# Patient Record
Sex: Female | Born: 1935 | Race: Asian | Hispanic: No | Smoking: Never smoker
Health system: Southern US, Community
[De-identification: ages and names within clinical notes are randomized; demographics above are authoritative.]

## PROBLEM LIST (undated history)

## (undated) DIAGNOSIS — E785 Hyperlipidemia, unspecified: Secondary | ICD-10-CM

## (undated) DIAGNOSIS — Z8542 Personal history of malignant neoplasm of other parts of uterus: Secondary | ICD-10-CM

## (undated) DIAGNOSIS — R159 Full incontinence of feces: Secondary | ICD-10-CM

## (undated) DIAGNOSIS — M8580 Other specified disorders of bone density and structure, unspecified site: Secondary | ICD-10-CM

## (undated) DIAGNOSIS — N3941 Urge incontinence: Secondary | ICD-10-CM

## (undated) DIAGNOSIS — N3281 Overactive bladder: Secondary | ICD-10-CM

## (undated) DIAGNOSIS — N3020 Other chronic cystitis without hematuria: Secondary | ICD-10-CM

## (undated) DIAGNOSIS — Z973 Presence of spectacles and contact lenses: Secondary | ICD-10-CM

## (undated) DIAGNOSIS — H409 Unspecified glaucoma: Secondary | ICD-10-CM

## (undated) DIAGNOSIS — M1990 Unspecified osteoarthritis, unspecified site: Secondary | ICD-10-CM

## (undated) DIAGNOSIS — Z923 Personal history of irradiation: Secondary | ICD-10-CM

## (undated) DIAGNOSIS — I10 Essential (primary) hypertension: Secondary | ICD-10-CM

## (undated) HISTORY — DX: Essential (primary) hypertension: I10

## (undated) HISTORY — PX: OTHER SURGICAL HISTORY: SHX169

## (undated) HISTORY — DX: Other specified disorders of bone density and structure, unspecified site: M85.80

---

## 1988-06-04 HISTORY — PX: TOTAL ABDOMINAL HYSTERECTOMY W/ BILATERAL SALPINGOOPHORECTOMY: SHX83

## 1989-02-02 HISTORY — PX: CATARACT EXTRACTION W/ INTRAOCULAR LENS  IMPLANT, BILATERAL: SHX1307

## 1993-06-04 HISTORY — PX: PATELLA FRACTURE SURGERY: SHX735

## 1997-12-21 ENCOUNTER — Ambulatory Visit (HOSPITAL_COMMUNITY): Admission: RE | Admit: 1997-12-21 | Discharge: 1997-12-21 | Payer: Self-pay | Admitting: Gastroenterology

## 1999-05-23 ENCOUNTER — Other Ambulatory Visit: Admission: RE | Admit: 1999-05-23 | Discharge: 1999-05-23 | Payer: Self-pay | Admitting: Gynecology

## 1999-05-30 ENCOUNTER — Encounter: Payer: Self-pay | Admitting: Gynecology

## 1999-05-30 ENCOUNTER — Encounter: Admission: RE | Admit: 1999-05-30 | Discharge: 1999-05-30 | Payer: Self-pay | Admitting: Gynecology

## 2000-05-30 ENCOUNTER — Encounter: Admission: RE | Admit: 2000-05-30 | Discharge: 2000-05-30 | Payer: Self-pay | Admitting: Gynecology

## 2000-05-30 ENCOUNTER — Encounter: Payer: Self-pay | Admitting: Gynecology

## 2000-06-20 ENCOUNTER — Other Ambulatory Visit: Admission: RE | Admit: 2000-06-20 | Discharge: 2000-06-20 | Payer: Self-pay | Admitting: Gynecology

## 2000-11-25 ENCOUNTER — Ambulatory Visit (HOSPITAL_COMMUNITY): Admission: RE | Admit: 2000-11-25 | Discharge: 2000-11-25 | Payer: Self-pay | Admitting: Gastroenterology

## 2001-06-05 ENCOUNTER — Encounter: Payer: Self-pay | Admitting: Gynecology

## 2001-06-05 ENCOUNTER — Encounter: Admission: RE | Admit: 2001-06-05 | Discharge: 2001-06-05 | Payer: Self-pay | Admitting: Gynecology

## 2001-06-11 ENCOUNTER — Encounter: Admission: RE | Admit: 2001-06-11 | Discharge: 2001-06-11 | Payer: Self-pay | Admitting: Gynecology

## 2001-06-11 ENCOUNTER — Encounter: Payer: Self-pay | Admitting: Gynecology

## 2001-06-17 ENCOUNTER — Encounter: Payer: Self-pay | Admitting: Family Medicine

## 2001-06-17 ENCOUNTER — Encounter: Admission: RE | Admit: 2001-06-17 | Discharge: 2001-06-17 | Payer: Self-pay | Admitting: Family Medicine

## 2001-07-25 ENCOUNTER — Other Ambulatory Visit: Admission: RE | Admit: 2001-07-25 | Discharge: 2001-07-25 | Payer: Self-pay | Admitting: Gynecology

## 2002-06-16 ENCOUNTER — Encounter: Payer: Self-pay | Admitting: Gynecology

## 2002-06-16 ENCOUNTER — Encounter: Admission: RE | Admit: 2002-06-16 | Discharge: 2002-06-16 | Payer: Self-pay | Admitting: Gynecology

## 2002-08-03 ENCOUNTER — Other Ambulatory Visit: Admission: RE | Admit: 2002-08-03 | Discharge: 2002-08-03 | Payer: Self-pay | Admitting: Gynecology

## 2003-07-06 ENCOUNTER — Encounter: Admission: RE | Admit: 2003-07-06 | Discharge: 2003-07-06 | Payer: Self-pay | Admitting: Gynecology

## 2003-09-08 ENCOUNTER — Other Ambulatory Visit: Admission: RE | Admit: 2003-09-08 | Discharge: 2003-09-08 | Payer: Self-pay | Admitting: Gynecology

## 2004-07-10 ENCOUNTER — Encounter: Admission: RE | Admit: 2004-07-10 | Discharge: 2004-07-10 | Payer: Self-pay | Admitting: Gynecology

## 2004-09-11 ENCOUNTER — Other Ambulatory Visit: Admission: RE | Admit: 2004-09-11 | Discharge: 2004-09-11 | Payer: Self-pay | Admitting: Gynecology

## 2005-08-14 ENCOUNTER — Encounter: Admission: RE | Admit: 2005-08-14 | Discharge: 2005-08-14 | Payer: Self-pay | Admitting: Gynecology

## 2005-11-28 ENCOUNTER — Other Ambulatory Visit: Admission: RE | Admit: 2005-11-28 | Discharge: 2005-11-28 | Payer: Self-pay | Admitting: Gynecology

## 2006-08-21 ENCOUNTER — Encounter: Admission: RE | Admit: 2006-08-21 | Discharge: 2006-08-21 | Payer: Self-pay | Admitting: Gynecology

## 2007-01-02 ENCOUNTER — Other Ambulatory Visit: Admission: RE | Admit: 2007-01-02 | Discharge: 2007-01-02 | Payer: Self-pay | Admitting: Gynecology

## 2007-11-14 ENCOUNTER — Encounter: Admission: RE | Admit: 2007-11-14 | Discharge: 2007-11-14 | Payer: Self-pay | Admitting: Neurosurgery

## 2008-01-08 ENCOUNTER — Encounter: Admission: RE | Admit: 2008-01-08 | Discharge: 2008-01-08 | Payer: Self-pay | Admitting: Gynecology

## 2008-06-04 HISTORY — PX: GLAUCOMA SURGERY: SHX656

## 2009-02-03 ENCOUNTER — Encounter: Admission: RE | Admit: 2009-02-03 | Discharge: 2009-02-03 | Payer: Self-pay | Admitting: Gynecology

## 2009-11-01 ENCOUNTER — Encounter: Admission: RE | Admit: 2009-11-01 | Discharge: 2009-11-01 | Payer: Self-pay | Admitting: Family Medicine

## 2010-02-14 ENCOUNTER — Encounter: Admission: RE | Admit: 2010-02-14 | Discharge: 2010-02-14 | Payer: Self-pay | Admitting: Gynecology

## 2010-03-18 ENCOUNTER — Encounter: Admission: RE | Admit: 2010-03-18 | Discharge: 2010-03-18 | Payer: Self-pay | Admitting: Neurosurgery

## 2010-06-25 ENCOUNTER — Encounter: Payer: Self-pay | Admitting: Gynecology

## 2011-01-15 ENCOUNTER — Other Ambulatory Visit: Payer: Self-pay | Admitting: Gynecology

## 2011-01-15 DIAGNOSIS — Z1231 Encounter for screening mammogram for malignant neoplasm of breast: Secondary | ICD-10-CM

## 2011-02-19 ENCOUNTER — Ambulatory Visit
Admission: RE | Admit: 2011-02-19 | Discharge: 2011-02-19 | Disposition: A | Discharge status: Home / Self Care | Payer: Medicare Other | Source: Ambulatory Visit | Attending: Gynecology | Admitting: Gynecology

## 2011-02-19 DIAGNOSIS — Z1231 Encounter for screening mammogram for malignant neoplasm of breast: Secondary | ICD-10-CM

## 2011-12-14 DIAGNOSIS — R152 Fecal urgency: Secondary | ICD-10-CM | POA: Insufficient documentation

## 2011-12-14 DIAGNOSIS — N3946 Mixed incontinence: Secondary | ICD-10-CM | POA: Insufficient documentation

## 2011-12-14 DIAGNOSIS — R159 Full incontinence of feces: Secondary | ICD-10-CM | POA: Insufficient documentation

## 2012-01-29 ENCOUNTER — Other Ambulatory Visit: Payer: Self-pay | Admitting: Gynecology

## 2012-01-29 DIAGNOSIS — Z1231 Encounter for screening mammogram for malignant neoplasm of breast: Secondary | ICD-10-CM

## 2012-02-20 ENCOUNTER — Ambulatory Visit: Payer: Medicare Other

## 2012-03-05 ENCOUNTER — Ambulatory Visit
Admission: RE | Admit: 2012-03-05 | Discharge: 2012-03-05 | Disposition: A | Discharge status: Home / Self Care | Payer: Medicare Other | Source: Ambulatory Visit | Attending: Gynecology | Admitting: Gynecology

## 2012-03-05 DIAGNOSIS — Z1231 Encounter for screening mammogram for malignant neoplasm of breast: Secondary | ICD-10-CM

## 2013-02-13 DIAGNOSIS — M542 Cervicalgia: Secondary | ICD-10-CM | POA: Insufficient documentation

## 2013-02-20 ENCOUNTER — Other Ambulatory Visit: Payer: Self-pay

## 2013-02-20 DIAGNOSIS — Z1231 Encounter for screening mammogram for malignant neoplasm of breast: Secondary | ICD-10-CM

## 2013-03-11 ENCOUNTER — Ambulatory Visit
Admission: RE | Admit: 2013-03-11 | Discharge: 2013-03-11 | Disposition: A | Discharge status: Home / Self Care | Payer: Medicare PPO | Source: Ambulatory Visit

## 2013-03-11 DIAGNOSIS — Z1231 Encounter for screening mammogram for malignant neoplasm of breast: Secondary | ICD-10-CM

## 2013-03-26 ENCOUNTER — Ambulatory Visit (INDEPENDENT_AMBULATORY_CARE_PROVIDER_SITE_OTHER): Payer: Medicare PPO | Admitting: Gynecology

## 2013-03-26 ENCOUNTER — Encounter: Payer: Self-pay | Admitting: Gynecology

## 2013-03-26 ENCOUNTER — Other Ambulatory Visit (HOSPITAL_COMMUNITY)
Admission: RE | Admit: 2013-03-26 | Discharge: 2013-03-26 | Disposition: A | Discharge status: Home / Self Care | Payer: Medicare PPO | Source: Ambulatory Visit | Attending: Gynecology | Admitting: Gynecology

## 2013-03-26 VITALS — BP 124/76 | Ht 61.0 in | Wt 126.0 lb

## 2013-03-26 DIAGNOSIS — K6289 Other specified diseases of anus and rectum: Secondary | ICD-10-CM

## 2013-03-26 DIAGNOSIS — M899 Disorder of bone, unspecified: Secondary | ICD-10-CM

## 2013-03-26 DIAGNOSIS — M8580 Other specified disorders of bone density and structure, unspecified site: Secondary | ICD-10-CM

## 2013-03-26 DIAGNOSIS — Z124 Encounter for screening for malignant neoplasm of cervix: Secondary | ICD-10-CM | POA: Insufficient documentation

## 2013-03-26 DIAGNOSIS — Z1272 Encounter for screening for malignant neoplasm of vagina: Secondary | ICD-10-CM

## 2013-03-26 DIAGNOSIS — N816 Rectocele: Secondary | ICD-10-CM

## 2013-03-26 DIAGNOSIS — N952 Postmenopausal atrophic vaginitis: Secondary | ICD-10-CM

## 2013-03-26 DIAGNOSIS — K627 Radiation proctitis: Secondary | ICD-10-CM

## 2013-03-26 NOTE — Progress Notes (Signed)
Janet Cooper Sep 09, 1929 [id number]        79 y.o.  G1P1 new patient for followup exam.  Several issues noted below. Former patient Dr. Nicholas Lose  Past medical history,surgical history, medications, allergies, family history and social history were all reviewed and documented in the EPIC chart.  ROS:  Performed and pertinent positives and negatives are included in the history, assessment and plan .  Exam: Kim assistant Filed Vitals:   03/26/13 1124  BP: 124/76  Height: 5\' 1"  (1.549 m)  Weight: 126 lb (57.153 kg)   General appearance  Normal Skin grossly normal Head/Neck normal with no cervical or supraclavicular adenopathy thyroid normal Lungs  clear Cardiac RR, without RMG Abdominal  soft, nontender, without masses, organomegaly or hernia Breasts  examined lying and sitting without masses, retractions, discharge or axillary adenopathy. Pelvic  Ext/BUS/vagina  atrophic changes with foreshortened vagina. Mild rectocele noted  Adnexa  Without masses or tenderness    Anus and perineum  normal   Rectovaginal  normal sphincter tone without palpated masses or tenderness.    Assessment/Plan:  79 y.o. G1P1 new patient   1. Patient with vague history of cancer treatment initially with surgery 1989 and subsequently 5 years later resulting in radiation treatment. York Spaniel it was a cancer outside her ovary. Did write down a name endometrial cancer. It is unclear otherwise about the diagnosis. She's also unclear whether she still has her ovaries or not. She is status post hysterectomy in the past. Question whether she had initial endometrial carcinoma treated surgically and then had a vaginal cuff recurrence leading to radiation possibly 5 years later. Will obtain records from Dr. Nicholas Lose to see if we can clarify this history. Pap smear of her cuff was done today. Exam shows a shortened vagina with atrophic changes but NED. 2. Rectocele. Patient has a mild rectocele on exam. Asymptomatic to the  patient. 3. Radiation proctitis. It sounds historically like she's having radiation bowel changes were she's having frequent liquid to small stooling difficulty maintaining continence. She did undergo sphincter testing at [facility] which overall was which showed some dyskinetic changes but overall normal. She does have adequate sphincter tone on exam today. She reports being told by her gastroenterologist that nothing more could be done for her. I discussed with her that I would recommend either seeing a different gastroenterologist or possibly a radiation oncologist to see what can be done possibly to help treat her. I will plan to help arrange an appointment with the radiation oncologist to see what they can suggest. 4. Osteopenia/porosis. Patient relates having a bone density at Dr. Johnn Hai office 5 years ago where she was told she had weak bones and is on no medication. Will obtain a copy of this I recommended baseline DEXA now and she will schedule this. 5. Mammography 03/2013. Continue with annual mammography. 6. Pap smear done today as noted above. 7. Colonoscopy 8 years ago with recommended repeat at 10 year interval. 8. Health maintenance. The blood work done as this is done through her primary physician's office. I will try to obtain records from Dr. Johnn Hai office otherwise patient will followup in one year, sooner as needed.    Note: This document was prepared with digital dictation and possible smart phrase technology. Any transcriptional errors that result from this process are unintentional.   Dara Lords MD, 12:28 PM 03/26/2013

## 2013-03-26 NOTE — Patient Instructions (Signed)
Followup for bone density as scheduled. Office will call you to help arrange a follow up appointment with the other doctor in reference to your bowels

## 2013-03-26 NOTE — Addendum Note (Signed)
Addended by: Dayna Barker on: 03/26/2013 12:49 PM   Modules accepted: Orders

## 2013-03-27 LAB — URINALYSIS W MICROSCOPIC + REFLEX CULTURE
Bilirubin Urine: NEGATIVE
Casts: NONE SEEN
Crystals: NONE SEEN
Glucose, UA: NEGATIVE mg/dL
Ketones, ur: NEGATIVE mg/dL
Nitrite: NEGATIVE
Protein, ur: NEGATIVE mg/dL
Specific Gravity, Urine: 1.01 (ref 1.005–1.030)
Urobilinogen, UA: 0.2 mg/dL (ref 0.0–1.0)
pH: 5.5 (ref 5.0–8.0)

## 2013-03-29 LAB — URINE CULTURE: Colony Count: >=100000

## 2013-03-30 ENCOUNTER — Other Ambulatory Visit: Payer: Self-pay | Admitting: *Deleted

## 2013-03-30 MED ORDER — CIPROFLOXACIN HCL 250 MG PO TABS: 250.0000 mg | ORAL_TABLET | Freq: Two times a day (BID) | ORAL | Status: DC

## 2013-04-02 ENCOUNTER — Telehealth: Payer: Self-pay | Admitting: *Deleted

## 2013-04-02 DIAGNOSIS — K627 Radiation proctitis: Secondary | ICD-10-CM

## 2013-04-02 NOTE — Telephone Encounter (Signed)
Pt informed with the below note. 

## 2013-04-02 NOTE — Telephone Encounter (Signed)
Left message for pt to call. Appointment on Nov. 6 1:30 pm with Dr.Kinard at [facility].

## 2013-04-02 NOTE — Telephone Encounter (Signed)
Message copied by Aura Camps on Thu Apr 02, 2013  8:58 AM ------      Message from: Keenan Bachelor      Created: Tue Mar 31, 2013  9:18 AM       This is referral. Thanks!      ----- Message -----         From: Dara Lords, MD         Sent: 03/26/2013  12:34 PM           To: Christie Nottingham, see if you can call radiation oncology Department and asked them about this patient. She has a history of radiation treatment to the pelvis a number of years ago done elsewhere I believe and I think she now is having issues with radiation proctitis. She saw a gastroenterologist who really was no help to her and I was thinking that one of the radiation oncologists may have better treatment options to help her. If they have someone who would be willing to see her then help the patient make an appointment. If they feel they cannot help her then I suggest an appointment with Dr. Charlotte Sanes at [facility] gastroenterology       ------

## 2013-04-06 ENCOUNTER — Encounter: Payer: Self-pay | Admitting: Gynecology

## 2013-04-08 ENCOUNTER — Telehealth: Payer: Self-pay | Admitting: *Deleted

## 2013-04-08 NOTE — Telephone Encounter (Signed)
Clydie Braun from Dr.Kinard office called stating that Dr.kinard reviewed pt chart and said pt would better seeing a GI doctor, I will make referral for Dr. Charlotte Sanes at [facility] gastroenterology as directed by TF.

## 2013-04-09 ENCOUNTER — Ambulatory Visit: Payer: Medicare PPO

## 2013-04-09 ENCOUNTER — Ambulatory Visit: Payer: Medicare PPO | Admitting: Radiation Oncology

## 2013-04-09 NOTE — Telephone Encounter (Signed)
Spoke with Toni Amend at [facility] and Dr.Buccnini is no longer accepting new patients, TF said okay with any GI at [facility] except Dr.Ganman. Courtney asked if we had copy of recent colonoscopy, I told her we do not have this information. I gave her pt daughter # to call to see if records could be sent. Toni Amend is going to call me back once she speaks with pt daughter who has DPR access.

## 2013-04-10 NOTE — Telephone Encounter (Signed)
I was informed by patient that she saw Dr.medoff, eagle will need records from [facility] office before making appointment with new GI Md. Pt will have records sent to [facility] one of there physician will review records to determine if they can help pt with problem before making appointment. Pt is aware with all the above.

## 2013-05-04 DIAGNOSIS — M8580 Other specified disorders of bone density and structure, unspecified site: Secondary | ICD-10-CM

## 2013-05-04 HISTORY — DX: Other specified disorders of bone density and structure, unspecified site: M85.80

## 2013-05-12 ENCOUNTER — Encounter: Payer: Self-pay | Admitting: Gynecology

## 2013-05-12 ENCOUNTER — Ambulatory Visit (INDEPENDENT_AMBULATORY_CARE_PROVIDER_SITE_OTHER): Payer: Medicare PPO

## 2013-05-12 DIAGNOSIS — M899 Disorder of bone, unspecified: Secondary | ICD-10-CM

## 2013-05-12 DIAGNOSIS — M8580 Other specified disorders of bone density and structure, unspecified site: Secondary | ICD-10-CM

## 2013-05-15 NOTE — Telephone Encounter (Signed)
shawna called from [facility] physician regarding the below and said that 2 physician have declined to see patient. Stating that they would not be able to help pt. She said that Dr. Laural Benes states pt should be seen by one of the [facility] physician. I was unable to let pt know about this information because pt does not have a voicemail to leave message.

## 2013-05-18 NOTE — Telephone Encounter (Signed)
(  Late entry 05/15/13)  Pt was informed with the below note, pt decided she would just rather continue care with [facility] office being that he is her originally GI MD. I called Dr.Medoff office to inform office of this information and was told that pt has been discharged from practice. I explained to front desk that this was a second opinion only, I was informed that Dr.Meoff will be informed with this only being second opinion and to see if pt continue seeing MD. I will wait for return call from office.

## 2013-05-19 NOTE — Telephone Encounter (Signed)
Westly Pam from [facility] called back after speaking with him personally regarding the below note. He said that once a pt has been discharged from practice there is no return. Westly Pam explained to MD it was more so a second opinion, MD still declined. Pt was informed with this a well, she is going to consult with her PCP MD to help with GI issues if possible. Pt informed if any problem. She once again declined to see physician at [facility] stating the drive is too long and she doesn't want to do the testing [facility] asked.

## 2013-06-03 ENCOUNTER — Emergency Department (HOSPITAL_COMMUNITY): Payer: Medicare PPO

## 2013-06-03 ENCOUNTER — Emergency Department (HOSPITAL_COMMUNITY)
Admission: EM | Admit: 2013-06-03 | Discharge: 2013-06-03 | Disposition: A | Discharge status: Home / Self Care | Payer: Medicare PPO | Attending: Emergency Medicine | Admitting: Emergency Medicine

## 2013-06-03 ENCOUNTER — Encounter (HOSPITAL_COMMUNITY): Payer: Self-pay | Admitting: Emergency Medicine

## 2013-06-03 DIAGNOSIS — Z8542 Personal history of malignant neoplasm of other parts of uterus: Secondary | ICD-10-CM | POA: Insufficient documentation

## 2013-06-03 DIAGNOSIS — Z8544 Personal history of malignant neoplasm of other female genital organs: Secondary | ICD-10-CM | POA: Insufficient documentation

## 2013-06-03 DIAGNOSIS — M899 Disorder of bone, unspecified: Secondary | ICD-10-CM | POA: Insufficient documentation

## 2013-06-03 DIAGNOSIS — S4980XA Other specified injuries of shoulder and upper arm, unspecified arm, initial encounter: Secondary | ICD-10-CM | POA: Insufficient documentation

## 2013-06-03 DIAGNOSIS — Y9389 Activity, other specified: Secondary | ICD-10-CM | POA: Insufficient documentation

## 2013-06-03 DIAGNOSIS — E7800 Pure hypercholesterolemia, unspecified: Secondary | ICD-10-CM | POA: Insufficient documentation

## 2013-06-03 DIAGNOSIS — Y92009 Unspecified place in unspecified non-institutional (private) residence as the place of occurrence of the external cause: Secondary | ICD-10-CM | POA: Insufficient documentation

## 2013-06-03 DIAGNOSIS — Z79899 Other long term (current) drug therapy: Secondary | ICD-10-CM | POA: Insufficient documentation

## 2013-06-03 DIAGNOSIS — S46909A Unspecified injury of unspecified muscle, fascia and tendon at shoulder and upper arm level, unspecified arm, initial encounter: Secondary | ICD-10-CM | POA: Insufficient documentation

## 2013-06-03 DIAGNOSIS — Z7982 Long term (current) use of aspirin: Secondary | ICD-10-CM | POA: Insufficient documentation

## 2013-06-03 DIAGNOSIS — M25511 Pain in right shoulder: Secondary | ICD-10-CM

## 2013-06-03 DIAGNOSIS — W010XXA Fall on same level from slipping, tripping and stumbling without subsequent striking against object, initial encounter: Secondary | ICD-10-CM | POA: Insufficient documentation

## 2013-06-03 DIAGNOSIS — W19XXXA Unspecified fall, initial encounter: Secondary | ICD-10-CM

## 2013-06-03 DIAGNOSIS — I10 Essential (primary) hypertension: Secondary | ICD-10-CM | POA: Insufficient documentation

## 2013-06-03 MED ORDER — IBUPROFEN 600 MG PO TABS: 600.0000 mg | ORAL_TABLET | Freq: Three times a day (TID) | ORAL | Status: DC | PRN

## 2013-06-03 MED ORDER — IBUPROFEN 200 MG PO TABS
600.0000 mg | ORAL_TABLET | Freq: Once | ORAL | Status: AC
  Administered 2013-06-03: 600 mg via ORAL
  Filled 2013-06-03: qty 3

## 2013-06-03 NOTE — ED Provider Notes (Signed)
CSN: [id number]     Arrival date & time 06/03/13  1343 History  This chart was scribed for non-physician practitioner, Trixie Dredge, PA-C working with Rolan Bucco, MD by Greggory Stallion, ED scribe. This patient was seen in room WTR5/WTR5 and the patient's care was started at 4:22 PM.   Chief Complaint  Patient presents with  . Shoulder Pain   The history is provided by the patient. No language interpreter was used.   HPI Comments: Janet Cooper is a 79 y.o. female who presents to the Emergency Department complaining of a fall that occurred earlier today around 10 AM. States she tripped over a cord, fell and landed on her right shoulder. Denies hitting her head or LOC. She has sudden onset right shoulder pain. Pt rates the pain 7/10. Certain movements worsen the pain. Denies chest pain, abdominal pain, hip pain, elbow pain, neck pain, back pain, weakness or numbness in hand.   Past Medical History  Diagnosis Date  . Hypertension   . Elevated cholesterol   . Osteopenia 05/2013    T score -2.1 FRAX 8.9%/2.5%  . Endometrial cancer 1990    Stage IB grade 1 TAH BSO  . Vaginal cancer 1994    Vaginal cuff recurrence of endometrial cancer status post radiation treatment   Past Surgical History  Procedure Laterality Date  . Patella fracture surgery    . Cataract extraction    . Abdominal hysterectomy     Family History  Problem Relation Age of Onset  . Colon cancer Mother   . Cancer Father     Liver cancer  . Heart disease Maternal Grandmother   . Heart disease Maternal Grandfather    History  Substance Use Topics  . Smoking status: Never Smoker   . Smokeless tobacco: Not on file  . Alcohol Use: 0.5 oz/week    1 drink(s) per week     Comment: Occas   OB History   Grav Para Term Preterm Abortions TAB SAB Ect Mult Living   1 1        1      Review of Systems  Cardiovascular: Negative for chest pain.  Gastrointestinal: Negative for abdominal pain.  Musculoskeletal: Positive  for arthralgias. Negative for back pain and neck pain.  Neurological: Negative for weakness and numbness.  All other systems reviewed and are negative.    Allergies  Hydrocodone and Sulfa antibiotics  Home Medications   Current Outpatient Rx  Name  Route  Sig  Dispense  Refill  . amLODipine-olmesartan (AZOR) 5-40 MG per tablet   Oral   Take 1 tablet by mouth daily.         Marland Kitchen aspirin 81 MG tablet   Oral   Take 81 mg by mouth daily.         . calcium-vitamin D (OSCAL WITH D) 500-200 MG-UNIT per tablet   Oral   Take 1 tablet by mouth 2 (two) times daily.         . cholecalciferol (VITAMIN D) 1000 UNITS tablet   Oral   Take 2,000 Units by mouth daily.         Marland Kitchen glucosamine-chondroitin 500-400 MG tablet   Oral   Take 1 tablet by mouth 2 (two) times daily.         Marland Kitchen latanoprost (XALATAN) 0.005 % ophthalmic solution      1 drop at bedtime.         . meloxicam (MOBIC) 7.5 MG tablet   Oral  Take 7.5 mg by mouth as needed for pain.          . Omega-3 Fatty Acids (FISH OIL PO)   Oral   Take 1 g by mouth 2 (two) times daily.          . simvastatin (ZOCOR) 20 MG tablet   Oral   Take 20 mg by mouth every evening.         . timolol (BETIMOL) 0.5 % ophthalmic solution      1 drop 2 (two) times daily.          BP 134/84  Pulse 67  Temp(Src) 98.6 F (37 C) (Oral)  Resp 20  SpO2 95%  Physical Exam  Nursing note and vitals reviewed. Constitutional: She appears well-developed and well-nourished. No distress.  HENT:  Head: Normocephalic and atraumatic.  Neck: Neck supple.  Cardiovascular: Intact distal pulses.   Capillary refill less than 2 seconds.   Pulmonary/Chest: Effort normal.  Musculoskeletal:  Full active ROM of right shoulder and elbow. Spine nontender, no crepitus, or stepoffs. Pain with internal rotation of right shoulder. Pain with adduction.   Neurological: She is alert.  Sensation intact.  Skin: She is not diaphoretic.    ED  Course  Procedures (including critical care time)  DIAGNOSTIC STUDIES: Oxygen Saturation is 95% on RA, adequate by my interpretation.    COORDINATION OF CARE: 4:28 PM-Discussed treatment plan which includes an anti-inflammatory and ice packs with pt at bedside and pt agreed to plan.   Labs Review Labs Reviewed - No data to display Imaging Review Dg Shoulder Right  06/03/2013   CLINICAL DATA:  Fall with right shoulder injury.  EXAM: RIGHT SHOULDER - 2+ VIEW  COMPARISON:  None.  FINDINGS: There is no evidence of fracture or dislocation. There is no evidence of arthropathy or other focal bone abnormality. Soft tissues are unremarkable. Subacromial morphology is type 2 (curved).  IMPRESSION: 1. No acute bony findings. If pain persists despite conservative therapy, MRI may be warranted for further characterization.   Electronically Signed   By: Herbie Baltimore M.D.   On: 06/03/2013 15:27    EKG Interpretation   None      Dr Fredderick Phenix made aware of the patient  MDM   1. Fall at home, initial encounter   2. Right shoulder pain    Patient with mechanical fall this morning landing on her right shoulder. Patient denies any other injury. Neurovascularly intact. X-ray shows no fracture. Patient has full active range of motion and has pain only with certain movements. Discharged home with ibuprofen and PCP followup  Discussed all results with patient.  Pt given return precautions.  Pt verbalizes understanding and agrees with plan.     I personally performed the services described in this documentation, which was scribed in my presence. The recorded information has been reviewed and is accurate.   Trixie Dredge, PA-C 06/03/13 1756

## 2013-06-03 NOTE — Progress Notes (Signed)
   CARE MANAGEMENT ED NOTE 06/03/2013  Patient:  Janet Cooper, Janet Cooper   Account Number:  [id number]  Date Initiated:  06/03/2013  Documentation initiated by:  Edd Arbour  Subjective/Objective Assessment:   79 yr old female [employer] choice ppo pt states pcp is Dr Warrick Parisian a [facility] in [place]     Subjective/Objective Assessment Detail:     Action/Plan:   Action/Plan Detail:   Anticipated DC Date:  06/03/2013     Status Recommendation to Physician:   Result of Recommendation:    Other ED Services  Consult Working Plan    DC Planning Services  Other  PCP issues  Outpatient Services - Pt will follow up    Choice offered to / List presented to:            Status of service:  Completed, signed off  ED Comments:   ED Comments Detail:

## 2013-06-03 NOTE — ED Provider Notes (Signed)
Medical screening examination/treatment/procedure(s) were conducted as a shared visit with non-physician practitioner(s) and myself.  I personally evaluated the patient during the encounter.  EKG Interpretation   None       Pt with right shoulder pain s/p mechanical fall.  No evident head injury.  No neck pain other then baseline.   Rolan Bucco, MD 06/03/13 [phone]

## 2013-06-03 NOTE — ED Notes (Addendum)
Pt c/o R shoulder pain after tripping, fall and landing on shoulder.  Pain score 6/10, with activity.  No swelling or deformity noted.  Denies numbness and tingling.  Pt has full ROM, with slow movements.

## 2013-07-14 ENCOUNTER — Ambulatory Visit (INDEPENDENT_AMBULATORY_CARE_PROVIDER_SITE_OTHER): Payer: Medicare PPO | Admitting: General Surgery

## 2013-07-16 ENCOUNTER — Ambulatory Visit (INDEPENDENT_AMBULATORY_CARE_PROVIDER_SITE_OTHER): Payer: Medicare PPO | Admitting: General Surgery

## 2013-07-16 ENCOUNTER — Encounter (INDEPENDENT_AMBULATORY_CARE_PROVIDER_SITE_OTHER): Payer: Self-pay

## 2013-07-16 ENCOUNTER — Encounter (INDEPENDENT_AMBULATORY_CARE_PROVIDER_SITE_OTHER): Payer: Self-pay | Admitting: General Surgery

## 2013-07-16 DIAGNOSIS — R152 Fecal urgency: Secondary | ICD-10-CM

## 2013-07-16 NOTE — Progress Notes (Signed)
Chief Complaint  Patient presents with  . Other    Eval bowel motilty    HISTORY: Janet Cooper is a 80 y.o. female who presents to the office with fecal urgency.  This had been occurring for 6-7 years.  She explain this as a urgency to have bowel movements that is almost constant. She does have a bowel movement she only has a small amount of stool. She also reports a sensation of incomplete evacuation. If she is unable to get to the bathroom in time she does have some fecal leakage.  This affects her lifestyle, as she is unable to leave the house very often.   Nothing makes the symptoms worse.  In the past she has tried fiber therapy and anti-diarrheal medications.  Neither of these have helped.  It is continuous in nature.  Her bowel habits are irregular and her bowel movements are soft.  Her fiber intake is dietary.  Her last colonoscopy was about 8 yrs ago.  She has had 1 vaginal deliveries, which was complicated by a laceration.  She denies any symptoms of external rectal prolapse.  Past Medical History  Diagnosis Date  . Hypertension   . Elevated cholesterol   . Osteopenia 05/2013    T score -2.1 FRAX 8.9%/2.5%  . Endometrial cancer 1990    Stage IB grade 1 TAH BSO  . Vaginal cancer 1994    Vaginal cuff recurrence of endometrial cancer status post radiation treatment      Past Surgical History  Procedure Laterality Date  . Patella fracture surgery    . Cataract extraction    . Abdominal hysterectomy          Current Outpatient Prescriptions  Medication Sig Dispense Refill  . amLODipine-olmesartan (AZOR) 5-40 MG per tablet Take 1 tablet by mouth daily.      Marland Kitchen aspirin 81 MG tablet Take 81 mg by mouth daily.      . calcium-vitamin D (OSCAL WITH D) 500-200 MG-UNIT per tablet Take 1 tablet by mouth 2 (two) times daily.      . cholecalciferol (VITAMIN D) 1000 UNITS tablet Take 2,000 Units by mouth daily.      Marland Kitchen glucosamine-chondroitin 500-400 MG tablet Take 1 tablet by mouth 2  (two) times daily.      Marland Kitchen ibuprofen (ADVIL,MOTRIN) 600 MG tablet Take 1 tablet (600 mg total) by mouth every 8 (eight) hours as needed for mild pain or moderate pain.  15 tablet  0  . latanoprost (XALATAN) 0.005 % ophthalmic solution 1 drop at bedtime.      . Omega-3 Fatty Acids (FISH OIL PO) Take 1 g by mouth 2 (two) times daily.       . simvastatin (ZOCOR) 20 MG tablet Take 20 mg by mouth every evening.      . timolol (BETIMOL) 0.5 % ophthalmic solution 1 drop 2 (two) times daily.      . meloxicam (MOBIC) 7.5 MG tablet Take 7.5 mg by mouth as needed for pain.        No current facility-administered medications for this visit.      Allergies  Allergen Reactions  . Hydrocodone Itching  . Sulfa Antibiotics Itching      Family History  Problem Relation Age of Onset  . Colon cancer Mother   . Cancer Father     Liver cancer  . Heart disease Maternal Grandmother   . Heart disease Maternal Grandfather     History   Social History  .  Marital Status: Widowed    Spouse Name: N/A    Number of Children: N/A  . Years of Education: N/A   Social History Main Topics  . Smoking status: Never Smoker   . Smokeless tobacco: None  . Alcohol Use: 0.5 oz/week    1 drink(s) per week     Comment: Occas  . Drug Use: No  . Sexual Activity: No   Other Topics Concern  . None   Social History Narrative  . None      REVIEW OF SYSTEMS - PERTINENT POSITIVES ONLY: Review of Systems - General ROS: negative for - chills, fever or weight loss Hematological and Lymphatic ROS: negative for - bleeding problems, blood clots or bruising Respiratory ROS: no cough, shortness of breath, or wheezing Cardiovascular ROS: no chest pain or dyspnea on exertion Gastrointestinal ROS: no abdominal pain, change in bowel habits, or black or bloody stools Genito-Urinary ROS: no dysuria, trouble voiding, or hematuria  EXAM: Filed Vitals:   07/16/13 1352  BP: 182/82  Pulse: 76  Resp: 16    General  appearance: alert and cooperative Resp: clear to auscultation bilaterally Cardio: regular rate and rhythm GI: normal findings: soft, non-tender   Procedure: Anoscopy Surgeon: Marcello Moores Diagnosis: fecal urgency  Assistant: Spillers After the risks and benefits were explained, verbal consent was obtained for above procedure  Anesthesia: none Findings: Moderate anal tone, short sphincter complex, moderate squeeze, good push. Upon pushing I did notice tissue prolapsing, concerning for internal rectal prolapse. Internal tissue appears non-inflamed. Small rectocele  Her anal rectal manometry performed last year weight 4 she diversity shows mild sphincter dysfunction. Squeeze pressures were adequate. Resting tone was mildly decreased. She did have some increased push pressures.  ASSESSMENT AND PLAN: Janet Cooper Is an 80 year old female who presents to the office with ongoing problems with urgency. She does have a history of radiation to the pelvis. I did not see any signs of radiation proctitis on exam. Her exam is concerning for an internal rectal prolapse.  I think this would account for some of her urgency issues.  These may also be caused by her rectocele filling with stool, although this seems less likely.  I would like to get an MR Defogram to evaluate this further.  I have recommended that she go back on a fiber supplement. I will see her once her MRI is complete and we will evaluate the need for surgery.    Janet Cooper [facility] / [facility]      Visit Diagnoses: 1. Fecal urgency     Primary Care Physician: Janet Cooper

## 2013-07-16 NOTE — Patient Instructions (Signed)

## 2013-07-27 ENCOUNTER — Telehealth (INDEPENDENT_AMBULATORY_CARE_PROVIDER_SITE_OTHER): Payer: Self-pay

## 2013-07-27 ENCOUNTER — Telehealth (INDEPENDENT_AMBULATORY_CARE_PROVIDER_SITE_OTHER): Payer: Self-pay | Admitting: *Deleted

## 2013-07-27 NOTE — Telephone Encounter (Signed)
I spoke with pt and informed her of the appt for her MRI at [facility] on 08/03/13 with an arrival time of 1:15pm.  She is agreeable with this appt.  This MRI has been pre-authorized valid from 08/03/13-09/02/13 Auth#: [id number]

## 2013-07-27 NOTE — Telephone Encounter (Signed)
Left message on voice mail.  Please inform patient her MRI has been scheduled for 08/03/13 at 1:45pm at [facility].

## 2013-08-03 ENCOUNTER — Ambulatory Visit
Admission: RE | Admit: 2013-08-03 | Discharge: 2013-08-03 | Disposition: A | Discharge status: Home / Self Care | Payer: Medicare PPO | Source: Ambulatory Visit | Attending: General Surgery | Admitting: General Surgery

## 2013-08-07 ENCOUNTER — Telehealth (INDEPENDENT_AMBULATORY_CARE_PROVIDER_SITE_OTHER): Payer: Self-pay | Admitting: *Deleted

## 2013-08-07 NOTE — Telephone Encounter (Signed)
MRI results are available in Epic.  Awaiting review from Dr. Marcello Moores and recommendations.

## 2013-08-09 NOTE — Telephone Encounter (Signed)
We will discuss results at her f/u apt

## 2013-08-11 ENCOUNTER — Encounter (INDEPENDENT_AMBULATORY_CARE_PROVIDER_SITE_OTHER): Payer: Self-pay

## 2013-09-16 ENCOUNTER — Encounter (INDEPENDENT_AMBULATORY_CARE_PROVIDER_SITE_OTHER): Payer: Self-pay | Admitting: General Surgery

## 2013-09-16 ENCOUNTER — Ambulatory Visit (INDEPENDENT_AMBULATORY_CARE_PROVIDER_SITE_OTHER): Payer: Medicare PPO | Admitting: General Surgery

## 2013-09-16 ENCOUNTER — Other Ambulatory Visit (INDEPENDENT_AMBULATORY_CARE_PROVIDER_SITE_OTHER): Payer: Self-pay

## 2013-09-16 VITALS — BP 122/72 | HR 76 | Temp 97.0°F | Resp 14 | Ht 61.0 in | Wt 123.2 lb

## 2013-09-16 DIAGNOSIS — IMO0002 Reserved for concepts with insufficient information to code with codable children: Secondary | ICD-10-CM

## 2013-09-16 DIAGNOSIS — N816 Rectocele: Secondary | ICD-10-CM | POA: Insufficient documentation

## 2013-09-16 NOTE — Progress Notes (Signed)
Janet Cooper is a 80 y.o. female who is here for a follow up visit regarding Her prolapse symptoms. She reports multiple bowel movements throughout the day, which don't really bother her too much. Her main complaint is the feeling of pressure and urgency when she stands. She has tried a fiber supplement for the past couple months without any relief in her bowel habits.  She is also having urinary symptoms with urgency.  Objective: Filed Vitals:   09/16/13 1045  BP: 122/72  Pulse: 76  Temp: 97 F (36.1 C)  Resp: 14    General appearance: alert and cooperative GI: normal findings: soft, non-tender   Assessment and Plan: Janet Cooper is an 80 year old female with pelvic floor laxity. Pelvic MRI shows a moderate rectocele with vaginal prolapse and cystocele as well as a small enterocele. We discussed that repairing her pelvic floor would be difficulty as all compartments seem to be involved. I would like her to see Dr. Matilde Sprang at [facility] urology for evaluation of her anterior compartment issues. I think if he feels that there is something that can be done for anterior compartment we could attempt to close the rectocele and enterocele surgically as well. This may help with her symptoms.    Janet Adie, MD [facility] [place]

## 2013-09-16 NOTE — Patient Instructions (Signed)
We will refer you to the pelvic floor urologist for evaluation of her pelvic floor relapse. Return to my office once you have seen him and he has assessed your urinary problems.

## 2013-11-25 ENCOUNTER — Ambulatory Visit (INDEPENDENT_AMBULATORY_CARE_PROVIDER_SITE_OTHER): Payer: Medicare PPO | Admitting: General Surgery

## 2013-11-25 ENCOUNTER — Encounter (INDEPENDENT_AMBULATORY_CARE_PROVIDER_SITE_OTHER): Payer: Self-pay | Admitting: General Surgery

## 2013-11-25 VITALS — BP 122/76 | HR 77 | Temp 97.5°F | Ht 61.0 in | Wt 123.0 lb

## 2013-11-25 DIAGNOSIS — N816 Rectocele: Secondary | ICD-10-CM

## 2013-11-25 NOTE — Progress Notes (Signed)
Janet Cooper is a 80 y.o. female who is here for a follow up visit regarding Her prolapse symptoms. She reports multiple bowel movements throughout the day, which don't really bother her too much. Her main complaint is the feeling of pressure and urgency when she stands. She has tried a fiber supplement for the past couple months without any relief in her bowel habits. She is also having urinary symptoms with urgency. She saw Dr Jeffie Pollock for this who recommended chronic abx for UTI and pelvic floor PT.  She is scheduled for this next month. Objective:  Filed Vitals:   11/25/13 1535  BP: 122/76  Pulse: 77  Temp: 97.5 F (36.4 C)    General appearance: alert and cooperative  GI: normal findings: soft, non-tender  Assessment and Plan:  Janet Cooper is an 80 year old female with pelvic floor laxity. Pelvic MRI shows a moderate rectocele with vaginal prolapse and cystocele as well as a small enterocele. We discussed that repairing her pelvic floor would be difficulty as all compartments seem to be involved. I would like her to see [facility] for PT.  I will see her back after that.   Rosario Adie, MD  [facility], [place]

## 2013-11-25 NOTE — Patient Instructions (Signed)
Return to the office in 3 months 

## 2014-02-12 ENCOUNTER — Other Ambulatory Visit: Payer: Self-pay

## 2014-02-12 DIAGNOSIS — Z1231 Encounter for screening mammogram for malignant neoplasm of breast: Secondary | ICD-10-CM

## 2014-03-17 ENCOUNTER — Ambulatory Visit
Admission: RE | Admit: 2014-03-17 | Discharge: 2014-03-17 | Disposition: A | Discharge status: Home / Self Care | Payer: Medicare PPO | Source: Ambulatory Visit

## 2014-03-17 DIAGNOSIS — Z1231 Encounter for screening mammogram for malignant neoplasm of breast: Secondary | ICD-10-CM

## 2014-03-19 ENCOUNTER — Other Ambulatory Visit: Payer: Self-pay | Admitting: Gynecology

## 2014-03-19 DIAGNOSIS — R928 Other abnormal and inconclusive findings on diagnostic imaging of breast: Secondary | ICD-10-CM

## 2014-03-25 ENCOUNTER — Other Ambulatory Visit: Payer: Self-pay

## 2014-03-25 ENCOUNTER — Other Ambulatory Visit: Payer: Self-pay | Admitting: Gynecology

## 2014-03-25 DIAGNOSIS — R928 Other abnormal and inconclusive findings on diagnostic imaging of breast: Secondary | ICD-10-CM

## 2014-03-29 ENCOUNTER — Ambulatory Visit (HOSPITAL_COMMUNITY)
Admission: RE | Admit: 2014-03-29 | Discharge: 2014-03-29 | Disposition: A | Discharge status: Home / Self Care | Payer: Medicare PPO | Source: Ambulatory Visit | Attending: General Surgery | Admitting: General Surgery

## 2014-03-29 ENCOUNTER — Encounter: Payer: Medicare PPO | Admitting: Gynecology

## 2014-03-29 ENCOUNTER — Encounter (HOSPITAL_COMMUNITY)
Admission: RE | Disposition: A | Discharge status: Home / Self Care | Payer: Self-pay | Source: Ambulatory Visit | Attending: General Surgery

## 2014-03-29 DIAGNOSIS — R159 Full incontinence of feces: Secondary | ICD-10-CM | POA: Insufficient documentation

## 2014-03-29 HISTORY — PX: ANAL RECTAL MANOMETRY: SHX6358

## 2014-03-29 SURGERY — MANOMETRY, ANORECTAL

## 2014-03-30 ENCOUNTER — Encounter (HOSPITAL_COMMUNITY): Payer: Self-pay | Admitting: General Surgery

## 2014-03-31 ENCOUNTER — Ambulatory Visit
Admission: RE | Admit: 2014-03-31 | Discharge: 2014-03-31 | Disposition: A | Discharge status: Home / Self Care | Payer: Medicare PPO | Source: Ambulatory Visit | Attending: Gynecology | Admitting: Gynecology

## 2014-03-31 DIAGNOSIS — R928 Other abnormal and inconclusive findings on diagnostic imaging of breast: Secondary | ICD-10-CM

## 2014-04-05 ENCOUNTER — Encounter (HOSPITAL_COMMUNITY): Payer: Self-pay | Admitting: General Surgery

## 2014-04-19 ENCOUNTER — Encounter: Payer: Self-pay | Admitting: Gynecology

## 2014-04-19 ENCOUNTER — Ambulatory Visit (INDEPENDENT_AMBULATORY_CARE_PROVIDER_SITE_OTHER): Payer: Medicare PPO | Admitting: Gynecology

## 2014-04-19 ENCOUNTER — Other Ambulatory Visit (HOSPITAL_COMMUNITY)
Admission: RE | Admit: 2014-04-19 | Discharge: 2014-04-19 | Disposition: A | Discharge status: Home / Self Care | Payer: Medicare PPO | Source: Ambulatory Visit | Attending: Gynecology | Admitting: Gynecology

## 2014-04-19 VITALS — BP 116/72 | Ht 61.0 in | Wt 120.0 lb

## 2014-04-19 DIAGNOSIS — Z124 Encounter for screening for malignant neoplasm of cervix: Secondary | ICD-10-CM | POA: Diagnosis present

## 2014-04-19 DIAGNOSIS — C541 Malignant neoplasm of endometrium: Secondary | ICD-10-CM

## 2014-04-19 DIAGNOSIS — M8580 Other specified disorders of bone density and structure, unspecified site: Secondary | ICD-10-CM

## 2014-04-19 DIAGNOSIS — N952 Postmenopausal atrophic vaginitis: Secondary | ICD-10-CM

## 2014-04-19 NOTE — Patient Instructions (Signed)
Office will contact you to help arrange colonoscopy.

## 2014-04-19 NOTE — Addendum Note (Signed)
Addended by: Nelva Nay on: 04/19/2014 03:32 PM   Modules accepted: Orders

## 2014-04-19 NOTE — Progress Notes (Signed)
Janet Cooper [birth year] [id number]        80 y.o.  G1P1 for follow up exam. Several issues noted below.  Past medical history,surgical history, problem list, medications, allergies, family history and social history were all reviewed and documented as reviewed in the EPIC chart.  ROS:  12 system ROS performed with pertinent positives and negatives included in the history, assessment and plan.   Additional significant findings :  none   Exam: Engineer, drilling Vitals:   04/19/14 1456  BP: 116/72  Height: 5\' 1"  (1.549 m)  Weight: 120 lb (54.432 kg)   General appearance:  Normal affect, orientation and appearance. Skin: Grossly normal HEENT: Without gross lesions.  No cervical or supraclavicular adenopathy. Thyroid normal.  Lungs:  Clear without wheezing, rales or rhonchi Cardiac: RR, without RMG Abdominal:  Soft, nontender, without masses, guarding, rebound, organomegaly or hernia Breasts:  Examined lying and sitting without masses, retractions, discharge or axillary adenopathy. Pelvic:  Ext/BUS/vagina with atrophic changes. Vagina shortened. Pap of cuff done  Adnexa  Without masses or tenderness    Anus and perineum  Normal   Rectovaginal  Weak anal sphincter tone without palpated masses or tenderness.    Assessment/Plan:  80 y.o. G1P1 female for follow up exam.  1. History of stage IB grade 1 endometrial cancer status post TAH/BSO 1990.  Vaginal cuff recurrence 1994 with radiation treatment. Vagina foreshortened. Exam NED. Pap of cuff done. Continue with annual cytology. 2. Postmenopausal/atrophic genital changes. Patient without significant symptoms of hot flushes, night sweats, vaginal dryness. Continue to monitor. 3. Rectal incontinence. Actively being evaluated by Dr. Marcello Moores. We'll continue to follow up with her in reference to this. 4. Osteopenia. DEXA 12 2014 T score -2.1 FRAX 8.9%/2.5%. Calcium/vitamin D supplementation reviewed. Repeat DEXA next year a 2 year  interval. 5. Mammography 03/2014. Continue with annual mammography be mildly reviewed. 6. Colonoscopy 9 years ago. Mother died of colon cancer. Recommend following up with gastroenterologist to planned colonoscopy. Had seen Dr. Earlean Shawl in the past but apparently can no longer see him. Will arrange through [place]. Patient knows to call me in 2 weeks if she does not hear from the office to arrange GI evaluation. 7. Health maintenance. No routine blood work done as this is done at her primary physician's office. Follow up 1 year, sooner as needed.     Anastasio Auerbach MD, 3:14 PM 04/19/2014

## 2014-04-20 ENCOUNTER — Telehealth: Payer: Self-pay | Admitting: *Deleted

## 2014-04-20 DIAGNOSIS — Z1211 Encounter for screening for malignant neoplasm of colon: Secondary | ICD-10-CM

## 2014-04-20 DIAGNOSIS — Z800 Family history of malignant neoplasm of digestive organs: Secondary | ICD-10-CM

## 2014-04-20 DIAGNOSIS — Z8542 Personal history of malignant neoplasm of other parts of uterus: Secondary | ICD-10-CM

## 2014-04-20 LAB — URINALYSIS W MICROSCOPIC + REFLEX CULTURE
Bacteria, UA: NONE SEEN
Bilirubin Urine: NEGATIVE
Casts: NONE SEEN
Crystals: NONE SEEN
Glucose, UA: NEGATIVE mg/dL
Hgb urine dipstick: NEGATIVE
Leukocytes, UA: NEGATIVE
Nitrite: NEGATIVE
Protein, ur: NEGATIVE mg/dL
Specific Gravity, Urine: 1.012 (ref 1.005–1.030)
Squamous Epithelial / LPF: NONE SEEN
Urobilinogen, UA: 0.2 mg/dL (ref 0.0–1.0)
pH: 5.5 (ref 5.0–8.0)

## 2014-04-20 NOTE — Telephone Encounter (Signed)
-----   Message from Anastasio Auerbach, MD sent at 04/19/2014  3:18 PM EST ----- Patient needs help arranging GI evaluation at [place]. Reference history of endometrial cancer, status post pelvic radiation, mother with colon cancer history, last colonoscopy reported 2006. Needs evaluation for colonoscopy.

## 2014-04-20 NOTE — Telephone Encounter (Signed)
Referral placed they will contact pt to schedule. 

## 2014-04-22 LAB — CYTOLOGY - PAP

## 2014-04-28 ENCOUNTER — Ambulatory Visit (HOSPITAL_COMMUNITY)
Admission: RE | Admit: 2014-04-28 | Discharge: 2014-04-28 | Disposition: A | Discharge status: Home / Self Care | Payer: Medicare PPO | Source: Ambulatory Visit | Attending: General Surgery | Admitting: General Surgery

## 2014-04-28 ENCOUNTER — Encounter (HOSPITAL_COMMUNITY)
Admission: RE | Disposition: A | Discharge status: Home / Self Care | Payer: Self-pay | Source: Ambulatory Visit | Attending: General Surgery

## 2014-04-28 DIAGNOSIS — R159 Full incontinence of feces: Secondary | ICD-10-CM | POA: Diagnosis present

## 2014-04-28 HISTORY — PX: ANAL RECTAL MANOMETRY: SHX6358

## 2014-04-28 SURGERY — MANOMETRY, ANORECTAL

## 2014-05-04 ENCOUNTER — Encounter (HOSPITAL_COMMUNITY): Payer: Self-pay | Admitting: General Surgery

## 2014-05-04 NOTE — Telephone Encounter (Signed)
Dr.Fontaine Pt said she is her last colonoscopy was not in 2006, she has one in Jan 2012. She asked does she still need a colonoscopy?

## 2014-05-04 NOTE — Telephone Encounter (Signed)
She needs to contact to did the colonoscopy just asked them when they want to repeat it.

## 2014-05-04 NOTE — Telephone Encounter (Signed)
Pt informed with the below. 

## 2014-06-02 ENCOUNTER — Other Ambulatory Visit (INDEPENDENT_AMBULATORY_CARE_PROVIDER_SITE_OTHER): Payer: Self-pay | Admitting: General Surgery

## 2014-06-02 NOTE — H&P (Signed)
Janet Cooper 06/02/2014 1:49 PM Location: [facility] Patient #: [id number] DOB: [birth year] Widowed / Language: Janet Cooper Molt / Race: Asian Female History of Present Illness Janet Ruff MD; 58/02/9832 2:10 PM) Patient words: fecal incontinence.  The patient is a 80 year old female who presents with anal itching. This 80 year old female who is well known to me. She has symptoms of urgency and fecal incontinence which we have been treating medically. She has tried multiple types of fiber and Imodium. She continues to have multiple bowel movements a day with severe urgency and fecal incontinence. She also has a symptom that feels like her rectum is falling out. She has no rectal prolapse that could be seen on physical exam or MRI. She has completed a 2 week bout diary. She is having multiple bowel movements a day, up to 26. Her urgency ratings are mostly fours and fives. She is having 2-3 episodes of leakage a day as well. Other Problems Janet Ruff, MD; 82/50/5397 2:11 PM) Arthritis Back Pain Bladder Problems Cancer High blood pressure Hypercholesterolemia FECAL INCONTINENCE (787.60  R15.9) Pt ongoing fecal frequency and urgency despite medical therapy and physical rehab. Anal manometry shows a weak internal sphincter but normal external sphincter. There was also no RAIR noted.  Past Surgical History Janet Ruff, MD; [birth year]1937 2:11 PM) Cataract Surgery Bilateral. Hysterectomy (due to cancer) - Complete Knee Surgery Left.  Diagnostic Studies History Janet Ruff, MD; 90/24/0973 2:11 PM) Colonoscopy 5-10 years ago Mammogram 1-3 years ago Pap Smear 1-5 years ago  Allergies Marjean Donna, [place]; 06/02/2014 1:50 PM) HYDROcodone Bitartrate *CHEMICALS* Sulfa Antibiotics  Medication History (Sonya Bynum, [name]; 06/02/2014 1:51 PM) Combigan (0.2-0.5% Solution, Ophthalmic daily) Active. Latanoprost (0.005% Solution, Ophthalmic daily) Active. FML  (0.1% Ointment, Ophthalmic as needed) Active. Simvastatin (20MG  Tablet, Oral daily) Active. Azor (5-40MG  Tablet, Oral daily) Active. Aspirin EC (81MG  Tablet DR, Oral) Active. Fish Oil Active.  Social History Janet Ruff, MD; 53/29/9242 2:11 PM) Alcohol use Occasional alcohol use. Caffeine use Carbonated beverages, Coffee, Tea. No drug use Tobacco use Never smoker.  Family History Janet Ruff, MD; 68/34/1962 2:11 PM) Cancer Father. Colon Cancer Mother. Heart Disease Sister. Hypertension Mother.    Vitals (Sonya Bynum [name]; 06/02/2014 1:50 PM) 06/02/2014 1:49 PM Weight: 120 lb Height: 61in Body Surface Area: 1.53 m Body Mass Index: 22.67 kg/m Temp.: 50F(Temporal)  Pulse: 76 (Regular)  BP: 130/74 (Sitting, Left Arm, Standard)     Physical Exam Janet Ruff MD; 22/97/9892 2:12 PM)  General Mental Status-Alert. General Appearance-Consistent with stated age. Hydration-Well hydrated. Voice-Normal.  Head and Neck Head-normocephalic, atraumatic with no lesions or palpable masses. Trachea-midline. Thyroid Gland Characteristics - normal size and consistency.  Eye Eyeball - Bilateral-Extraocular movements intact. Sclera/Conjunctiva - Bilateral-No scleral icterus.  Chest and Lung Exam Chest and lung exam reveals -quiet, even and easy respiratory effort with no use of accessory muscles and on auscultation, normal breath sounds, no adventitious sounds and normal vocal resonance. Inspection Chest Wall - Normal. Back - normal.  Cardiovascular Cardiovascular examination reveals -normal heart sounds, regular rate and rhythm with no murmurs and normal pedal pulses bilaterally.  Abdomen Inspection Inspection of the abdomen reveals - No Hernias. Palpation/Percussion Palpation and Percussion of the abdomen reveal - Soft, Non Tender, No Rebound tenderness, No Rigidity (guarding) and No  hepatosplenomegaly. Auscultation Auscultation of the abdomen reveals - Bowel sounds normal.  Neurologic Neurologic evaluation reveals -alert and oriented x 3 with no impairment of recent or remote memory. Mental Status-Normal.  Musculoskeletal Global Assessment -Note:no gross  deformities.  Normal Exam - Left-Upper Extremity Strength Normal and Lower Extremity Strength Normal. Normal Exam - Right-Upper Extremity Strength Normal and Lower Extremity Strength Normal.    Assessment & Plan Janet Ruff MD; 70/96/2836 2:13 PM)  FECAL INCONTINENCE (787.60  R15.9) Story: Pt ongoing fecal frequency and urgency despite medical therapy and physical rehab. Anal manometry shows a weak internal sphincter but normal external sphincter. There was also no RAIR noted. She has completed a bowel diarrhea which shows multiple episodes of leakage and severe urgency. Impression: I have recommended that she undergo at least a test of sacral nerve stimulation. I think it may help with her symptoms. We discussed that the goal would be to reduce her symptoms by at least half. She is willing to give this a try. Risks include bleeding, infection and recurrence of symptoms.

## 2014-06-05 ENCOUNTER — Emergency Department (HOSPITAL_COMMUNITY)
Admission: EM | Admit: 2014-06-05 | Discharge: 2014-06-05 | Disposition: A | Discharge status: Home / Self Care | Payer: Medicare PPO | Attending: Emergency Medicine | Admitting: Emergency Medicine

## 2014-06-05 ENCOUNTER — Emergency Department (HOSPITAL_COMMUNITY): Payer: Medicare PPO

## 2014-06-05 ENCOUNTER — Encounter (HOSPITAL_COMMUNITY): Payer: Self-pay | Admitting: Emergency Medicine

## 2014-06-05 DIAGNOSIS — Y998 Other external cause status: Secondary | ICD-10-CM | POA: Diagnosis not present

## 2014-06-05 DIAGNOSIS — I10 Essential (primary) hypertension: Secondary | ICD-10-CM | POA: Insufficient documentation

## 2014-06-05 DIAGNOSIS — Z79899 Other long term (current) drug therapy: Secondary | ICD-10-CM | POA: Diagnosis not present

## 2014-06-05 DIAGNOSIS — S0990XA Unspecified injury of head, initial encounter: Secondary | ICD-10-CM | POA: Diagnosis not present

## 2014-06-05 DIAGNOSIS — W19XXXA Unspecified fall, initial encounter: Secondary | ICD-10-CM

## 2014-06-05 DIAGNOSIS — S52562A Barton's fracture of left radius, initial encounter for closed fracture: Secondary | ICD-10-CM | POA: Diagnosis not present

## 2014-06-05 DIAGNOSIS — Y9389 Activity, other specified: Secondary | ICD-10-CM | POA: Diagnosis not present

## 2014-06-05 DIAGNOSIS — S6992XA Unspecified injury of left wrist, hand and finger(s), initial encounter: Secondary | ICD-10-CM | POA: Diagnosis present

## 2014-06-05 DIAGNOSIS — Z7982 Long term (current) use of aspirin: Secondary | ICD-10-CM | POA: Insufficient documentation

## 2014-06-05 DIAGNOSIS — W01198A Fall on same level from slipping, tripping and stumbling with subsequent striking against other object, initial encounter: Secondary | ICD-10-CM | POA: Diagnosis not present

## 2014-06-05 DIAGNOSIS — Y9289 Other specified places as the place of occurrence of the external cause: Secondary | ICD-10-CM | POA: Diagnosis not present

## 2014-06-05 DIAGNOSIS — Z8544 Personal history of malignant neoplasm of other female genital organs: Secondary | ICD-10-CM | POA: Insufficient documentation

## 2014-06-05 DIAGNOSIS — S8992XA Unspecified injury of left lower leg, initial encounter: Secondary | ICD-10-CM | POA: Diagnosis not present

## 2014-06-05 DIAGNOSIS — Z8542 Personal history of malignant neoplasm of other parts of uterus: Secondary | ICD-10-CM | POA: Diagnosis not present

## 2014-06-05 DIAGNOSIS — S62102A Fracture of unspecified carpal bone, left wrist, initial encounter for closed fracture: Secondary | ICD-10-CM

## 2014-06-05 MED ORDER — BUPIVACAINE HCL 0.5 % IJ SOLN: 5.0000 mL | Freq: Once | INTRAMUSCULAR | Status: DC

## 2014-06-05 MED ORDER — MORPHINE SULFATE 4 MG/ML IJ SOLN
4.0000 mg | Freq: Once | INTRAMUSCULAR | Status: AC
  Administered 2014-06-05: 4 mg via INTRAVENOUS
  Filled 2014-06-05: qty 1

## 2014-06-05 MED ORDER — BUPIVACAINE HCL (PF) 0.5 % IJ SOLN
10.0000 mL | Freq: Once | INTRAMUSCULAR | Status: AC
  Administered 2014-06-05: 10 mL
  Filled 2014-06-05: qty 30

## 2014-06-05 MED ORDER — IBUPROFEN 600 MG PO TABS: 600.0000 mg | ORAL_TABLET | Freq: Four times a day (QID) | ORAL | Status: DC | PRN

## 2014-06-05 MED ORDER — TETANUS-DIPHTH-ACELL PERTUSSIS 5-2.5-18.5 LF-MCG/0.5 IM SUSP
0.5000 mL | Freq: Once | INTRAMUSCULAR | Status: AC
  Administered 2014-06-05: 0.5 mL via INTRAMUSCULAR
  Filled 2014-06-05: qty 0.5

## 2014-06-05 MED ORDER — IBUPROFEN 200 MG PO TABS
400.0000 mg | ORAL_TABLET | Freq: Once | ORAL | Status: AC
  Administered 2014-06-05: 400 mg via ORAL
  Filled 2014-06-05: qty 2

## 2014-06-05 MED ORDER — OXYCODONE-ACETAMINOPHEN 5-325 MG PO TABS: 1.0000 | ORAL_TABLET | ORAL | Status: DC | PRN

## 2014-06-05 MED ORDER — LIDOCAINE-EPINEPHRINE 2 %-1:100000 IJ SOLN
20.0000 mL | Freq: Once | INTRAMUSCULAR | Status: AC
  Administered 2014-06-05: 20 mL via INTRADERMAL
  Filled 2014-06-05: qty 1

## 2014-06-05 MED ORDER — ONDANSETRON 4 MG PO TBDP: 4.0000 mg | ORAL_TABLET | ORAL | Status: DC | PRN

## 2014-06-05 MED ORDER — ONDANSETRON HCL 4 MG/2ML IJ SOLN
4.0000 mg | Freq: Once | INTRAMUSCULAR | Status: AC
  Administered 2014-06-05: 4 mg via INTRAVENOUS
  Filled 2014-06-05: qty 2

## 2014-06-05 NOTE — Discharge Instructions (Signed)
You have a broken left wrist.  Please follow up closely with Dr. Lenon Curt on Monday for further management of your condition.  Keep wrist elevated to decrease swelling.  Cast or Splint Care Casts and splints support injured limbs and keep bones from moving while they heal.  HOME CARE  Keep the cast or splint uncovered during the drying period.  A plaster cast can take 24 to 48 hours to dry.  A fiberglass cast will dry in less than 1 hour.  Do not rest the cast on anything harder than a pillow for 24 hours.  Do not put weight on your injured limb. Do not put pressure on the cast. Wait for your doctor's approval.  Keep the cast or splint dry.  Cover the cast or splint with a plastic bag during baths or wet weather.  If you have a cast over your chest and belly (trunk), take sponge baths until the cast is taken off.  If your cast gets wet, dry it with a towel or blow dryer. Use the cool setting on the blow dryer.  Keep your cast or splint clean. Wash a dirty cast with a damp cloth.  Do not put any objects under your cast or splint.  Do not scratch the skin under the cast with an object. If itching is a problem, use a blow dryer on a cool setting over the itchy area.  Do not trim or cut your cast.  Do not take out the padding from inside your cast.  Exercise your joints near the cast as told by your doctor.  Raise (elevate) your injured limb on 1 or 2 pillows for the first 1 to 3 days. GET HELP IF:  Your cast or splint cracks.  Your cast or splint is too tight or too loose.  You itch badly under the cast.  Your cast gets wet or has a soft spot.  You have a bad smell coming from the cast.  You get an object stuck under the cast.  Your skin around the cast becomes red or sore.  You have new or more pain after the cast is put on. GET HELP RIGHT AWAY IF:  You have fluid leaking through the cast.  You cannot move your fingers or toes.  Your fingers or toes turn blue or  white or are cool, painful, or puffy (swollen).  You have tingling or lose feeling (numbness) around the injured area.  You have bad pain or pressure under the cast.  You have trouble breathing or have shortness of breath.  You have chest pain. Document Released: 09/20/2010 Document Revised: 01/21/2013 Document Reviewed: 11/27/2012 [facility] Patient Information 2015 [place], [place]. This information is not intended to replace advice given to you by your health care provider. Make sure you discuss any questions you have with your health care provider. Wrist Fracture A wrist fracture is a break or crack in one of the bones of your wrist. Your wrist is made up of eight small bones at the palm of your hand (carpal bones) and two long bones that make up your forearm (radius and ulna).  CAUSES   A direct blow to the wrist.  Falling on an outstretched hand.  Trauma, such as a car accident or a fall. RISK FACTORS Risk factors for wrist fracture include:   Participating in contact and high-risk sports, such as skiing, biking, and ice skating.  Taking steroid medicines.  Smoking.  Being female.  Being Caucasian.  Drinking more than  three alcoholic beverages per day.  Having low or lowered bone density (osteoporosis or osteopenia).  Age. Older adults have decreased bone density.  Women who have had menopause.  History of previous fractures. SIGNS AND SYMPTOMS Symptoms of wrist fractures include tenderness, bruising, and inflammation. Additionally, the wrist may hang in an odd position or appear deformed.  DIAGNOSIS Diagnosis may include:  Physical exam.  X-ray. TREATMENT Treatment depends on many factors, including the nature and location of the fracture, your age, and your activity level. Treatment for wrist fracture can be nonsurgical or surgical.  Nonsurgical Treatment A plaster cast or splint may be applied to your wrist if the bone is in a good position. If the fracture  is not in good position, it may be necessary for your health care provider to realign it before applying a splint or cast. Usually, a cast or splint will be worn for several weeks.  Surgical Treatment Sometimes the position of the bone is so far out of place that surgery is required to apply a device to hold it together as it heals. Depending on the fracture, there are a number of options for holding the bone in place while it heals, such as a cast and metal pins.  HOME CARE INSTRUCTIONS  Keep your injured wrist elevated and move your fingers as much as possible.  Do not put pressure on any part of your cast or splint. It may break.   Use a plastic bag to protect your cast or splint from water while bathing or showering. Do not lower your cast or splint into water.  Take medicines only as directed by your health care provider.  Keep your cast or splint clean and dry. If it becomes wet, damaged, or suddenly feels too tight, contact your health care provider right away.  Do not use any tobacco products including cigarettes, chewing tobacco, or electronic cigarettes. Tobacco can delay bone healing. If you need help quitting, ask your health care provider.  Keep all follow-up visits as directed by your health care provider. This is important.  Ask your health care provider if you should take supplements of calcium and vitamins C and D to promote bone healing. SEEK MEDICAL CARE IF:   Your cast or splint is damaged, breaks, or gets wet.  You have a fever.  You have chills.  You have continued severe pain or more swelling than you did before the cast was put on. SEEK IMMEDIATE MEDICAL CARE IF:   Your hand or fingernails on the injured arm turn blue or gray, or feel cold or numb.  You have decreased feeling in the fingers of your injured arm. MAKE SURE YOU:  Understand these instructions.  Will watch your condition.  Will get help right away if you are not doing well or get  worse. Document Released: 02/28/2005 Document Revised: 10/05/2013 Document Reviewed: 06/08/2011 [facility] Patient Information 2015 [place], [place]. This information is not intended to replace advice given to you by your health care provider. Make sure you discuss any questions you have with your health care provider.  Wrist Fracture A wrist fracture is a break or crack in one of the bones of your wrist. Your wrist is made up of eight small bones at the palm of your hand (carpal bones) and two long bones that make up your forearm (radius and ulna).  CAUSES   A direct blow to the wrist.  Falling on an outstretched hand.  Trauma, such as a car accident or  a fall. RISK FACTORS Risk factors for wrist fracture include:   Participating in contact and high-risk sports, such as skiing, biking, and ice skating.  Taking steroid medicines.  Smoking.  Being female.  Being Caucasian.  Drinking more than three alcoholic beverages per day.  Having low or lowered bone density (osteoporosis or osteopenia).  Age. Older adults have decreased bone density.  Women who have had menopause.  History of previous fractures. SIGNS AND SYMPTOMS Symptoms of wrist fractures include tenderness, bruising, and inflammation. Additionally, the wrist may hang in an odd position or appear deformed.  DIAGNOSIS Diagnosis may include:  Physical exam.  X-ray. TREATMENT Treatment depends on many factors, including the nature and location of the fracture, your age, and your activity level. Treatment for wrist fracture can be nonsurgical or surgical.  Nonsurgical Treatment A plaster cast or splint may be applied to your wrist if the bone is in a good position. If the fracture is not in good position, it may be necessary for your health care provider to realign it before applying a splint or cast. Usually, a cast or splint will be worn for several weeks.  Surgical Treatment Sometimes the position of the bone is so  far out of place that surgery is required to apply a device to hold it together as it heals. Depending on the fracture, there are a number of options for holding the bone in place while it heals, such as a cast and metal pins.  HOME CARE INSTRUCTIONS  Keep your injured wrist elevated and move your fingers as much as possible.  Do not put pressure on any part of your cast or splint. It may break.   Use a plastic bag to protect your cast or splint from water while bathing or showering. Do not lower your cast or splint into water.  Take medicines only as directed by your health care provider.  Keep your cast or splint clean and dry. If it becomes wet, damaged, or suddenly feels too tight, contact your health care provider right away.  Do not use any tobacco products including cigarettes, chewing tobacco, or electronic cigarettes. Tobacco can delay bone healing. If you need help quitting, ask your health care provider.  Keep all follow-up visits as directed by your health care provider. This is important.  Ask your health care provider if you should take supplements of calcium and vitamins C and D to promote bone healing. SEEK MEDICAL CARE IF:   Your cast or splint is damaged, breaks, or gets wet.  You have a fever.  You have chills.  You have continued severe pain or more swelling than you did before the cast was put on. SEEK IMMEDIATE MEDICAL CARE IF:   Your hand or fingernails on the injured arm turn blue or gray, or feel cold or numb.  You have decreased feeling in the fingers of your injured arm. MAKE SURE YOU:  Understand these instructions.  Will watch your condition.  Will get help right away if you are not doing well or get worse. Document Released: 02/28/2005 Document Revised: 10/05/2013 Document Reviewed: 06/08/2011 [facility] Patient Information 2015 [place], [place]. This information is not intended to replace advice given to you by your health care provider. Make sure  you discuss any questions you have with your health care provider.

## 2014-06-05 NOTE — ED Notes (Signed)
PA and ortho tech at the bedside

## 2014-06-05 NOTE — ED Notes (Signed)
Ortho called 

## 2014-06-05 NOTE — ED Notes (Signed)
Patient fell today-has gross left wrist injury and has left knee swelling (previous knee surgery). Also hit head on right upper anterior forehead. Denies dizziness, SOB. Not on blood thinners. Ambulatory with steady gait.  A&Ox4. Has not taken any pain medication today.

## 2014-06-05 NOTE — ED Provider Notes (Signed)
CSN: [id number]     Arrival date & time 06/05/14  1440 History   First MD Initiated Contact with Patient 06/05/14 1631     Chief Complaint  Patient presents with  . Fall  . Wrist Injury  . Knee Injury  . Head Injury     (Consider location/radiation/quality/duration/timing/severity/associated sxs/prior Treatment) HPI   81 year old female with history of osteopenia, endometrial cancer status post radiation treatment presents for evaluation of a recent fall. Patient states approximately an hour ago she was walking on the sidewalk, tripped and fell forward striking her forehead against the ground and her left knee. She extended her left hand to break the fall. She denies any precipitating symptoms prior to the fall and denies any loss of consciousness. She is currently complaining of sharp intense pain to her left wrist and left knee. Patient states she has an old fracture in the left knee in which she wears a knee sleeves on a regular basis. She does complaining of pain to the left knee as well and some forehead tenderness. She is not on any blood thinner medication. She denies having any significant neck pain or any other injury. Denies any numbness or weakness. She does not have an orthopedic doctor.  Past Medical History  Diagnosis Date  . Hypertension   . Elevated cholesterol   . Osteopenia 05/2013    T score -2.1 FRAX 8.9%/2.5%  . Endometrial cancer 1990    Stage IB grade 1 TAH BSO  . Vaginal cancer 1994    Vaginal cuff recurrence of endometrial cancer status post radiation treatment   Past Surgical History  Procedure Laterality Date  . Patella fracture surgery    . Cataract extraction    . Abdominal hysterectomy    . Anal rectal manometry N/A 03/29/2014    Procedure: ANO RECTAL MANOMETRY;  Surgeon: [name], MD;  Location: [facility];  Service: Endoscopy;  Laterality: N/A;  . Anal rectal manometry N/A 04/28/2014    Procedure: ANO RECTAL MANOMETRY;  Surgeon: [name],  MD;  Location: [facility];  Service: Endoscopy;  Laterality: N/A;   Family History  Problem Relation Age of Onset  . Colon cancer Mother   . Cancer Father     Liver cancer  . Heart disease Maternal Grandmother   . Heart disease Maternal Grandfather    History  Substance Use Topics  . Smoking status: Never Smoker   . Smokeless tobacco: Not on file  . Alcohol Use: 0.5 oz/week    1 drink(s) per week     Comment: Occas   OB History    Gravida Para Term Preterm AB TAB SAB Ectopic Multiple Living   1 1        1      Review of Systems  Constitutional: Negative for fever.  Respiratory: Negative for shortness of breath.   Cardiovascular: Negative for chest pain.  Musculoskeletal: Positive for joint swelling. Negative for neck pain.  Skin: Positive for wound.  Neurological: Positive for headaches. Negative for syncope and numbness.  All other systems reviewed and are negative.     Allergies  Hydrocodone and Sulfa antibiotics  Home Medications   Prior to Admission medications   Medication Sig Start Date End Date Taking? Authorizing Provider  acetaminophen (TYLENOL) 500 MG tablet Take 500-1,000 mg by mouth every 6 (six) hours as needed.   Yes Historical Provider, MD  amLODipine-olmesartan (AZOR) 5-40 MG per tablet Take 1 tablet by mouth daily.   Yes Historical Provider, MD  aspirin 81 MG tablet Take 81 mg by mouth daily.   Yes Historical Provider, MD  calcium-vitamin D (OSCAL WITH D) 500-200 MG-UNIT per tablet Take 1 tablet by mouth 2 (two) times daily.   Yes Historical Provider, MD  cephALEXin (KEFLEX) 500 MG capsule Take 500 mg by mouth at bedtime.    Yes Historical Provider, MD  cholecalciferol (VITAMIN D) 1000 UNITS tablet Take 2,000 Units by mouth daily.   Yes Historical Provider, MD  dorzolamide-timolol (COSOPT) 22.3-6.8 MG/ML ophthalmic solution Place 1 drop into both eyes 2 (two) times daily.   Yes Historical Provider, MD  glucosamine-chondroitin 500-400 MG tablet Take 1  tablet by mouth 2 (two) times daily.   Yes Historical Provider, MD  latanoprost (XALATAN) 0.005 % ophthalmic solution Place 1 drop into both eyes at bedtime.    Yes Historical Provider, MD  mirabegron ER (MYRBETRIQ) 50 MG TB24 tablet Take 50 mg by mouth daily.   Yes Historical Provider, MD  Omega-3 Fatty Acids (FISH OIL PO) Take 1 g by mouth 2 (two) times daily.    Yes Historical Provider, MD  simvastatin (ZOCOR) 20 MG tablet Take 20 mg by mouth every evening.   Yes Historical Provider, MD   BP 143/88 mmHg  Pulse 80  Temp(Src) 98.4 F (36.9 C) (Oral)  Resp 18  SpO2 100% Physical Exam  Constitutional: She is oriented to person, place, and time. She appears well-developed and well-nourished. No distress.  HENT:  Head: Atraumatic.  Hematoma noted to right forehead. No hemotympanum, no septal hematoma, no malocclusion, no midface tenderness.  Eyes: Conjunctivae and EOM are normal. Pupils are equal, round, and reactive to light.  Neck: Normal range of motion. Neck supple.  No cervical midline spine tenderness crepitus or step-off.  Cardiovascular: Normal rate and regular rhythm.   Pulmonary/Chest: Effort normal and breath sounds normal.  Abdominal: Soft. There is no tenderness.  Musculoskeletal: She exhibits tenderness (Left wrist: Obvious deformity noted to wrist with significant edema, radial pulse 2+, sensation is intact. Left hand with bruising noted along ring finger and a small skin tear to the dorsum of the PIP. Full range of motion without deformity.).  Left knee: Tenderness noted to anterior knee with normal flexion and extension, no deformity noted.  Neurological: She is alert and oriented to person, place, and time. GCS eye subscore is 4. GCS verbal subscore is 5. GCS motor subscore is 6.  Skin: No rash noted.  Psychiatric: She has a normal mood and affect.  Nursing note and vitals reviewed.   ED Course  Reduction of fracture Date/Time: 06/05/2014 6:24 PM Performed by: Domenic Moras Authorized by: Domenic Moras Consent: Verbal consent obtained. Risks and benefits: risks, benefits and alternatives were discussed Consent given by: patient Patient understanding: patient states understanding of the procedure being performed Patient consent: the patient's understanding of the procedure matches consent given Procedure consent: procedure consent matches procedure scheduled Relevant documents: relevant documents present and verified Imaging studies: imaging studies available Patient identity confirmed: verbally with patient and arm band Time out: Immediately prior to procedure a "time out" was called to verify the correct patient, procedure, equipment, support staff and site/side marked as required. Local anesthesia used: yes Anesthesia: hematoma block Local anesthetic: lidocaine 1% with epinephrine and bupivacaine 0.5% without epinephrine (38ml of lidocain and 71ml of marcain mixed) Anesthetic total: 10 ml Patient sedated: no Patient tolerance: Patient tolerated the procedure well with no immediate complications Comments: L wrist closed fracture reduction. Hematoma block of lidocaine/marcain mixed  with a total of 40ml.  I provided traction to L forearm and attempt to reduce the distal radial fx by applying direct force to L wrist with both hands.  I radial gutter splint was then applied by ortho tech.  Pt is NVI post reduction.     (including critical care time)  Patient here for evaluation of a mechanical fall. She has an obvious deformity to her left wrist. X-ray demonstrate a transverse distal radial metaphysis heel acute fractures with apex anterior angulation sent intra-articular extension into the distal radial articular surface. It was described as a dorsal Barton Fracture.  She is neurovascularly intact. X-ray of her left knee demonstrate degenerative change however no evidence of acute fracture. Given her age and the mechanism of her injury, plan to obtain CT scan of  head and neck. Patient also has injury to the left ring finger which I will obtain x-ray as well. Pain medication given, can discuss with Dr. Johnney Killian.   5:37 PM I have consulted with hand Specialist Dr. Lenon Curt who request reduction and splinting.  Pt to f/u in office on Monday for further care.  Plan to attempt hematoma block and reduction.    6:53 PM Post reduction xray showing improvement.  Pt will be discharged with close f/u with Dr. Lenon Curt on Monday for further care.      Labs Review Labs Reviewed - No data to display  Imaging Review Dg Forearm Left  06/05/2014   CLINICAL DATA:  Left wrist pain secondary to a fall outside today.  EXAM: LEFT FOREARM - 2 VIEW  COMPARISON:  None.  FINDINGS: There is a comminuted dorsally impacted fracture of the distal radius. The fracture involves the articular surface. There is an old deformity of the distal ulna with arthritic changes at the distal radioulnar joint. There are arthritic changes at the radiocarpal joint.  IMPRESSION: Impacted comminuted dorsally displaced fracture of the distal left radius. Old deformity of the distal ulna.   Electronically Signed   By: Rozetta Nunnery M.D.   On: 06/05/2014 15:20   Dg Wrist Complete Left  06/05/2014   CLINICAL DATA:  Status post reduction of left wrist fracture. Subsequent encounter.  EXAM: LEFT WRIST - COMPLETE 3+ VIEW  COMPARISON:  Left wrist radiographs performed earlier today at 3:00 p.m.  FINDINGS: There is improved alignment of the patient's comminuted fracture of the distal radius; previously noted dorsal tilt has resolved. The impacted appearance of the distal radius is grossly unchanged, with fracture lines extending to the radiocarpal joint. The distal ulna appears grossly intact. Positive ulnar variance is again noted.  The carpal rows are grossly unremarkable in appearance. The soft tissues are difficult to fully assess due to the overlying splint.  IMPRESSION: Improved alignment of comminuted fracture of  the distal radius. Previously noted dorsal tilt has resolved. Impacted appearance of the distal radius is grossly unchanged, with fracture lines extending to the radiocarpal joint. Positive ulnar variance again noted, given the degree of distal radial impaction.   Electronically Signed   By: Garald Balding M.D.   On: 06/05/2014 18:40   Dg Wrist Complete Left  06/05/2014   CLINICAL DATA:  Fall. Left wrist pain. Remote wrist fracture. The patient fell while walking outside.  EXAM: LEFT WRIST - COMPLETE 3+ VIEW  COMPARISON:  None.  FINDINGS: Transverse distal radial metaphysis heel acute fracture with apex anterior angulation and intra-articular extension into the distal radial articular surface and likely into the distal radioulnar joint. The carpus  translates dorsally at with the dorsal fragment. Resulting positive ulnar variance.  Geode or subcortical cyst in the scaphoid.  IMPRESSION: 1. Dorsal Barton fracture.   Electronically Signed   By: Sherryl Barters M.D.   On: 06/05/2014 15:21   Ct Head Wo Contrast  06/05/2014   CLINICAL DATA:  Status post fall. Hit head on right anterior upper forehead. Generalized neck pain. Initial encounter.  EXAM: CT HEAD WITHOUT CONTRAST  CT CERVICAL SPINE WITHOUT CONTRAST  TECHNIQUE: Multidetector CT imaging of the head and cervical spine was performed following the standard protocol without intravenous contrast. Multiplanar CT image reconstructions of the cervical spine were also generated.  COMPARISON:  None.  FINDINGS: CT HEAD FINDINGS  There is no evidence of acute infarction, mass lesion, or intra- or extra-axial hemorrhage on CT.  Scattered periventricular and subcortical white matter change likely reflects small vessel ischemic microangiopathy.  The posterior fossa, including the cerebellum, brainstem and fourth ventricle, is within normal limits. The third and lateral ventricles, and basal ganglia are unremarkable in appearance. The cerebral hemispheres are symmetric in  appearance, with normal gray-white differentiation. No mass effect or midline shift is seen.  There is no evidence of fracture; visualized osseous structures are unremarkable in appearance. The visualized portions of the orbits are within normal limits. The paranasal sinuses and mastoid air cells are well-aerated. Mild soft tissue swelling is noted overlying the right frontal calvarium.  CT CERVICAL SPINE FINDINGS  There is no evidence of fracture or subluxation. Vertebral bodies demonstrate normal height and alignment. A few small anterior and posterior disc osteophyte complexes are seen along the cervical spine, and mild degenerative change is noted about the dens. Underlying mild facet disease is noted. Intervertebral disc spaces are preserved. Prevertebral soft tissues are within normal limits.  Minimal calcification and vaguely decreased attenuation are noted within the thyroid gland, without a dominant mass. The visualized lung apices are clear. No significant soft tissue abnormalities are seen.  IMPRESSION: 1. No evidence of traumatic intracranial injury or fracture. 2. No evidence of fracture or subluxation along the cervical spine. 3. Mild soft tissue swelling overlying the right frontal calvarium. 4. Scattered small vessel ischemic microangiopathy. 5. Minimal degenerative change along the cervical spine.   Electronically Signed   By: Garald Balding M.D.   On: 06/05/2014 17:44   Ct Cervical Spine Wo Contrast  06/05/2014   CLINICAL DATA:  Status post fall. Hit head on right anterior upper forehead. Generalized neck pain. Initial encounter.  EXAM: CT HEAD WITHOUT CONTRAST  CT CERVICAL SPINE WITHOUT CONTRAST  TECHNIQUE: Multidetector CT imaging of the head and cervical spine was performed following the standard protocol without intravenous contrast. Multiplanar CT image reconstructions of the cervical spine were also generated.  COMPARISON:  None.  FINDINGS: CT HEAD FINDINGS  There is no evidence of acute  infarction, mass lesion, or intra- or extra-axial hemorrhage on CT.  Scattered periventricular and subcortical white matter change likely reflects small vessel ischemic microangiopathy.  The posterior fossa, including the cerebellum, brainstem and fourth ventricle, is within normal limits. The third and lateral ventricles, and basal ganglia are unremarkable in appearance. The cerebral hemispheres are symmetric in appearance, with normal gray-white differentiation. No mass effect or midline shift is seen.  There is no evidence of fracture; visualized osseous structures are unremarkable in appearance. The visualized portions of the orbits are within normal limits. The paranasal sinuses and mastoid air cells are well-aerated. Mild soft tissue swelling is noted overlying the right frontal  calvarium.  CT CERVICAL SPINE FINDINGS  There is no evidence of fracture or subluxation. Vertebral bodies demonstrate normal height and alignment. A few small anterior and posterior disc osteophyte complexes are seen along the cervical spine, and mild degenerative change is noted about the dens. Underlying mild facet disease is noted. Intervertebral disc spaces are preserved. Prevertebral soft tissues are within normal limits.  Minimal calcification and vaguely decreased attenuation are noted within the thyroid gland, without a dominant mass. The visualized lung apices are clear. No significant soft tissue abnormalities are seen.  IMPRESSION: 1. No evidence of traumatic intracranial injury or fracture. 2. No evidence of fracture or subluxation along the cervical spine. 3. Mild soft tissue swelling overlying the right frontal calvarium. 4. Scattered small vessel ischemic microangiopathy. 5. Minimal degenerative change along the cervical spine.   Electronically Signed   By: Garald Balding M.D.   On: 06/05/2014 17:44   Dg Knee Complete 4 Views Left  06/05/2014   CLINICAL DATA:  Fall.  Pain.  EXAM: LEFT KNEE - COMPLETE 4+ VIEW   COMPARISON:  02/25/2009  FINDINGS: Mild joint space narrowing and subchondral cyst formation involving the medial compartment of the knee. Remote trauma involving the inferior pole of the patella. Mild to moderate patellofemoral osteoarthritis. Mild osteopenia. No joint effusion. No acute fracture or dislocation.  IMPRESSION: Degenerative and remote posttraumatic changes. No acute osseous abnormality.   Electronically Signed   By: Abigail Miyamoto M.D.   On: 06/05/2014 15:22   Dg Hand Complete Left  06/05/2014   CLINICAL DATA:  Pain and swelling and bruising of the hand secondary to a fall while walking outside today.  EXAM: LEFT HAND - COMPLETE 3+ VIEW  COMPARISON:  None.  FINDINGS: There is a comminuted impacted fracture of the distal radius. No other acute osseous abnormality. Old deformity of the distal ulna. Cystic degenerative changes in the lunate and scaphoid. Diffuse osteopenia. Slight arthritis at the first carpal metacarpal joint. Slight osteoarthritis of the IP joints of the fingers.  IMPRESSION: Comminuted fracture of the distal radius. No other acute abnormality.   Electronically Signed   By: Rozetta Nunnery M.D.   On: 06/05/2014 17:50     EKG Interpretation None      MDM   Final diagnoses:  Fall  Wrist fracture, closed, left, initial encounter    BP 143/88 mmHg  Pulse 80  Temp(Src) 98.4 F (36.9 C) (Oral)  Resp 18  SpO2 100%  I have reviewed nursing notes and vital signs. I personally reviewed the imaging tests through PACS system  I reviewed available ER/hospitalization records thought the EMR     Domenic Moras, PA-C 06/05/14 1853  Charlesetta Shanks, MD 06/06/14 1655

## 2014-06-05 NOTE — ED Notes (Signed)
Patient transported to X-ray 

## 2014-08-23 ENCOUNTER — Other Ambulatory Visit: Payer: Self-pay | Admitting: General Surgery

## 2014-08-23 NOTE — H&P (Signed)
Janet Cooper 08/23/2014 11:54 AM Location: [facility] Patient #: [id number] DOB: [birth year] Widowed / Language: [name] / Race: Asian Female History of Present Illness [name]; 7/84/6962 12:19 PM) Patient words: fecal incontinence.  The patient is a 81 year old female who presents with anal itching. This 81 year old female who is well known to me. She has symptoms of urgency and fecal incontinence which we have been treating medically. She has tried multiple types of fiber and Imodium. She continues to have multiple bowel movements a day with severe urgency and fecal incontinence. She also has a symptom that feels like her rectum is falling out. She has no rectal prolapse that could be seen on physical exam or MRI. She has completed a 2 week bowel diary. She is having multiple bowel movements a day, up to 26. Her urgency ratings are mostly fours and fives. She is having 2-3 episodes of leakage a day as well. Other Problems [name], MD; 9/52/8413 12:19 PM) Arthritis Back Pain Bladder Problems Cancer High blood pressure Hypercholesterolemia FECAL INCONTINENCE (787.60  R15.9)  Past Surgical History [name], MD; 2/44/0102 12:19 PM) Cataract Surgery Bilateral. Hysterectomy (due to cancer) - Complete Knee Surgery Left.  Diagnostic Studies History [name], MD; 12/27/3662 12:19 PM) Colonoscopy 5-10 years ago Mammogram 1-3 years ago Pap Smear 1-5 years ago  Allergies [name], [name]; 08/23/2014 11:56 AM) HYDROcodone Bitartrate *CHEMICALS* Sulfa Antibiotics  Medication History [name], [name]; 08/23/2014 11:56 AM) Aspirin EC (81MG  Tablet DR, Oral) Active. Fish Oil Active. Combigan (0.2-0.5% Solution, Ophthalmic daily) Active. Latanoprost (0.005% Solution, Ophthalmic daily) Active. FML (0.1% Ointment, Ophthalmic as needed) Active. Simvastatin (20MG  Tablet, Oral daily) Active. Azor (5-40MG  Tablet, Oral  daily) Active. Medications Reconciled  Social History [name], MD; 09/04/4740 12:19 PM) Alcohol use Occasional alcohol use. Caffeine use Carbonated beverages, Coffee, Tea. No drug use Tobacco use Never smoker.  Family History [name], MD; 5/95/6387 12:20 PM) Cancer Father. Colon Cancer Mother. Heart Disease Sister. Hypertension Mother.    Vitals [name] [name]; 08/23/2014 11:57 AM) 08/23/2014 11:56 AM Weight: 119 lb Height: 61in Body Surface Area: 1.52 m Body Mass Index: 22.48 kg/m Temp.: 44F(Temporal)  Pulse: 68 (Regular)  Resp.: 16 (Unlabored)  BP: 120/68 (Sitting, Left Arm, Standard)     Physical Exam [name]; 5/64/3329 12:20 PM)  General Mental Status-Alert. General Appearance-Consistent with stated age. Hydration-Well hydrated. Voice-Normal.  Head and Neck Head-normocephalic, atraumatic with no lesions or palpable masses. Trachea-midline. Thyroid Gland Characteristics - normal size and consistency.  Eye Eyeball - Bilateral-Extraocular movements intact. Sclera/Conjunctiva - Bilateral-No scleral icterus.  Chest and Lung Exam Chest and lung exam reveals -quiet, even and easy respiratory effort with no use of accessory muscles and on auscultation, normal breath sounds, no adventitious sounds and normal vocal resonance. Inspection Chest Wall - Normal. Back - normal.  Cardiovascular Cardiovascular examination reveals -normal heart sounds, regular rate and rhythm with no murmurs and normal pedal pulses bilaterally.  Abdomen Inspection Inspection of the abdomen reveals - No Hernias. Palpation/Percussion Palpation and Percussion of the abdomen reveal - Soft, Non Tender, No Rebound tenderness, No Rigidity (guarding) and No hepatosplenomegaly. Auscultation Auscultation of the abdomen reveals - Bowel sounds normal.  Neurologic Neurologic evaluation reveals -alert and oriented x 3 with no  impairment of recent or remote memory. Mental Status-Normal.  Musculoskeletal Global Assessment -Note:no gross deformities.  Normal Exam - Left-Upper Extremity Strength Normal and Lower Extremity Strength Normal. Normal Exam - Right-Upper Extremity Strength Normal and Lower  Extremity Strength Normal.    Assessment & Plan [name]; 4/81/8563 12:17 PM)  FECAL INCONTINENCE (787.60  R15.9) Impression: Pt with ongoing fecal frequency and urgency despite medical therapy and physical rehab (PTENS). Anal manometry shows a weak internal sphincter but normal external sphincter. There was also no RAIR noted. She has completed a bowel diarrhea which shows multiple episodes of leakage and severe urgency. We have discussed the risk and benefits of sacral nerve stimulator. She has agreed to proceed with surgery. Risks include failure of the device, infection and pain.

## 2014-08-26 ENCOUNTER — Encounter (HOSPITAL_BASED_OUTPATIENT_CLINIC_OR_DEPARTMENT_OTHER): Payer: Self-pay | Admitting: *Deleted

## 2014-08-30 ENCOUNTER — Encounter (HOSPITAL_BASED_OUTPATIENT_CLINIC_OR_DEPARTMENT_OTHER): Payer: Self-pay | Admitting: *Deleted

## 2014-08-30 NOTE — Progress Notes (Signed)
NPO AFTER MN. ARRIVE AT 0700. NEEDS ISTAT AND EKG. WILL TAKE AZOR AM DOS W/ SIPS OF WATER .

## 2014-09-01 ENCOUNTER — Other Ambulatory Visit: Payer: Self-pay

## 2014-09-01 ENCOUNTER — Ambulatory Visit (HOSPITAL_COMMUNITY): Payer: Medicare PPO

## 2014-09-01 ENCOUNTER — Encounter (HOSPITAL_BASED_OUTPATIENT_CLINIC_OR_DEPARTMENT_OTHER)
Admission: RE | Disposition: A | Discharge status: Home / Self Care | Payer: Self-pay | Source: Ambulatory Visit | Attending: General Surgery

## 2014-09-01 ENCOUNTER — Ambulatory Visit (HOSPITAL_BASED_OUTPATIENT_CLINIC_OR_DEPARTMENT_OTHER): Payer: Medicare PPO | Admitting: Anesthesiology

## 2014-09-01 ENCOUNTER — Ambulatory Visit (HOSPITAL_BASED_OUTPATIENT_CLINIC_OR_DEPARTMENT_OTHER)
Admission: RE | Admit: 2014-09-01 | Discharge: 2014-09-01 | Disposition: A | Discharge status: Home / Self Care | Payer: Medicare PPO | Source: Ambulatory Visit | Attending: General Surgery | Admitting: General Surgery

## 2014-09-01 ENCOUNTER — Encounter (HOSPITAL_BASED_OUTPATIENT_CLINIC_OR_DEPARTMENT_OTHER): Payer: Self-pay

## 2014-09-01 DIAGNOSIS — Z8551 Personal history of malignant neoplasm of bladder: Secondary | ICD-10-CM | POA: Insufficient documentation

## 2014-09-01 DIAGNOSIS — E780 Pure hypercholesterolemia: Secondary | ICD-10-CM | POA: Insufficient documentation

## 2014-09-01 DIAGNOSIS — Z7982 Long term (current) use of aspirin: Secondary | ICD-10-CM | POA: Insufficient documentation

## 2014-09-01 DIAGNOSIS — M1990 Unspecified osteoarthritis, unspecified site: Secondary | ICD-10-CM | POA: Insufficient documentation

## 2014-09-01 DIAGNOSIS — Z882 Allergy status to sulfonamides status: Secondary | ICD-10-CM | POA: Insufficient documentation

## 2014-09-01 DIAGNOSIS — Z419 Encounter for procedure for purposes other than remedying health state, unspecified: Secondary | ICD-10-CM

## 2014-09-01 DIAGNOSIS — R159 Full incontinence of feces: Secondary | ICD-10-CM | POA: Diagnosis present

## 2014-09-01 HISTORY — DX: Overactive bladder: N32.81

## 2014-09-01 HISTORY — DX: Full incontinence of feces: R15.9

## 2014-09-01 HISTORY — DX: Other chronic cystitis without hematuria: N30.20

## 2014-09-01 HISTORY — DX: Personal history of malignant neoplasm of other parts of uterus: Z85.42

## 2014-09-01 HISTORY — DX: Unspecified osteoarthritis, unspecified site: M19.90

## 2014-09-01 HISTORY — DX: Hyperlipidemia, unspecified: E78.5

## 2014-09-01 HISTORY — DX: Urge incontinence: N39.41

## 2014-09-01 HISTORY — DX: Personal history of irradiation: Z92.3

## 2014-09-01 HISTORY — DX: Presence of spectacles and contact lenses: Z97.3

## 2014-09-01 HISTORY — DX: Unspecified glaucoma: H40.9

## 2014-09-01 LAB — POCT I-STAT 4, (NA,K, GLUC, HGB,HCT)
Glucose, Bld: 110 mg/dL — ABNORMAL HIGH (ref 70–99)
HCT: 46 % (ref 36.0–46.0)
Hemoglobin: 15.6 g/dL — ABNORMAL HIGH (ref 12.0–15.0)
Potassium: 4.3 mmol/L (ref 3.5–5.1)
Sodium: 141 mmol/L (ref 135–145)

## 2014-09-01 SURGERY — INSERTION, NEUROSTIMULATOR, SACRAL
Anesthesia: Monitor Anesthesia Care | Site: Back

## 2014-09-01 MED ORDER — LIDOCAINE HCL (CARDIAC) 20 MG/ML IV SOLN
INTRAVENOUS | Status: DC | PRN
  Administered 2014-09-01: 50 mg via INTRAVENOUS

## 2014-09-01 MED ORDER — SODIUM CHLORIDE 0.9 % IJ SOLN
3.0000 mL | INTRAMUSCULAR | Status: DC | PRN
  Filled 2014-09-01: qty 3

## 2014-09-01 MED ORDER — ACETAMINOPHEN 325 MG PO TABS
650.0000 mg | ORAL_TABLET | ORAL | Status: DC | PRN
  Administered 2014-09-01: 650 mg via ORAL
  Filled 2014-09-01: qty 2

## 2014-09-01 MED ORDER — ONDANSETRON HCL 4 MG/2ML IJ SOLN
INTRAMUSCULAR | Status: DC | PRN
  Administered 2014-09-01: 4 mg via INTRAVENOUS

## 2014-09-01 MED ORDER — CEFAZOLIN SODIUM-DEXTROSE 2-3 GM-% IV SOLR
INTRAVENOUS | Status: DC | PRN
  Administered 2014-09-01: 2 g via INTRAVENOUS

## 2014-09-01 MED ORDER — BUPIVACAINE-EPINEPHRINE 0.5% -1:200000 IJ SOLN
INTRAMUSCULAR | Status: DC | PRN
  Administered 2014-09-01: 16 mL

## 2014-09-01 MED ORDER — ONDANSETRON HCL 4 MG/2ML IJ SOLN
4.0000 mg | Freq: Once | INTRAMUSCULAR | Status: AC
  Administered 2014-09-01: 4 mg via INTRAVENOUS
  Filled 2014-09-01: qty 2

## 2014-09-01 MED ORDER — CEFAZOLIN SODIUM-DEXTROSE 2-3 GM-% IV SOLR
INTRAVENOUS | Status: AC
  Filled 2014-09-01: qty 50

## 2014-09-01 MED ORDER — SODIUM CHLORIDE 0.9 % IV SOLN
250.0000 mL | INTRAVENOUS | Status: DC | PRN
  Filled 2014-09-01: qty 250

## 2014-09-01 MED ORDER — STERILE WATER FOR IRRIGATION IR SOLN
Status: DC | PRN
  Administered 2014-09-01: 500 mL

## 2014-09-01 MED ORDER — SODIUM CHLORIDE 0.9 % IJ SOLN
3.0000 mL | Freq: Two times a day (BID) | INTRAMUSCULAR | Status: DC
  Filled 2014-09-01: qty 3

## 2014-09-01 MED ORDER — PROPOFOL 10 MG/ML IV EMUL
INTRAVENOUS | Status: DC | PRN
  Administered 2014-09-01: 200 ug/kg/min via INTRAVENOUS

## 2014-09-01 MED ORDER — KETAMINE HCL 50 MG/ML IJ SOLN
INTRAMUSCULAR | Status: AC
  Filled 2014-09-01: qty 10

## 2014-09-01 MED ORDER — FENTANYL CITRATE 0.05 MG/ML IJ SOLN
INTRAMUSCULAR | Status: DC | PRN
  Administered 2014-09-01 (×2): 25 ug via INTRAVENOUS

## 2014-09-01 MED ORDER — KETAMINE HCL 100 MG/ML IJ SOLN
250.0000 mg | INTRAMUSCULAR | Status: DC | PRN
  Administered 2014-09-01: 10 ug/kg/min via INTRAVENOUS

## 2014-09-01 MED ORDER — LACTATED RINGERS IV SOLN
INTRAVENOUS | Status: DC
  Administered 2014-09-01 (×2): via INTRAVENOUS
  Filled 2014-09-01: qty 1000

## 2014-09-01 MED ORDER — DEXAMETHASONE SODIUM PHOSPHATE 4 MG/ML IJ SOLN
INTRAMUSCULAR | Status: DC | PRN
  Administered 2014-09-01: 12 mg via INTRAVENOUS

## 2014-09-01 MED ORDER — PROMETHAZINE HCL 25 MG/ML IJ SOLN
6.2500 mg | INTRAMUSCULAR | Status: DC | PRN
  Filled 2014-09-01: qty 1

## 2014-09-01 MED ORDER — ACETAMINOPHEN 325 MG PO TABS
ORAL_TABLET | ORAL | Status: AC
  Filled 2014-09-01: qty 2

## 2014-09-01 MED ORDER — EPHEDRINE SULFATE 50 MG/ML IJ SOLN: INTRAMUSCULAR | Status: DC | PRN

## 2014-09-01 MED ORDER — FENTANYL CITRATE 0.05 MG/ML IJ SOLN
25.0000 ug | INTRAMUSCULAR | Status: DC | PRN
  Filled 2014-09-01: qty 1

## 2014-09-01 MED ORDER — ACETAMINOPHEN 650 MG RE SUPP
650.0000 mg | RECTAL | Status: DC | PRN
  Filled 2014-09-01: qty 1

## 2014-09-01 MED ORDER — FENTANYL CITRATE 0.05 MG/ML IJ SOLN
INTRAMUSCULAR | Status: AC
  Filled 2014-09-01: qty 4

## 2014-09-01 MED ORDER — PHENYLEPHRINE HCL 10 MG/ML IJ SOLN
INTRAMUSCULAR | Status: DC | PRN
  Administered 2014-09-01: 80 ug via INTRAVENOUS
  Administered 2014-09-01 (×2): 120 ug via INTRAVENOUS
  Administered 2014-09-01: 80 ug via INTRAVENOUS

## 2014-09-01 MED ORDER — ONDANSETRON HCL 4 MG/2ML IJ SOLN
INTRAMUSCULAR | Status: AC
  Filled 2014-09-01: qty 2

## 2014-09-01 SURGICAL SUPPLY — 45 items
BLADE SURG 15 STRL LF DISP TIS (BLADE) ×1 IMPLANT
BLADE SURG 15 STRL SS (BLADE) ×2
CABLE TEST STIMULATION (UROLOGICAL SUPPLIES) ×1 IMPLANT
CABLE TWIST LOCK 25CM (UROLOGICAL SUPPLIES) ×1 IMPLANT
CHLORAPREP W/TINT 26ML (MISCELLANEOUS) ×1 IMPLANT
CLOTH BEACON ORANGE TIMEOUT ST (SAFETY) ×2 IMPLANT
COVER BACK TABLE 60X90IN (DRAPES) ×2 IMPLANT
COVER MAYO STAND STRL (DRAPES) ×2 IMPLANT
DRAPE C-ARM 42X72 X-RAY (DRAPES) ×1 IMPLANT
DRAPE INCISE 23X17 IOBAN STRL (DRAPES) ×1
DRAPE INCISE 23X17 STRL (DRAPES) IMPLANT
DRAPE INCISE IOBAN 23X17 STRL (DRAPES) ×1 IMPLANT
DRAPE LAPAROSCOPIC ABDOMINAL (DRAPES) ×2 IMPLANT
DRAPE SHEET LG 3/4 BI-LAMINATE (DRAPES) ×1 IMPLANT
DRAPE UTILITY XL STRL (DRAPES) ×1 IMPLANT
DRSG TEGADERM 4X4.75 (GAUZE/BANDAGES/DRESSINGS) ×4 IMPLANT
ELECT REM PT RETURN 9FT ADLT (ELECTROSURGICAL) ×2
ELECTRODE REM PT RTRN 9FT ADLT (ELECTROSURGICAL) IMPLANT
GLOVE BIO SURGEON STRL SZ 6.5 (GLOVE) ×3 IMPLANT
GLOVE BIOGEL PI IND STRL 6.5 (GLOVE) IMPLANT
GLOVE BIOGEL PI INDICATOR 6.5 (GLOVE) ×2
GLOVE INDICATOR 7.0 STRL GRN (GLOVE) ×2 IMPLANT
GOWN STRL REUS W/ TWL XL LVL3 (GOWN DISPOSABLE) IMPLANT
GOWN STRL REUS W/TWL 2XL LVL3 (GOWN DISPOSABLE) ×1 IMPLANT
GOWN STRL REUS W/TWL XL LVL3 (GOWN DISPOSABLE) ×2
INTRODUCER GUIDE DILATR SHEATH (SET/KITS/TRAYS/PACK) ×1 IMPLANT
KIT INTERSTIM LEAD TINED 28CM (Urological Implant) ×1 IMPLANT
LIQUID BAND (GAUZE/BANDAGES/DRESSINGS) ×2 IMPLANT
NEEDLE HYPO 22GX1.5 SAFETY (NEEDLE) ×1 IMPLANT
NEUROSTIMULATOR 1.7X2X.06 (UROLOGICAL SUPPLIES) ×1 IMPLANT
PACK BASIN DAY SURGERY FS (CUSTOM PROCEDURE TRAY) ×2 IMPLANT
PENCIL BUTTON HOLSTER BLD 10FT (ELECTRODE) ×1 IMPLANT
SPONGE GAUZE 4X4 12PLY STER LF (GAUZE/BANDAGES/DRESSINGS) ×1 IMPLANT
STRIP CLOSURE SKIN 1/2X4 (GAUZE/BANDAGES/DRESSINGS) ×2 IMPLANT
STRIP CLOSURE SKIN 1/4X4 (GAUZE/BANDAGES/DRESSINGS) ×1 IMPLANT
SUT SILK 2 0 (SUTURE) ×2
SUT SILK 2-0 18XBRD TIE 12 (SUTURE) IMPLANT
SUT VIC AB 3-0 SH 27 (SUTURE) ×2
SUT VIC AB 3-0 SH 27X BRD (SUTURE) ×1 IMPLANT
SUT VICRYL 4-0 PS2 18IN ABS (SUTURE) ×2 IMPLANT
SYR BULB IRRIGATION 50ML (SYRINGE) ×2 IMPLANT
SYR CONTROL 10ML LL (SYRINGE) ×2 IMPLANT
TOWEL OR 17X24 6PK STRL BLUE (TOWEL DISPOSABLE) ×3 IMPLANT
TRAY DSU PREP LF (CUSTOM PROCEDURE TRAY) ×1 IMPLANT
WATER STERILE IRR 500ML POUR (IV SOLUTION) ×2 IMPLANT

## 2014-09-01 NOTE — Discharge Instructions (Addendum)
GENERAL SURGERY: POST OP INSTRUCTIONS  1. DIET: Follow a light bland diet the first 24 hours after arrival home, such as soup, liquids, crackers, etc.  Be sure to include lots of fluids daily.  Avoid fast food or heavy meals as your are more likely to get nauseated.   2. Take your usually prescribed home medications unless otherwise directed. 3. PAIN CONTROL: a. Pain is best controlled by a usual combination of three different methods TOGETHER: i. Ice/Heat ii. Over the counter pain medication iii. Prescription pain medication b. Most patients will experience some swelling and bruising around the incisions.  Ice packs or heating pads (30-60 minutes up to 6 times a day) will help. Use ice for the first few days to help decrease swelling and bruising, then switch to heat to help relax tight/sore spots and speed recovery.  Some people prefer to use ice alone, heat alone, alternating between ice & heat.  Experiment to what works for you.  Swelling and bruising can take several weeks to resolve.   c. It is helpful to take an over-the-counter pain medication regularly for the first few weeks.  Choose one of the following that works best for you: i. Naproxen (Aleve, etc)  Two 220mg  tabs twice a day ii. Ibuprofen (Advil, etc) Three 200mg  tabs four times a day (every meal & bedtime) iii. If you are having problems/concerns with the pain medicine (does not control pain, nausea, vomiting, rash, itching, etc), please call us [phone] to see if we need to switch you to a different pain medicine that will work better for you and/or control your side effect better. iv. If you need a refill on your pain medication, please contact your pharmacy.  They will contact our office to request authorization. Prescriptions will not be filled after 5 pm or on week-ends. 4. Avoid getting constipated.  Between the surgery and the pain medications, it is common to experience some constipation.  Increasing fluid intake and  taking a fiber supplement (such as Metamucil, Citrucel, FiberCon, MiraLax, etc) 1-2 times a day regularly will usually help prevent this problem from occurring.  A mild laxative (prune juice, Milk of Magnesia, MiraLax, etc) should be taken according to package directions if there are no bowel movements after 48 hours.   5. Do not shower or bathe while the leads are in place. 6.  You may have skin tapes (Steri Strips) covering the incision(s).  Leave them in place.       7. ACTIVITIES as tolerated:   a. You may resume regular (light) daily activities beginning the next day--such as daily self-care, walking, climbing stairs--gradually increasing activities as tolerated.  If you can walk 30 minutes without difficulty, it is safe to try more intense activity such as jogging, treadmill, bicycling, low-impact aerobics, swimming, etc. b. Save the most intensive and strenuous activity for last such as sit-ups, heavy lifting, contact sports, etc  Refrain from any heavy lifting or straining until you are off narcotics for pain control.   c. DO NOT PUSH THROUGH PAIN.  Let pain be your guide: If it hurts to do something, don't do it.  Pain is your body warning you to avoid that activity for another week until the pain goes down. d. You may drive when you are no longer taking prescription pain medication, you can comfortably wear a seatbelt, and you can safely maneuver your car and apply brakes. e. Dennis Bast may have sexual intercourse when it is comfortable.  8. FOLLOW UP  in our office a. Please call [facility] at [phone] to set up an appointment to see your surgeon in the office for a follow-up appointment approximately 2-3 weeks after your surgery. b. Make sure that you call for this appointment the day you arrive home to insure a convenient appointment time. 9. IF YOU HAVE DISABILITY OR FAMILY LEAVE FORMS, BRING THEM TO THE OFFICE FOR PROCESSING.  DO NOT GIVE THEM TO YOUR DOCTOR.   WHEN TO CALL us [phone]  [phone]: 1. Poor pain control 2. Reactions / problems with new medications (rash/itching, nausea, etc)  3. Fever over 101.5 F (38.5 C) 4. Worsening swelling or bruising 5. Continued bleeding from incision. 6. Increased pain, redness, or drainage from the incision   The clinic staff is available to answer your questions during regular business hours (8:30am-5pm).  Please dont hesitate to call and ask to speak to one of our nurses for clinical concerns.   If you have a medical emergency, go to the nearest emergency room or call 911.  A surgeon from [facility] is always on call at the [facility], [place], [place], [place], [place]  [address] ? MAIN: [phone] ? TOLL FREE: [phone] ?  FAX [phone] [email or web address]   Post Anesthesia Home Care Instructions  Activity: Get plenty of rest for the remainder of the day. A responsible adult should stay with you for 24 hours following the procedure.  For the next 24 hours, DO NOT: -Drive a car -Paediatric nurse -Drink alcoholic beverages -Take any medication unless instructed by your physician -Make any legal decisions or sign important papers.  Meals: Start with liquid foods such as gelatin or soup. Progress to regular foods as tolerated. Avoid greasy, spicy, heavy foods. If nausea and/or vomiting occur, drink only clear liquids until the nausea and/or vomiting subsides. Call your physician if vomiting continues.  Special Instructions/Symptoms: Your throat may feel dry or sore from the anesthesia or the breathing tube placed in your throat during surgery. If this causes discomfort, gargle with warm salt water. The discomfort should disappear within 24 hours.  If you had a scopolamine patch placed behind your ear for the management of post- operative nausea and/or vomiting:  1. The medication in the patch is effective for 72 hours, after which it  should be removed.  Wrap patch in a tissue and discard in the trash. Wash hands thoroughly with soap and water. 2. You may remove the patch earlier than 72 hours if you experience unpleasant side effects which may include dry mouth, dizziness or visual disturbances. 3. Avoid touching the patch. Wash your hands with soap and water after contact with the patch.

## 2014-09-01 NOTE — Interval H&P Note (Signed)
History and Physical Interval Note:  09/01/2014 8:13 AM  Janet Cooper  has presented today for surgery, with the diagnosis of fecal incontinance  The various methods of treatment have been discussed with the patient and family. After consideration of risks, benefits and other options for treatment, the patient has consented to  Procedure(s): SACRAL NERVE STIMULATOR IMPLANTATION (N/A) as a surgical intervention .  The patient's history has been reviewed, patient examined, no change in status, stable for surgery.  I have reviewed the patient's chart and labs.  Questions were answered to the patient's satisfaction.     Rosario Adie, MD  [facility]

## 2014-09-01 NOTE — Anesthesia Postprocedure Evaluation (Signed)
  Anesthesia Post-op Note  Patient: Janet Cooper  Procedure(s) Performed: Procedure(s) (LRB): SACRAL NERVE LEAD IMPLANTATION -STAGE ONE INTERSTIM (N/A)  Patient Location: PACU  Anesthesia Type: MAC  Level of Consciousness: awake and alert   Airway and Oxygen Therapy: Patient Spontanous Breathing  Post-op Pain: mild  Post-op Assessment: Post-op Vital signs reviewed, Patient's Cardiovascular Status Stable, Respiratory Function Stable, Patent Airway and No signs of Nausea or vomiting  Last Vitals:  Filed Vitals:   09/01/14 1218  BP: 118/80  Pulse: 82  Temp: 36.4 C  Resp: 16    Post-op Vital Signs: stable   Complications: No apparent anesthesia complications

## 2014-09-01 NOTE — Anesthesia Preprocedure Evaluation (Addendum)
Anesthesia Evaluation  Patient identified by MRN, date of birth, ID band Patient awake    Reviewed: Allergy & Precautions, NPO status , Patient's Chart, lab work & pertinent test results  Airway Mallampati: II  TM Distance: >3 FB Neck ROM: Full    Dental no notable dental hx. (+) Teeth Intact, Dental Advisory Given   Pulmonary neg pulmonary ROS,  breath sounds clear to auscultation  Pulmonary exam normal       Cardiovascular Exercise Tolerance: Good hypertension, Pt. on medications Rhythm:Regular Rate:Normal     Neuro/Psych  Neuromuscular disease negative psych ROS   GI/Hepatic negative GI ROS, Neg liver ROS,   Endo/Other  negative endocrine ROS  Renal/GU negative Renal ROS  negative genitourinary   Musculoskeletal  (+) Arthritis -, Osteoarthritis,  Fx wrist Left   Abdominal   Peds negative pediatric ROS (+)  Hematology negative hematology ROS (+)   Anesthesia Other Findings   Reproductive/Obstetrics negative OB ROS                          Anesthesia Physical Anesthesia Plan  ASA: II  Anesthesia Plan: MAC   Post-op Pain Management:    Induction: Intravenous  Airway Management Planned:   Additional Equipment:   Intra-op Plan:   Post-operative Plan:   Informed Consent: I have reviewed the patients History and Physical, chart, labs and discussed the procedure including the risks, benefits and alternatives for the proposed anesthesia with the patient or authorized representative who has indicated his/her understanding and acceptance.   Dental advisory given  Plan Discussed with: CRNA  Anesthesia Plan Comments:         Anesthesia Quick Evaluation

## 2014-09-01 NOTE — H&P (View-Only) (Signed)
Janet Cooper 08/23/2014 11:54 AM Location: [facility] Patient #: [id number] DOB: [birth year] Widowed / Language: [name] / Race: Asian Female History of Present Illness [name]; 10/12/2583 12:19 PM) Patient words: fecal incontinence.  The patient is a 81 year old female who presents with anal itching. This 81 year old female who is well known to me. She has symptoms of urgency and fecal incontinence which we have been treating medically. She has tried multiple types of fiber and Imodium. She continues to have multiple bowel movements a day with severe urgency and fecal incontinence. She also has a symptom that feels like her rectum is falling out. She has no rectal prolapse that could be seen on physical exam or MRI. She has completed a 2 week bowel diary. She is having multiple bowel movements a day, up to 26. Her urgency ratings are mostly fours and fives. She is having 2-3 episodes of leakage a day as well. Other Problems [name], MD; 2/77/8242 12:19 PM) Arthritis Back Pain Bladder Problems Cancer High blood pressure Hypercholesterolemia FECAL INCONTINENCE (787.60  R15.9)  Past Surgical History [name], MD; 3/53/6144 12:19 PM) Cataract Surgery Bilateral. Hysterectomy (due to cancer) - Complete Knee Surgery Left.  Diagnostic Studies History [name], MD; 08/17/4006 12:19 PM) Colonoscopy 5-10 years ago Mammogram 1-3 years ago Pap Smear 1-5 years ago  Allergies [name], [name]; 08/23/2014 11:56 AM) HYDROcodone Bitartrate *CHEMICALS* Sulfa Antibiotics  Medication History [name], [name]; 08/23/2014 11:56 AM) Aspirin EC (81MG  Tablet DR, Oral) Active. Fish Oil Active. Combigan (0.2-0.5% Solution, Ophthalmic daily) Active. Latanoprost (0.005% Solution, Ophthalmic daily) Active. FML (0.1% Ointment, Ophthalmic as needed) Active. Simvastatin (20MG  Tablet, Oral daily) Active. Azor (5-40MG  Tablet, Oral  daily) Active. Medications Reconciled  Social History [name], MD; 6/76/1948 12:19 PM) Alcohol use Occasional alcohol use. Caffeine use Carbonated beverages, Coffee, Tea. No drug use Tobacco use Never smoker.  Family History [name], MD; 9/32/6712 12:20 PM) Cancer Father. Colon Cancer Mother. Heart Disease Sister. Hypertension Mother.    Vitals [name] [name]; 08/23/2014 11:57 AM) 08/23/2014 11:56 AM Weight: 119 lb Height: 61in Body Surface Area: 1.52 m Body Mass Index: 22.48 kg/m Temp.: 59F(Temporal)  Pulse: 68 (Regular)  Resp.: 16 (Unlabored)  BP: 120/68 (Sitting, Left Arm, Standard)     Physical Exam [name]; 4/58/0998 12:20 PM)  General Mental Status-Alert. General Appearance-Consistent with stated age. Hydration-Well hydrated. Voice-Normal.  Head and Neck Head-normocephalic, atraumatic with no lesions or palpable masses. Trachea-midline. Thyroid Gland Characteristics - normal size and consistency.  Eye Eyeball - Bilateral-Extraocular movements intact. Sclera/Conjunctiva - Bilateral-No scleral icterus.  Chest and Lung Exam Chest and lung exam reveals -quiet, even and easy respiratory effort with no use of accessory muscles and on auscultation, normal breath sounds, no adventitious sounds and normal vocal resonance. Inspection Chest Wall - Normal. Back - normal.  Cardiovascular Cardiovascular examination reveals -normal heart sounds, regular rate and rhythm with no murmurs and normal pedal pulses bilaterally.  Abdomen Inspection Inspection of the abdomen reveals - No Hernias. Palpation/Percussion Palpation and Percussion of the abdomen reveal - Soft, Non Tender, No Rebound tenderness, No Rigidity (guarding) and No hepatosplenomegaly. Auscultation Auscultation of the abdomen reveals - Bowel sounds normal.  Neurologic Neurologic evaluation reveals -alert and oriented x 3 with no  impairment of recent or remote memory. Mental Status-Normal.  Musculoskeletal Global Assessment -Note:no gross deformities.  Normal Exam - Left-Upper Extremity Strength Normal and Lower Extremity Strength Normal. Normal Exam - Right-Upper Extremity Strength Normal and Lower  Extremity Strength Normal.    Assessment & Plan [name]; 6/38/1771 12:17 PM)  FECAL INCONTINENCE (787.60  R15.9) Impression: Pt with ongoing fecal frequency and urgency despite medical therapy and physical rehab (PTENS). Anal manometry shows a weak internal sphincter but normal external sphincter. There was also no RAIR noted. She has completed a bowel diarrhea which shows multiple episodes of leakage and severe urgency. We have discussed the risk and benefits of sacral nerve stimulator. She has agreed to proceed with surgery. Risks include failure of the device, infection and pain.

## 2014-09-01 NOTE — Transfer of Care (Signed)
Immediate Anesthesia Transfer of Care Note  Patient: Janet Cooper  Procedure(s) Performed: Procedure(s): SACRAL NERVE LEAD IMPLANTATION -STAGE ONE INTERSTIM (N/A)  Patient Location: PACU  Anesthesia Type:MAC  Level of Consciousness: awake, alert , oriented and patient cooperative  Airway & Oxygen Therapy: Patient Spontanous Breathing and Patient connected to nasal cannula oxygen  Post-op Assessment: Report given to RN and Post -op Vital signs reviewed and stable  Post vital signs: Reviewed and stable  Last Vitals:  Filed Vitals:   09/01/14 1045  BP: 132/85  Pulse: 77  Temp:   Resp: 14    Complications: No apparent anesthesia complications

## 2014-09-01 NOTE — Anesthesia Procedure Notes (Signed)
Procedure Name: MAC Date/Time: 09/01/2014 8:35 AM Performed by: Wanita Chamberlain Pre-anesthesia Checklist: Patient identified, Timeout performed, Emergency Drugs available, Suction available and Patient being monitored Patient Re-evaluated:Patient Re-evaluated prior to inductionOxygen Delivery Method: Nasal cannula Intubation Type: IV induction Placement Confirmation: positive ETCO2 Dental Injury: Teeth and Oropharynx as per pre-operative assessment

## 2014-09-01 NOTE — Op Note (Signed)
09/01/2014  9:45 AM  PATIENT:  Janet Cooper  81 y.o. female  Patient Care Team: Linton Rump, PA as PCP - General (Internal Medicine)  PRE-OPERATIVE DIAGNOSIS:  fecal incontinance  POST-OPERATIVE DIAGNOSIS:  fecal incontinance  PROCEDURE:SACRAL NERVE LEAD IMPLANTATION -STAGE ONE INTERSTIM  SURGEON:  Surgeon(s): [name], MD  ASSISTANT: none   ANESTHESIA:   local and MAC  EBL:  Total I/O In: 700 [I.V.:700] Out: -   DRAINS: none   SPECIMEN:  No Specimen  DISPOSITION OF SPECIMEN:  N/A  COUNTS:  YES  PLAN OF CARE: Discharge to home after PACU  PATIENT DISPOSITION:  PACU - hemodynamically stable.  INDICATION: Fecal incontinence refractory to medical treatment.  The patient suffers from fecal incontinence ~ 10-15 episodes per day.  she has tried imodium and fiber to manage this without success.  Anal manometry shows severely weakened pelvic floor muscles.   The risks and benefits of the surgery were described to the patient and consent was signed and placed on chart prior to the OR.  DESCRIPTION: the patient was identified in the preoperative holding area and taken to the OR where they were laid prone on the operating room table.  MAC anesthesia was induced without difficulty. SCDs were also noted to be in place prior to the initiation of anesthesia.  Pillows were placed under lower abdomen to flatten the sacrum and under shins to allow the toes to dangle freely. A ground pad was placed on the bottom of the patient's foot and the proximal ends of the j-hook patient cable were connected to the ground pad and the external neurostimulator (ENS).The patient was then prepped and draped in the usual sterile fashion.  A surgical timeout was performed indicating the correct patient, procedure, positioning and need for preoperative antibiotics.  The c-arm was moved into AP position to provide fluoroscopic guidance of the sacrum. The medial edges of the foramina were identified  and marked. The c-arm was then moved into the lateral position to identify the S3 foramen. Once the needle entry point was determined, local injection of marcaine with epinephrine was administered bilaterally. A foramen needle was placed in the superior, medial aspect of the left S3 foramen and appropriate needle depth was visualized utilizing fluoroscopy. Proper S3 needle location was also confirmed by direct observation of the lifting of the perineum or "bellowing," and plantar flexion of the great toe utilizing the j-hook patient cable, the external neurostimulator and Verify controller. The foramen needle stylet was removed and a directional guide was placed through the needle using markers on the guide to assure appropriate depth. The foramen needle was removed by sliding over the directional guide. A small incision was made peripherally to the directional guide through the skin. The lead introducer with dilator was placed over the directional guide and utilizing fluoroscopic guidance, the lead introducer was advanced until the radiopaque mark was half-way through the foramen. The dilator was removed along with the directional guide. Using fluoroscopy, the tined lead with a bent stylet was placed through the introducer until electrodes two and three straddled the anterior surface of the sacrum. All four electrodes were tested, observing "bellows" and plantar flexion of the great toe utilizing the j-hook patient cable and the external neurostimulator with Verify controller. After satisfactory lead positioning was confirmed, the introducer was retracted over the lead under continuous fluoroscopy, deploying the tines into presacral tissue. Retesting of all four electrodes confirming appropriate responses was completed.   The potential internal neurostimulator pocket site  was identified below the iliac crest and lateral to the sacrum. Local anesthesia was administered and an incision was made into the  subcutaneous tissue creating a connection site. Blunt dissection was used to create a small pocket with hemostasis achieved.  A tunneling tool with sheath was placed from the lead exit site subcutaneously to the small incised pocket site. The tunneling tool was removed and the lead was fed through the sheath, exiting at the pocket connection site. The sheath was removed. The lead was cleaned and dried. A protective boot was placed over the lead; the lead was inserted into the temporary percutaneous extension with visual confirmation of blue tip advancement. The four setscrews were tightened with the torque wrench until audible clicks were heard. The boot was slid over the connection and 2 2-0 silk ties were sutured to the boot grooves on either side of connection. Using the tunneling tool and sheath, a subcutaneous tunnel was created from the pocket site to the contralateral buttock and exited at a localized site. The percutaneous extension was placed through the sheath, the sheath removed, and the extension exiting the site. The connection components were placed into the incision. The incisions were closed with 3-0 Vicryl subcuticular sutures and 4-0 Vicryl skin sutures. The incisions were covered with Dermabond.  Adhesive strips and gauze were placed over the percutaneous extension exit site and secured with transparent dressing.   The percutaneous extension was attached to the external white twist lock cable. Gauze was placed under the twist-lock connection with cable strain relief and secured using transparent dressing. The twist lock cable was then plugged into the external neuorstimulator. The patient was transferred to PACU in satisfactory condition. Using the Verify controller and external neurostimulator, the patient was programmed to the electrode of optimum sensation and provided utilization instructions prior to discharge. Patient will complete a voiding diary during testing period to help document  results of this test procedure.

## 2014-09-03 ENCOUNTER — Encounter (HOSPITAL_BASED_OUTPATIENT_CLINIC_OR_DEPARTMENT_OTHER): Payer: Self-pay | Admitting: *Deleted

## 2014-09-03 NOTE — Progress Notes (Addendum)
Spoke w pt. Pt confirmed no change in hx since last week. Pt instructed npo pmn 4/5 x azor w sip of water.  To [facility] 4/6 @ 1130.  Needs istat on arrival.

## 2014-09-07 ENCOUNTER — Encounter (HOSPITAL_BASED_OUTPATIENT_CLINIC_OR_DEPARTMENT_OTHER): Payer: Self-pay | Admitting: Anesthesiology

## 2014-09-07 NOTE — Anesthesia Preprocedure Evaluation (Deleted)
Anesthesia Evaluation  Patient identified by MRN, date of birth, ID band Patient awake    Reviewed: Allergy & Precautions, NPO status , Patient's Chart, lab work & pertinent test results  History of Anesthesia Complications Negative for: history of anesthetic complications  Airway Mallampati: II  TM Distance: >3 FB Neck ROM: Full    Dental no notable dental hx. (+) Dental Advisory Given   Pulmonary neg pulmonary ROS,  breath sounds clear to auscultation  Pulmonary exam normal       Cardiovascular hypertension, Pt. on medications Rhythm:Regular Rate:Normal     Neuro/Psych negative neurological ROS  negative psych ROS   GI/Hepatic negative GI ROS, Neg liver ROS,   Endo/Other  negative endocrine ROS  Renal/GU negative Renal ROS Bladder dysfunction      Musculoskeletal  (+) Arthritis -,   Abdominal   Peds negative pediatric ROS (+)  Hematology negative hematology ROS (+)   Anesthesia Other Findings   Reproductive/Obstetrics negative OB ROS                            Anesthesia Physical Anesthesia Plan  ASA: II  Anesthesia Plan: MAC   Post-op Pain Management:    Induction: Intravenous  Airway Management Planned: Nasal Cannula  Additional Equipment:   Intra-op Plan:   Post-operative Plan:   Informed Consent: I have reviewed the patients History and Physical, chart, labs and discussed the procedure including the risks, benefits and alternatives for the proposed anesthesia with the patient or authorized representative who has indicated his/her understanding and acceptance.   Dental advisory given  Plan Discussed with: CRNA  Anesthesia Plan Comments:        Anesthesia Quick Evaluation

## 2014-09-08 ENCOUNTER — Ambulatory Visit (HOSPITAL_BASED_OUTPATIENT_CLINIC_OR_DEPARTMENT_OTHER): Admission: RE | Admit: 2014-09-08 | Payer: Medicare PPO | Source: Ambulatory Visit | Admitting: General Surgery

## 2014-09-08 SURGERY — INSERTION, PULSE GENERATOR, NEUROSTIMULATOR, GASTRIC
Anesthesia: Monitor Anesthesia Care

## 2014-09-09 ENCOUNTER — Encounter (HOSPITAL_BASED_OUTPATIENT_CLINIC_OR_DEPARTMENT_OTHER): Payer: Self-pay | Admitting: *Deleted

## 2014-09-09 NOTE — Progress Notes (Addendum)
PT RESCHEDULED FROM 09-08-2014 TO 09-15-2014.  NPO AFTER MN. ARRIVE AT [id number]. WILL TAKE AZOR AM DOS W/ SIPS OF WATER.  CURRRENT ISTAT AND EKG IN CHART AND EPIC.

## 2014-09-15 ENCOUNTER — Ambulatory Visit (HOSPITAL_BASED_OUTPATIENT_CLINIC_OR_DEPARTMENT_OTHER): Payer: Medicare PPO | Admitting: Anesthesiology

## 2014-09-15 ENCOUNTER — Encounter (HOSPITAL_BASED_OUTPATIENT_CLINIC_OR_DEPARTMENT_OTHER): Payer: Self-pay | Admitting: *Deleted

## 2014-09-15 ENCOUNTER — Ambulatory Visit (HOSPITAL_BASED_OUTPATIENT_CLINIC_OR_DEPARTMENT_OTHER)
Admission: RE | Admit: 2014-09-15 | Discharge: 2014-09-15 | Disposition: A | Discharge status: Home / Self Care | Payer: Medicare PPO | Source: Ambulatory Visit | Attending: General Surgery | Admitting: General Surgery

## 2014-09-15 ENCOUNTER — Encounter (HOSPITAL_BASED_OUTPATIENT_CLINIC_OR_DEPARTMENT_OTHER)
Admission: RE | Disposition: A | Discharge status: Home / Self Care | Payer: Self-pay | Source: Ambulatory Visit | Attending: General Surgery

## 2014-09-15 DIAGNOSIS — Z79899 Other long term (current) drug therapy: Secondary | ICD-10-CM | POA: Insufficient documentation

## 2014-09-15 DIAGNOSIS — Z7982 Long term (current) use of aspirin: Secondary | ICD-10-CM | POA: Insufficient documentation

## 2014-09-15 DIAGNOSIS — M1990 Unspecified osteoarthritis, unspecified site: Secondary | ICD-10-CM | POA: Diagnosis not present

## 2014-09-15 DIAGNOSIS — E780 Pure hypercholesterolemia: Secondary | ICD-10-CM | POA: Diagnosis not present

## 2014-09-15 DIAGNOSIS — I10 Essential (primary) hypertension: Secondary | ICD-10-CM | POA: Diagnosis not present

## 2014-09-15 DIAGNOSIS — R159 Full incontinence of feces: Secondary | ICD-10-CM | POA: Diagnosis present

## 2014-09-15 HISTORY — PX: OTHER SURGICAL HISTORY: SHX169

## 2014-09-15 SURGERY — INSERTION, NEUROSTIMULATOR, SACRAL
Anesthesia: Monitor Anesthesia Care | Site: Buttocks

## 2014-09-15 MED ORDER — CEFAZOLIN SODIUM 1-5 GM-% IV SOLN
INTRAVENOUS | Status: DC | PRN
  Administered 2014-09-15: 1 g via INTRAVENOUS

## 2014-09-15 MED ORDER — FENTANYL CITRATE 0.05 MG/ML IJ SOLN
INTRAMUSCULAR | Status: AC
  Filled 2014-09-15: qty 6

## 2014-09-15 MED ORDER — KETOROLAC TROMETHAMINE 30 MG/ML IJ SOLN
INTRAMUSCULAR | Status: DC | PRN
  Administered 2014-09-15: 15 mg via INTRAVENOUS

## 2014-09-15 MED ORDER — ONDANSETRON HCL 4 MG/2ML IJ SOLN
4.0000 mg | Freq: Once | INTRAMUSCULAR | Status: DC | PRN
  Filled 2014-09-15: qty 2

## 2014-09-15 MED ORDER — ONDANSETRON HCL 4 MG/2ML IJ SOLN
INTRAMUSCULAR | Status: DC | PRN
  Administered 2014-09-15: 4 mg via INTRAVENOUS

## 2014-09-15 MED ORDER — MIDAZOLAM HCL 2 MG/2ML IJ SOLN
INTRAMUSCULAR | Status: AC
  Filled 2014-09-15: qty 4

## 2014-09-15 MED ORDER — SCOPOLAMINE 1 MG/3DAYS TD PT72
1.0000 | MEDICATED_PATCH | TRANSDERMAL | Status: DC
  Administered 2014-09-15: 1 via TRANSDERMAL
  Filled 2014-09-15 (×2): qty 1

## 2014-09-15 MED ORDER — STERILE WATER FOR IRRIGATION IR SOLN
Status: DC | PRN
  Administered 2014-09-15: 500 mL

## 2014-09-15 MED ORDER — LIDOCAINE HCL (CARDIAC) 20 MG/ML IV SOLN
INTRAVENOUS | Status: DC | PRN
  Administered 2014-09-15: 40 mg via INTRAVENOUS

## 2014-09-15 MED ORDER — DEXAMETHASONE SODIUM PHOSPHATE 4 MG/ML IJ SOLN
INTRAMUSCULAR | Status: DC | PRN
  Administered 2014-09-15: 4 mg via INTRAVENOUS

## 2014-09-15 MED ORDER — LACTATED RINGERS IV SOLN
INTRAVENOUS | Status: DC
  Administered 2014-09-15: 09:00:00 via INTRAVENOUS
  Filled 2014-09-15: qty 1000

## 2014-09-15 MED ORDER — MIDAZOLAM HCL 5 MG/5ML IJ SOLN
INTRAMUSCULAR | Status: DC | PRN
  Administered 2014-09-15 (×4): 0.5 mg via INTRAVENOUS

## 2014-09-15 MED ORDER — HYDROMORPHONE HCL 1 MG/ML IJ SOLN
0.2500 mg | INTRAMUSCULAR | Status: DC | PRN
  Filled 2014-09-15: qty 1

## 2014-09-15 MED ORDER — PROPOFOL 10 MG/ML IV EMUL
INTRAVENOUS | Status: DC | PRN
  Administered 2014-09-15: 50 ug/kg/min via INTRAVENOUS

## 2014-09-15 MED ORDER — MEPERIDINE HCL 25 MG/ML IJ SOLN
6.2500 mg | INTRAMUSCULAR | Status: DC | PRN
  Filled 2014-09-15: qty 1

## 2014-09-15 MED ORDER — FENTANYL CITRATE 0.05 MG/ML IJ SOLN
INTRAMUSCULAR | Status: DC | PRN
  Administered 2014-09-15 (×4): 12.5 ug via INTRAVENOUS

## 2014-09-15 SURGICAL SUPPLY — 52 items
ANTNA NRSTM XTRN TELEM NS LF (UROLOGICAL SUPPLIES) ×1
APL SKNCLS STERI-STRIP NONHPOA (GAUZE/BANDAGES/DRESSINGS)
BAG URINE LEG 500ML (DRAIN) IMPLANT
BENZOIN TINCTURE PRP APPL 2/3 (GAUZE/BANDAGES/DRESSINGS) IMPLANT
BLADE HEX COATED 2.75 (ELECTRODE) ×2 IMPLANT
BLADE SURG 15 STRL LF DISP TIS (BLADE) ×1 IMPLANT
BLADE SURG 15 STRL SS (BLADE) ×2
CATH FOLEY 2WAY SLVR  5CC 16FR (CATHETERS)
CATH FOLEY 2WAY SLVR 5CC 16FR (CATHETERS) IMPLANT
CHLORAPREP W/TINT 26ML (MISCELLANEOUS) ×2 IMPLANT
COVER BACK TABLE 60X90IN (DRAPES) ×2 IMPLANT
COVER MAYO STAND STRL (DRAPES) ×2 IMPLANT
COVER PROBE W GEL 5X96 (DRAPES) IMPLANT
DRAPE C-ARM 42X72 X-RAY (DRAPES) IMPLANT
DRAPE INCISE IOBAN 66X45 STRL (DRAPES) ×2 IMPLANT
DRAPE LAPAROSCOPIC ABDOMINAL (DRAPES) ×2 IMPLANT
DRAPE LG THREE QUARTER DISP (DRAPES) ×2 IMPLANT
DRAPE UTILITY XL STRL (DRAPES) ×2 IMPLANT
DRSG TEGADERM 4X4.75 (GAUZE/BANDAGES/DRESSINGS) ×2 IMPLANT
ELECT REM PT RETURN 9FT ADLT (ELECTROSURGICAL) ×2
ELECTRODE REM PT RTRN 9FT ADLT (ELECTROSURGICAL) ×1 IMPLANT
GLOVE BIO SURGEON STRL SZ 6.5 (GLOVE) ×2 IMPLANT
GLOVE BIOGEL M 6.5 STRL (GLOVE) ×1 IMPLANT
GLOVE BIOGEL PI IND STRL 6.5 (GLOVE) IMPLANT
GLOVE BIOGEL PI IND STRL 7.5 (GLOVE) IMPLANT
GLOVE BIOGEL PI INDICATOR 6.5 (GLOVE) ×1
GLOVE BIOGEL PI INDICATOR 7.5 (GLOVE) ×1
GLOVE INDICATOR 7.0 STRL GRN (GLOVE) ×2 IMPLANT
GOWN STRL REUS W/TWL 2XL LVL3 (GOWN DISPOSABLE) ×2 IMPLANT
GOWN STRL REUS W/TWL LRG LVL3 (GOWN DISPOSABLE) ×1 IMPLANT
INTRODUCER GUIDE DILATR SHEATH (SET/KITS/TRAYS/PACK) IMPLANT
LIQUID BAND (GAUZE/BANDAGES/DRESSINGS) ×2 IMPLANT
NEEDLE FORAMEN 20GA 3.5  9CM (NEEDLE) IMPLANT
NEEDLE FORAMEN 20GA 5  12.5CM (NEEDLE) IMPLANT
NEEDLE HYPO 22GX1.5 SAFETY (NEEDLE) ×2 IMPLANT
PACK BASIN DAY SURGERY FS (CUSTOM PROCEDURE TRAY) ×2 IMPLANT
PENCIL BUTTON HOLSTER BLD 10FT (ELECTRODE) ×2 IMPLANT
PROGRAMMER ANTENNA EXT (UROLOGICAL SUPPLIES) ×1 IMPLANT
PROGRAMMER STIMUL 2.2X1.1X3.7 (UROLOGICAL SUPPLIES) ×1 IMPLANT
SPONGE GAUZE 4X4 12PLY STER LF (GAUZE/BANDAGES/DRESSINGS) IMPLANT
STAPLER VISISTAT 35W (STAPLE) IMPLANT
STIMULATOR INTERSTIM 2X1.7X.3 (Orthopedic Implant) ×1 IMPLANT
STRIP CLOSURE SKIN 1/2X4 (GAUZE/BANDAGES/DRESSINGS) ×2 IMPLANT
SUT SILK 2 0 TIES 17X18 (SUTURE)
SUT SILK 2-0 18XBRD TIE BLK (SUTURE) IMPLANT
SUT VIC AB 3-0 SH 27 (SUTURE) ×2
SUT VIC AB 3-0 SH 27X BRD (SUTURE) ×1 IMPLANT
SUT VICRYL 4-0 PS2 18IN ABS (SUTURE) ×2 IMPLANT
SYR BULB IRRIGATION 50ML (SYRINGE) ×2 IMPLANT
SYR CONTROL 10ML LL (SYRINGE) ×2 IMPLANT
TOWEL OR 17X24 6PK STRL BLUE (TOWEL DISPOSABLE) ×4 IMPLANT
WATER STERILE IRR 500ML POUR (IV SOLUTION) ×2 IMPLANT

## 2014-09-15 NOTE — Anesthesia Postprocedure Evaluation (Signed)
Anesthesia Post Note  Patient: Janet Cooper  Procedure(s) Performed: Procedure(s) (LRB): SACRAL NERVE STIMULATOR IMPLANTATION WITH REMOVAL OF TEST WIRE (N/A)  Anesthesia type: general  Patient location: PACU  Post pain: Pain level controlled  Post assessment: Patient's Cardiovascular Status Stable  Last Vitals:  Filed Vitals:   09/15/14 1200  BP:   Pulse: 82  Temp: 36 C  Resp: 16    Post vital signs: Reviewed and stable  Level of consciousness: sedated  Complications: No apparent anesthesia complications

## 2014-09-15 NOTE — H&P (View-Only) (Signed)
Janet Cooper 08/23/2014 11:54 AM Location: [facility] Patient #: [id number] DOB: 10/25/1929 Widowed / Language: Janet Cooper Molt / Race: Asian Female History of Present Illness [name]; 06/04/7508 12:19 PM) Patient words: fecal incontinence.  The patient is a 81 year old female who presents with anal itching. This 81 year old female who is well known to me. She has symptoms of urgency and fecal incontinence which we have been treating medically. She has tried multiple types of fiber and Imodium. She continues to have multiple bowel movements a day with severe urgency and fecal incontinence. She also has a symptom that feels like her rectum is falling out. She has no rectal prolapse that could be seen on physical exam or MRI. She has completed a 2 week bowel diary. She is having multiple bowel movements a day, up to 26. Her urgency ratings are mostly fours and fives. She is having 2-3 episodes of leakage a day as well. Other Problems [name], MD; 2/58/5277 12:19 PM) Arthritis Back Pain Bladder Problems Cancer High blood pressure Hypercholesterolemia FECAL INCONTINENCE (787.60  R15.9)  Past Surgical History [name], MD; 01/26/2352 12:19 PM) Cataract Surgery Bilateral. Hysterectomy (due to cancer) - Complete Knee Surgery Left.  Diagnostic Studies History [name], MD; 11/15/4313 12:19 PM) Colonoscopy 5-10 years ago Mammogram 1-3 years ago Pap Smear 1-5 years ago  Allergies [name], [name]; 08/23/2014 11:56 AM) HYDROcodone Bitartrate *CHEMICALS* Sulfa Antibiotics  Medication History [name], [name]; 08/23/2014 11:56 AM) Aspirin EC (81MG  Tablet DR, Oral) Active. Fish Oil Active. Combigan (0.2-0.5% Solution, Ophthalmic daily) Active. Latanoprost (0.005% Solution, Ophthalmic daily) Active. FML (0.1% Ointment, Ophthalmic as needed) Active. Simvastatin (20MG  Tablet, Oral daily) Active. Azor (5-40MG  Tablet, Oral  daily) Active. Medications Reconciled  Social History [name], MD; 4/00/8676 12:19 PM) Alcohol use Occasional alcohol use. Caffeine use Carbonated beverages, Coffee, Tea. No drug use Tobacco use Never smoker.  Family History [name], MD; 1/95/0932 12:20 PM) Cancer Father. Colon Cancer Mother. Heart Disease Sister. Hypertension Mother.    Vitals [name] [name]; 08/23/2014 11:57 AM) 08/23/2014 11:56 AM Weight: 119 lb Height: 61in Body Surface Area: 1.52 m Body Mass Index: 22.48 kg/m Temp.: 69F(Temporal)  Pulse: 68 (Regular)  Resp.: 16 (Unlabored)  BP: 120/68 (Sitting, Left Arm, Standard)     Physical Exam [name]; 6/71/2458 12:20 PM)  General Mental Status-Alert. General Appearance-Consistent with stated age. Hydration-Well hydrated. Voice-Normal.  Head and Neck Head-normocephalic, atraumatic with no lesions or palpable masses. Trachea-midline. Thyroid Gland Characteristics - normal size and consistency.  Eye Eyeball - Bilateral-Extraocular movements intact. Sclera/Conjunctiva - Bilateral-No scleral icterus.  Chest and Lung Exam Chest and lung exam reveals -quiet, even and easy respiratory effort with no use of accessory muscles and on auscultation, normal breath sounds, no adventitious sounds and normal vocal resonance. Inspection Chest Wall - Normal. Back - normal.  Cardiovascular Cardiovascular examination reveals -normal heart sounds, regular rate and rhythm with no murmurs and normal pedal pulses bilaterally.  Abdomen Inspection Inspection of the abdomen reveals - No Hernias. Palpation/Percussion Palpation and Percussion of the abdomen reveal - Soft, Non Tender, No Rebound tenderness, No Rigidity (guarding) and No hepatosplenomegaly. Auscultation Auscultation of the abdomen reveals - Bowel sounds normal.  Neurologic Neurologic evaluation reveals -alert and oriented x 3 with no  impairment of recent or remote memory. Mental Status-Normal.  Musculoskeletal Global Assessment -Note:no gross deformities.  Normal Exam - Left-Upper Extremity Strength Normal and Lower Extremity Strength Normal. Normal Exam - Right-Upper Extremity Strength Normal and Lower  Extremity Strength Normal.    Assessment & Plan [name]; 9/84/2103 12:17 PM)  FECAL INCONTINENCE (787.60  R15.9) Impression: Pt with ongoing fecal frequency and urgency despite medical therapy and physical rehab (PTENS). Anal manometry shows a weak internal sphincter but normal external sphincter. There was also no RAIR noted. She has completed a bowel diarrhea which shows multiple episodes of leakage and severe urgency. We have discussed the risk and benefits of sacral nerve stimulator. She has agreed to proceed with surgery. Risks include failure of the device, infection and pain.

## 2014-09-15 NOTE — Op Note (Signed)
09/15/2014  10:24 AM  PATIENT:  Janet Cooper  81 y.o. female  Patient Care Team: Linton Rump, PA as PCP - General (Internal Medicine)  PRE-OPERATIVE DIAGNOSIS:  fecal inctontinue  POST-OPERATIVE DIAGNOSIS:  fecal inctontinue  PROCEDURE: SACRAL NERVE STIMULATOR IMPLANTATION STAGE 2  SURGEON:  Surgeon(s): [name], MD  ASSISTANT: none   ANESTHESIA:   local and MAC  EBL:  Total I/O In: 100 [I.V.:100] Out: -   DRAINS: none   SPECIMEN:  No Specimen  DISPOSITION OF SPECIMEN:  N/A  COUNTS:  YES  PLAN OF CARE: Discharge to home after PACU  PATIENT DISPOSITION:  PACU - hemodynamically stable.  INDICATION: Fecal incontinence.  The patient has underwent the test phase for 14 days with stimulator turned on and off and in different programs.  She has had a significant decrease in fecal leakage episodes since then.  This is a 72% reduction in fecal incontinence when comparing her bowel diary before and after implantation.  The patient agrees to proceed with full implantation of the neuro stimulator device.  OR FINDINGS: Wire lead intact.  Impedence values < 1000.  DESCRIPTION: The patient was identified in the preoperative holding area and taken to the OR where they were laid supine on the operating room table.  MAC anesthesia was induced without difficulty. SCDs were also noted to be in place prior to the initiation of anesthesia.  The patient was then prepped and draped in the usual sterile fashion.   A surgical timeout was performed indicating the correct patient, procedure, positioning and need for preoperative antibiotics.  Local injection of Marcaine with epinephrine was administered.  The previous upper buttock incision was opened using blunt dissection and the lead/percutaneous connection was identified and evaluated. The percutaneous extension was cut on the braided wire and was removed from the field ensuring sterility. The subcutaneous pocket was enlarged to  accommodate the internal neurostimulator (INS) and hemostasis was established. The sutures securing the protective boot were cut and the boot retracted to expose the four set screws. Using the torque wrench, the screws were loosened and the percutaneous extension was disconnected from the lead. The boot and connection were removed and Discarded.  The lead was cleaned and dried. The lead was inserted into the header of the InterStim neurostimulator until the blue tip was visualized at the distal window. The single set screw was tightened using the torque wrench until audible click was heard. The neurostimulator was placed into the pocket with the etched identification side placed upwards and any excessive lead was placed around the neurostimulator. The clinician programmer telemetry head, covered in a sterile sleeve, was placed over the implanted neurostimulator to ensure proper lead connection and that parameters were within normal range. Impedances were confirmed to be within normal limits, greater than 50 and less than 4,000 ohms. The wound was irrigated with sterile water and closed with a running 3-0 Vicryl subcuticular suture and a 4-0 Vicryl running skin suture. Counts were correct. Dermabond was placed over the incision. The patient was transferred to the PACU in satisfactory condition. Using the clinician programmer, the INS was programmed.   Patient was provided utilization instructions for the patient programmer prior to discharge.

## 2014-09-15 NOTE — Anesthesia Procedure Notes (Signed)
Procedure Name: MAC Date/Time: 09/15/2014 9:49 AM Performed by: Justice Rocher Pre-anesthesia Checklist: Patient identified, Timeout performed, Emergency Drugs available, Suction available and Patient being monitored Patient Re-evaluated:Patient Re-evaluated prior to inductionOxygen Delivery Method: Simple face mask Preoxygenation: Pre-oxygenation with 100% oxygen Intubation Type: IV induction Placement Confirmation: positive ETCO2 and breath sounds checked- equal and bilateral

## 2014-09-15 NOTE — Anesthesia Preprocedure Evaluation (Addendum)
Anesthesia Evaluation  Patient identified by MRN, date of birth, ID band Patient awake    Reviewed: Allergy & Precautions, NPO status , Patient's Chart, lab work & pertinent test results  History of Anesthesia Complications (+) PONV  Airway Mallampati: I  TM Distance: >3 FB Neck ROM: Full    Dental   Pulmonary    Pulmonary exam normal       Cardiovascular hypertension, Pt. on medications     Neuro/Psych    GI/Hepatic   Endo/Other    Renal/GU      Musculoskeletal   Abdominal   Peds  Hematology   Anesthesia Other Findings   Reproductive/Obstetrics                           Anesthesia Physical Anesthesia Plan  ASA: II  Anesthesia Plan: MAC   Post-op Pain Management:    Induction: Intravenous  Airway Management Planned: Natural Airway  Additional Equipment:   Intra-op Plan:   Post-operative Plan:   Informed Consent: I have reviewed the patients History and Physical, chart, labs and discussed the procedure including the risks, benefits and alternatives for the proposed anesthesia with the patient or authorized representative who has indicated his/her understanding and acceptance.     Plan Discussed with: CRNA and Surgeon  Anesthesia Plan Comments:        Anesthesia Quick Evaluation

## 2014-09-15 NOTE — Transfer of Care (Signed)
Immediate Anesthesia Transfer of Care Note  Patient: Janet Cooper  Procedure(s) Performed: Procedure(s) (LRB): SACRAL NERVE STIMULATOR IMPLANTATION WITH REMOVAL OF TEST WIRE (N/A)  Patient Location: PACU  Anesthesia Type: MAC  Level of Consciousness: awake, sedated, patient cooperative and responds to stimulation  Airway & Oxygen Therapy: Patient Spontanous Breathing and Patient on RA  Post-op Assessment: Report given to PACU RN, Post -op Vital signs reviewed and stable and Patient moving all extremities  Post vital signs: Reviewed and stable  Complications: No apparent anesthesia complications

## 2014-09-15 NOTE — Interval H&P Note (Signed)
History and Physical Interval Note:  09/15/2014 9:17 AM  Janet Cooper  has presented today for surgery, with the diagnosis of feccal inctontinue  The various methods of treatment have been discussed with the patient and family. After consideration of risks, benefits and other options for treatment, the patient has consented to  Procedure(s): SACRAL NERVE STIMULATOR IMPLANTATION OR REMOVAL OF TEST WIRE (N/A) as a surgical intervention .  The patient's history has been reviewed, patient examined, no change in status, stable for surgery.  I have reviewed the patient's chart and labs.  Questions were answered to the patient's satisfaction.     We had a discussion of her symptoms with the test device.  She has had ~75% improvement in her leakage with the device on.  She has not had much improvement in her urgency though.  Given the leakage improvement, I have recommended insertion of the device.  We will try to improve the urgency by adding fiber back into her regimen. She has decided to proceed with the surgery.    Rosario Adie, MD  [facility]

## 2014-09-15 NOTE — Discharge Instructions (Addendum)
POST OP INSTRUCTIONS  Always review your discharge instruction sheet given to you by the facility where your surgery was performed.   1. A prescription for pain medication may be given to you upon discharge. Take your pain medication as prescribed, if needed. If narcotic pain medicine is not needed, then you make take acetaminophen (Tylenol) or ibuprofen (Advil) as needed.  2. Take your usually prescribed medications unless otherwise directed. 3. If you need a refill on your pain medication, please contact our office. All narcotic pain medicine now requires a paper prescription.  Phoned in and fax refills are no longer allowed by law.  Prescriptions will not be filled after 5 pm or on weekends.  4. You should follow a light diet for the remainder of the day after your procedure. 5. Most patients will experience some mild swelling and/or bruising in the area of the incision. It may take several days to resolve. 6. It is common to experience some constipation if taking pain medication after surgery. Increasing fluid intake and taking a stool softener (such as Colace) will usually help or prevent this problem from occurring. A mild laxative (Milk of Magnesia or Miralax) should be taken according to package directions if there are no bowel movements after 48 hours.  7. Your surgeon used Dermabond (skin glue) on the incision, you may shower in 24 hours.  The glue will flake off over the next 2-3 weeks.  8. ACTIVITIES:  Limit activity involving your arms for the next 72 hours. Do no strenuous exercise or activity for 1 week. You may drive when you are no longer taking prescription pain medication, you can comfortably wear a seatbelt, and you can maneuver your car. 10.You may need to see your doctor in the office for a follow-up appointment.  Please       check with your doctor.  11.You will get a product guide and remote.  Please keep them in case you need them.  WHEN TO CALL YOUR DOCTOR  [phone]): 1. Fever over 101.0 2. Chills 3. Continued bleeding from incision 4. Increased redness and tenderness at the site 5. Shortness of breath, difficulty breathing   The clinic staff is available to answer your questions during regular business hours. Please dont hesitate to call and ask to speak to one of the nurses or medical assistants for clinical concerns. If you have a medical emergency, go to the nearest emergency room or call 911.  A surgeon from [facility] is always on call at the hospital.     For further information, please visit [email or web address]      Post Anesthesia Home Care Instructions  Activity: Get plenty of rest for the remainder of the day. A responsible adult should stay with you for 24 hours following the procedure.  For the next 24 hours, DO NOT: -Drive a car -Paediatric nurse -Drink alcoholic beverages -Take any medication unless instructed by your physician -Make any legal decisions or sign important papers.  Meals: Start with liquid foods such as gelatin or soup. Progress to regular foods as tolerated. Avoid greasy, spicy, heavy foods. If nausea and/or vomiting occur, drink only clear liquids until the nausea and/or vomiting subsides. Call your physician if vomiting continues.  Special Instructions/Symptoms: Your throat may feel dry or sore from the anesthesia or the breathing tube placed in your throat during surgery. If this causes discomfort, gargle with warm salt water. The discomfort should disappear within 24 hours.  If you had a scopolamine patch placed  behind your ear for the management of post- operative nausea and/or vomiting:  1. The medication in the patch is effective for 72 hours, after which it should be removed.  Wrap patch in a tissue and discard in the trash. Wash hands thoroughly with soap and water. 2. You may remove the patch earlier than 72 hours if you experience unpleasant side effects  which may include dry mouth, dizziness or visual disturbances. 3. Avoid touching the patch. Wash your hands with soap and water after contact with the patch.    Post Anesthesia Home Care Instructions  Activity: Get plenty of rest for the remainder of the day. A responsible adult should stay with you for 24 hours following the procedure.  For the next 24 hours, DO NOT: -Drive a car -Paediatric nurse -Drink alcoholic beverages -Take any medication unless instructed by your physician -Make any legal decisions or sign important papers.  Meals: Start with liquid foods such as gelatin or soup. Progress to regular foods as tolerated. Avoid greasy, spicy, heavy foods. If nausea and/or vomiting occur, drink only clear liquids until the nausea and/or vomiting subsides. Call your physician if vomiting continues.  Special Instructions/Symptoms: Your throat may feel dry or sore from the anesthesia or the breathing tube placed in your throat during surgery. If this causes discomfort, gargle with warm salt water. The discomfort should disappear within 24 hours.  If you had a scopolamine patch placed behind your ear for the management of post- operative nausea and/or vomiting:  1. The medication in the patch is effective for 72 hours, after which it should be removed.  Wrap patch in a tissue and discard in the trash. Wash hands thoroughly with soap and water. 2. You may remove the patch earlier than 72 hours if you experience unpleasant side effects which may include dry mouth, dizziness or visual disturbances. 3. Avoid touching the patch. Wash your hands with soap and water after contact with the patch.

## 2014-11-29 ENCOUNTER — Other Ambulatory Visit: Payer: Self-pay

## 2014-12-30 ENCOUNTER — Telehealth: Payer: Self-pay

## 2014-12-30 NOTE — Telephone Encounter (Signed)
Received records from [place] forwarded 49 pages to [facility] 12/30/14 [facility]bg

## 2014-12-31 ENCOUNTER — Telehealth: Payer: Self-pay | Admitting: Internal Medicine

## 2014-12-31 NOTE — Telephone Encounter (Signed)
Records denied.

## 2015-07-19 ENCOUNTER — Ambulatory Visit: Payer: Medicare Other | Attending: Gastroenterology | Admitting: Physical Therapy

## 2015-07-19 ENCOUNTER — Encounter: Payer: Self-pay | Admitting: Physical Therapy

## 2015-07-19 DIAGNOSIS — N8184 Pelvic muscle wasting: Secondary | ICD-10-CM | POA: Insufficient documentation

## 2015-07-19 DIAGNOSIS — N907 Vulvar cyst: Secondary | ICD-10-CM | POA: Diagnosis not present

## 2015-07-19 DIAGNOSIS — N8189 Other female genital prolapse: Secondary | ICD-10-CM

## 2015-07-19 DIAGNOSIS — M6289 Other specified disorders of muscle: Secondary | ICD-10-CM

## 2015-07-19 NOTE — Therapy (Signed)
[facility] [address] [place], [place], [address] Phone: [phone]   Fax:  [phone]  Physical Therapy Evaluation  Patient Details  Name: Janet Cooper MRN: [id number] Date of Birth: 13-Oct-1929 Referring Provider: Dr. Lanelle Bal Ransohoff  Encounter Date: 07/19/2015      PT End of Session - 07/19/15 2047    Visit Number 1   Number of Visits 10  Medicare   Date for PT Re-Evaluation 09/13/15   PT Start Time 1615   PT Stop Time 1655   PT Time Calculation (min) 40 min   Activity Tolerance Patient tolerated treatment well   Behavior During Therapy Anxious;WFL for tasks assessed/performed      Past Medical History  Diagnosis Date  . Hypertension   . Osteopenia 05/2013    T score -2.1 FRAX 8.9%/2.5%  . Hyperlipidemia   . History of endometrial cancer     1990 dx  Stage IB, Grade 1 endometrial carcinoma  s/p TAH w/ BSO--  recurrence 1994 vaginal cuff  s/p radiation  . Fecal incontinence   . Urge urinary incontinence   . OAB (overactive bladder)   . Chronic cystitis   . Detrusor instability of bladder   . Glaucoma of both eyes   . History of radiation therapy     1994  lower pelvic area for recurrent endometrial cancer at vaginal cuff  . Arthritis     back  . Fx. left wrist     06-05-2014--  no surgical intervention-- is wearing brace  . Wears glasses     Past Surgical History  Procedure Laterality Date  . Patella fracture surgery Left 1995  . Anal rectal manometry N/A 03/29/2014    Procedure: ANO RECTAL MANOMETRY;  Surgeon: [name], MD;  Location: [facility];  Service: Endoscopy;  Laterality: N/A;  . Anal rectal manometry N/A 04/28/2014    Procedure: ANO RECTAL MANOMETRY;  Surgeon: [name], MD;  Location: [facility];  Service: Endoscopy;  Laterality: N/A;  . Total abdominal hysterectomy w/ bilateral salpingoophorectomy  1990  . Cataract extraction w/ intraocular lens   implant, bilateral  1990's  . Glaucoma surgery Bilateral 2010    laser  . Sacral nerve lead implantation -- stage one interstim  [id number]   dr [name]    fecal incontinence    There were no vitals filed for this visit.  Visit Diagnosis:  Weakness of perineal floor - Plan: PT plan of care cert/re-cert  Pelvic floor dysfunction - Plan: PT plan of care cert/re-cert      Subjective Assessment - 07/19/15 1623    Subjective Patient has a sacral stimulator for fecal incontinence but it is not helping so she will have the machine removed. Patient has had issue of fecal incontinence sicne 2007 and it is getting worse.  Paient  wears 12 pads per day.  Pateint reports she is unable to empty her bowel and little pieces come out.  Whdn patient gets up from the commode she has the urge to have a bowel movement.  Patient used a Regulatory affairs officer for the anal canal for the sphinter strength but had burning in the anal canal.    How long can you walk comfortably? feeling of something is pushing on her bottom and feeling of having a bowel movement.    Patient Stated Goals reduce incontinence   Currently in Pain? No/denies            [facility] PT Assessment -  07/19/15 0001    Assessment   Medical Diagnosis N81.84 Pelvic floor Dysfunction; Fecal incontinence R15.9   Referring Provider Dr. Lanelle Bal Ransohoff   Onset Date/Surgical Date 06/04/92  side effect from radiation treatment   Prior Therapy Yes   Precautions   Precautions Other (comment)   Precaution Comments cancer precaution due to radiation treatment in pelvis; Patient has a sacral nerve stimulator implantation that can be turned offf   Balance Screen   Has the patient fallen in the past 6 months No   Has the patient had a decrease in activity level because of a fear of falling?  No   Is the patient reluctant to leave their home because of a fear of falling?  No   Prior Function   Level of Independence Independent    Vocation Retired   Leisure walks daily   Cognition   Overall Cognitive Status Within Functional Limits for tasks assessed   Observation/Other Assessments   Observations Patient is anxious   Focus on Therapeutic Outcomes (FOTO)  100% limitaiton  goal is 80% limitation   Posture/Postural Control   Posture/Postural Control No significant limitations   AROM   Overall AROM Comments lumbar ROM is full   Strength   Overall Strength Comments bil. hip flexion and extension 4/                 Pelvic Floor Special Questions - 07/19/15 0001    Currently Sexually Active No   Urinary Leakage Yes   Pad use 12   Activities that cause leaking With strong urge   Urinary urgency Yes  fecal urgency   Urinary frequency every 5 min   Fecal incontinence Yes   Fluid intake 3 glasses   Caffeine beverages 4 cups   Skin Integrity Intact;Hemorroids  skin is dark around the anal area   Pelvic Floor Internal Exam Patient confirms identification and approves physical therapist to assess muscle strength and integrity   Exam Type Vaginal;Rectal   Palpation puborectalis muscle needs tactile cues to come forward, no tenderness located in pelvic floor muscles   Strength weak squeeze, no lift  rectal 3/5   Tone low tone                  PT Education - 07/19/15 2047    Education provided No          PT Short Term Goals - 07/19/15 2103    PT SHORT TERM GOAL #1   Title independent with initial HEP   Time 4   Period Weeks   Status New   PT SHORT TERM GOAL #2   Title understand correct toileting technique to fully empty her bowels   Time 4   Period Weeks   Status New   PT SHORT TERM GOAL #3   Title understand abdominal massage to assist in movement of bowels in the intestines   Time 4   Period Weeks   Status New   PT SHORT TERM GOAL #4   Title understand how to avoid the urge to have a bowel movement using behavioral techniques   Time 4   Period Weeks   Status New            PT Long Term Goals - 07/19/15 2105    PT LONG TERM GOAL #1   Title independent with HEP and how to progress herself   Time 8   Period Weeks   Status New   PT LONG TERM  GOAL #2   Title fecal incontinence decreased by 50% due to increased rectal strength >/= 5/5 holding for 10 sec   Time 8   Period Weeks   Status New   PT LONG TERM GOAL #3   Title use 6 or less pads due to reduction of fecal incontinence   Time 8   Period Weeks   Status New   PT LONG TERM GOAL #4   Title ability to relax the pelvic floor so she is able to fully empty her bowels and go less than 6 times per day   Time 8   Period Weeks   Status New   PT LONG TERM GOAL #5   Title urgency to have a bowel movement is intermittent and decreased >/= 50%   Time 8   Period Weeks   Status New               Plan - Jul 27, 2015 2049-09-11    Clinical Impression Statement Patient is a 82 year old female with diagnosis of pelvic floor dysfunction and fecal incontinence.  Patient reports it has become worse in the past 6 months. Patient reports she started to have incontinence issues when she had cancer outside the ovary  with radiation treatment  second time in 1992-09-11.  Patient reports no pain just sense of constant urgency to have a bowel movement. Patient  has 24 bowel movements per day with nuggets instead of a full bowel movement.  Patient reports fecal and urinary incontinence  Patient wears 12 pads per day.  Bilateral hip flexion and extension 4/5.  Low tone of the anal musculature and puborectalis requires tactile cues to pull forward.  Rectal strength is 3/5 with tactile cues and vaginal strength is 2/5. During palpation the vaginal length is shortened. FOTO score is 100%.  Patient is of moderate complexity due to pelvic floor weakness that makes it diff icult for her to socialize and be anxious about having an accident.  Patient has history of cancer with radiation treatment decreasing extensicbility of the vaginal  length , and has a sacral neurstimulator thatt has not helped. Patient has had physical therapy in the past with electrical stimulation but she felt burning when the internal anal sensor was in. Patient will benefit from skilled therapy to strengthen the pelvic  floor to reduce leakage.    Pt will benefit from skilled therapeutic intervention in order to improve on the following deficits Decreased coordination;Decreased mobility;Increased muscle spasms;Decreased activity tolerance;Decreased strength;Increased fascial restricitons;Decreased endurance   Rehab Potential Good   Clinical Impairments Affecting Rehab Potential History of cancer with radiation outside the ovary; Osteoporosis   PT Frequency 1x / week   PT Duration 8 weeks   PT Treatment/Interventions Biofeedback;Therapeutic exercise;Therapeutic activities;Neuromuscular re-education;Patient/family education;Manual techniques;Dry needling   PT Next Visit Plan abdominal massage; toileting technique; bladder irritants; pelvic floor contraction   PT Home Exercise Plan correct pelvic floor contraction; correct toileting technique; abdominal massage   Recommended Other Services [facility]   Consulted and Agree with Plan of Care Patient          G-Codes - 2015/07/27 2047-09-12    Functional Assessment Tool Used FOTO score is 100% limitation  goal is 80% limitation   Functional Limitation Other PT primary   Other PT Primary Current Status [id number]) 100 percent impaired, limited or restricted   Other PT Primary Goal Status [id number]) At least 80 percent but less than 100 percent impaired, limited or restricted  Problem List Patient Active Problem List   Diagnosis Date Noted  . Rectocele 09/16/2013    Earlie Counts, PT 07/19/2015 9:10 PM   [facility] [address], [place], [place], [address] Phone: [phone]   Fax:  [phone]  Name: GIANA KLOCKO MRN: [id number] Date of  Birth: [birth year]

## 2015-07-25 ENCOUNTER — Ambulatory Visit: Payer: Medicare Other | Admitting: Physical Therapy

## 2015-07-25 ENCOUNTER — Encounter: Payer: Self-pay | Admitting: Physical Therapy

## 2015-07-25 DIAGNOSIS — M6289 Other specified disorders of muscle: Secondary | ICD-10-CM

## 2015-07-25 DIAGNOSIS — N8189 Other female genital prolapse: Secondary | ICD-10-CM

## 2015-07-25 DIAGNOSIS — N907 Vulvar cyst: Secondary | ICD-10-CM | POA: Diagnosis not present

## 2015-07-25 NOTE — Therapy (Signed)
[facility] [address], [place], [place], [address] Phone: [phone]   Fax:  [phone]  Physical Therapy Treatment  Patient Details  Name: Janet Cooper MRN: [id number] Date of Birth: 05/27/1930 Referring Provider: Dr. Lanelle Bal Ransohoff  Encounter Date: 07/25/2015      PT End of Session - 07/25/15 1408    Visit Number 2   Number of Visits 10  medicare   Date for PT Re-Evaluation 09/13/15   Authorization Type kx modifier 15 visits   PT Start Time 1402   PT Stop Time 1440   PT Time Calculation (min) 38 min   Activity Tolerance Patient tolerated treatment well   Behavior During Therapy Anxious;WFL for tasks assessed/performed      Past Medical History  Diagnosis Date  . Hypertension   . Osteopenia 05/2013    T score -2.1 FRAX 8.9%/2.5%  . Hyperlipidemia   . History of endometrial cancer     1990 dx  Stage IB, Grade 1 endometrial carcinoma  s/p TAH w/ BSO--  recurrence 1994 vaginal cuff  s/p radiation  . Fecal incontinence   . Urge urinary incontinence   . OAB (overactive bladder)   . Chronic cystitis   . Detrusor instability of bladder   . Glaucoma of both eyes   . History of radiation therapy     1994  lower pelvic area for recurrent endometrial cancer at vaginal cuff  . Arthritis     back  . Fx. left wrist     06-05-2014--  no surgical intervention-- is wearing brace  . Wears glasses     Past Surgical History  Procedure Laterality Date  . Patella fracture surgery Left 1995  . Anal rectal manometry N/A 03/29/2014    Procedure: ANO RECTAL MANOMETRY;  Surgeon: [name], MD;  Location: [facility];  Service: Endoscopy;  Laterality: N/A;  . Anal rectal manometry N/A 04/28/2014    Procedure: ANO RECTAL MANOMETRY;  Surgeon: [name], MD;  Location: [facility];  Service: Endoscopy;  Laterality: N/A;  . Total abdominal hysterectomy w/ bilateral salpingoophorectomy  1990  .  Cataract extraction w/ intraocular lens  implant, bilateral  1990's  . Glaucoma surgery Bilateral 2010    laser  . Sacral nerve lead implantation -- stage one interstim  [id number]   dr [name]    fecal incontinence    There were no vitals filed for this visit.  Visit Diagnosis:  Weakness of perineal floor  Pelvic floor dysfunction      Subjective Assessment - 07/25/15 1406    Subjective I do not feel good. Patient has not eaten since last night. I feel like my bowels are stuck.  I have been very sad. I get very nervous.  I am scared I have Alzheimers.    How long can you walk comfortably? feeling of something is pushing on her bottom and feeling of having a bowel movement.    Patient Stated Goals reduce incontinence   Currently in Pain? No/denies                         [facility] Adult PT Treatment/Exercise - 07/25/15 0001    Therapeutic Activites    Therapeutic Activities Other Therapeutic Activities   Other Therapeutic Activities how to sit on commode to bear down to get the bowels out   Manual Therapy   Manual Therapy Soft tissue mobilization   Soft tissue mobilization scar massage  on abdomen; abdominal massage to improve bowel movements                PT Education - 07/25/15 1440    Education provided Yes   Education Details toileting technique, abdominal massage   Person(s) Educated Patient   Methods Explanation;Demonstration;Verbal cues;Handout   Comprehension Returned demonstration;Verbalized understanding          PT Short Term Goals - 07/25/15 1410    PT SHORT TERM GOAL #1   Title independent with initial HEP   Time 4   Period Weeks   Status New   PT SHORT TERM GOAL #2   Title understand correct toileting technique to fully empty her bowels   Time 4   Period Weeks   Status New   PT SHORT TERM GOAL #3   Title understand abdominal massage to assist in movement of bowels in the intestines   Time 4   Period Weeks   Status New    PT SHORT TERM GOAL #4   Title understand how to avoid the urge to have a bowel movement using behavioral techniques   Time 4   Period Weeks   Status New           PT Long Term Goals - 07/19/15 2105    PT LONG TERM GOAL #1   Title independent with HEP and how to progress herself   Time 8   Period Weeks   Status New   PT LONG TERM GOAL #2   Title fecal incontinence decreased by 50% due to increased rectal strength >/= 5/5 holding for 10 sec   Time 8   Period Weeks   Status New   PT LONG TERM GOAL #3   Title use 6 or less pads due to reduction of fecal incontinence   Time 8   Period Weeks   Status New   PT LONG TERM GOAL #4   Title ability to relax the pelvic floor so she is able to fully empty her bowels and go less than 6 times per day   Time 8   Period Weeks   Status New   PT LONG TERM GOAL #5   Title urgency to have a bowel movement is intermittent and decreased >/= 50%   Time 8   Period Weeks   Status New               Plan - 07/25/15 1441    Clinical Impression Statement Patient is a 82 year old female with daignosis of pelvic floor dysfunction and fecal incontinence.  Patient is very anxious about many things.  Patient limits her eating so she does not have fecal incontinence.  Patient has the constant urge to go to the bathroom.  Patient does not have any family around to talk to.  Patient had incresae abdominal softness after softtissue work.  Patient has difficulty with diaphragmatic breathing.  Patient has not met goals due to just starting therapy.  Patient would benefit from physical therapy to reduce fecal incontinence.    Pt will benefit from skilled therapeutic intervention in order to improve on the following deficits Decreased coordination;Decreased mobility;Increased muscle spasms;Decreased activity tolerance;Decreased strength;Increased fascial restricitons;Decreased endurance   Rehab Potential Good   Clinical Impairments Affecting Rehab  Potential History of cancer with radiation outside the ovary; Osteoporosis   PT Frequency 1x / week   PT Duration 8 weeks   PT Treatment/Interventions Biofeedback;Therapeutic exercise;Therapeutic activities;Neuromuscular re-education;Patient/family education;Manual techniques;Dry needling   PT Next  Visit Plan diaphragmatic breathing; bladder irritants, pelvic floor contraction    PT Home Exercise Plan diaphragmatic breathing, bladder irrtants   Recommended Other Services None   Consulted and Agree with Plan of Care Patient        Problem List Patient Active Problem List   Diagnosis Date Noted  . Rectocele 09/16/2013    Earlie Counts, PT 07/25/2015 2:46 PM   [facility] [address] [place], [place], [address] Phone: [phone]   Fax:  [phone]  Name: Janet Cooper MRN: [id number] Date of Birth: 07/14/1929

## 2015-07-25 NOTE — Patient Instructions (Signed)
Toileting Techniques for Bowel Movements (Defecation) Using your belly (abdomen) and pelvic floor muscles to have a bowel movement is usually instinctive.  Sometimes people can have problems with these muscles and have to relearn proper defecation (emptying) techniques.  If you have weakness in your muscles, organs that are falling out, decreased sensation in your pelvis, or ignore your urge to go, you may find yourself straining to have a bowel movement.  You are straining if you are: . holding your breath or taking in a huge gulp of air and holding it  . keeping your lips and jaw tensed and closed tightly . turning red in the face because of excessive pushing or forcing . developing or worsening your  hemorrhoids . getting faint while pushing . not emptying completely and have to defecate many times a day  If you are straining, you are actually making it harder for yourself to have a bowel movement.  Many people find they are pulling up with the pelvic floor muscles and closing off instead of opening the anus. Due to lack pelvic floor relaxation and coordination the abdominal muscles, one has to work harder to push the feces out.  Many people have never been taught how to defecate efficiently and effectively.  Notice what happens to your body when you are having a bowel movement.  While you are sitting on the toilet pay attention to the following areas: . Jaw and mouth position . Angle of your hips   . Whether your feet touch the ground or not . Arm placement  . Spine position . Waist . Belly tension . Anus (opening of the anal canal)  An Evacuation/Defecation Plan   Here are the 4 basic points:  1. Lean forward enough for your elbows to rest on your knees 2. Support your feet on the floor or use a low stool if your feet don't touch the floor  3. Push out your belly as if you have swallowed a beach ball-you should feel a widening of your waist 4. Open and relax your pelvic floor muscles,  rather than tightening around the anus      The following conditions my require modifications to your toileting posture:  . If you have had surgery in the past that limits your back, hip, pelvic, knee or ankle flexibility . Constipation   Your healthcare practitioner may make the following additional suggestions and adjustments:  1) Sit on the toilet  a) Make sure your feet are supported. b) Notice your hip angle and spine position-most people find it effective to lean forward or raise their knees, which can help the muscles around the anus to relax  c) When you lean forward, place your forearms on your thighs for support  2) Relax suggestions a) Breath deeply in through your nose and out slowly through your mouth as if you are smelling the flowers and blowing out the candles. b) To become aware of how to relax your muscles, contracting and releasing muscles can be helpful.  Pull your pelvic floor muscles in tightly by using the image of holding back gas, or closing around the anus (visualize making a circle smaller) and lifting the anus up and in.  Then release the muscles and your anus should drop down and feel open. Repeat 5 times ending with the feeling of relaxation. c) Keep your pelvic floor muscles relaxed; let your belly bulge out. d) The digestive tract starts at the mouth and ends at the anal opening, so be   sure to relax both ends of the tube.  Place your tongue on the roof of your mouth with your teeth separated.  This helps relax your mouth and will help to relax the anus at the same time.  3) Empty (defecation) a) Keep your pelvic floor and sphincter relaxed, then bulge your anal muscles.  Make the anal opening wide.  b) Stick your belly out as if you have swallowed a beach ball. c) Make your belly wall hard using your belly muscles while continuing to breathe. Doing this makes it easier to open your anus. d) Breath out and give a grunt (or try using other sounds such as  ahhhh, shhhhh, ohhhh or grrrrrrr).  4) Finish a) As you finish your bowel movement, pull the pelvic floor muscles up and in.  This will leave your anus in the proper place rather than remaining pushed out and down. If you leave your anus pushed out and down, it will start to feel as though that is normal and give you incorrect signals about needing to have a bowel movement.    Janet Cooper, PT [facility] [address] [address] [place], [place] [address]  Toileting Techniques for Bowel Movements (Defecation) Using your belly (abdomen) and pelvic floor muscles to have a bowel movement is usually instinctive.  Sometimes people can have problems with these muscles and have to relearn proper defecation (emptying) techniques.  If you have weakness in your muscles, organs that are falling out, decreased sensation in your pelvis, or ignore your urge to go, you may find yourself straining to have a bowel movement.  You are straining if you are: . holding your breath or taking in a huge gulp of air and holding it  . keeping your lips and jaw tensed and closed tightly . turning red in the face because of excessive pushing or forcing . developing or worsening your  hemorrhoids . getting faint while pushing . not emptying completely and have to defecate many times a day  If you are straining, you are actually making it harder for yourself to have a bowel movement.  Many people find they are pulling up with the pelvic floor muscles and closing off instead of opening the anus. Due to lack pelvic floor relaxation and coordination the abdominal muscles, one has to work harder to push the feces out.  Many people have never been taught how to defecate efficiently and effectively.  Notice what happens to your body when you are having a bowel movement.  While you are sitting on the toilet pay attention to the following areas: . Jaw and mouth position . Angle of your hips   . Whether your  feet touch the ground or not . Arm placement  . Spine position . Waist . Belly tension . Anus (opening of the anal canal)  An Evacuation/Defecation Plan   Here are the 4 basic points:  5. Lean forward enough for your elbows to rest on your knees 6. Support your feet on the floor or use a low stool if your feet don't touch the floor  7. Push out your belly as if you have swallowed a beach ball-you should feel a widening of your waist 8. Open and relax your pelvic floor muscles, rather than tightening around the anus      The following conditions my require modifications to your toileting posture:  . If you have had surgery in the past that limits your back, hip, pelvic, knee or  ankle flexibility . Constipation   Your healthcare practitioner may make the following additional suggestions and adjustments:  4) Sit on the toilet  a) Make sure your feet are supported. b) Notice your hip angle and spine position-most people find it effective to lean forward or raise their knees, which can help the muscles around the anus to relax  c) When you lean forward, place your forearms on your thighs for support  5) Relax suggestions a) Breath deeply in through your nose and out slowly through your mouth as if you are smelling the flowers and blowing out the candles. b) To become aware of how to relax your muscles, contracting and releasing muscles can be helpful.  Pull your pelvic floor muscles in tightly by using the image of holding back gas, or closing around the anus (visualize making a circle smaller) and lifting the anus up and in.  Then release the muscles and your anus should drop down and feel open. Repeat 5 times ending with the feeling of relaxation. c) Keep your pelvic floor muscles relaxed; let your belly bulge out. d) The digestive tract starts at the mouth and ends at the anal opening, so be sure to relax both ends of the tube.  Place your tongue on the roof of your mouth with your  teeth separated.  This helps relax your mouth and will help to relax the anus at the same time.  6) Empty (defecation) a) Keep your pelvic floor and sphincter relaxed, then bulge your anal muscles.  Make the anal opening wide.  b) Stick your belly out as if you have swallowed a beach ball. c) Make your belly wall hard using your belly muscles while continuing to breathe. Doing this makes it easier to open your anus. d) Breath out and give a grunt (or try using other sounds such as ahhhh, shhhhh, ohhhh or grrrrrrr).  4) Finish a) As you finish your bowel movement, pull the pelvic floor muscles up and in.  This will leave your anus in the proper place rather than remaining pushed out and down. If you leave your anus pushed out and down, it will start to feel as though that is normal and give you incorrect signals about needing to have a bowel movement.    Janet Cooper, PT [facility] [address] [place] [address] [place], [place] [address]

## 2015-08-01 ENCOUNTER — Ambulatory Visit: Payer: Medicare Other | Admitting: Physical Therapy

## 2015-08-01 ENCOUNTER — Encounter: Payer: Self-pay | Admitting: Physical Therapy

## 2015-08-01 DIAGNOSIS — M6289 Other specified disorders of muscle: Secondary | ICD-10-CM

## 2015-08-01 DIAGNOSIS — N907 Vulvar cyst: Secondary | ICD-10-CM | POA: Diagnosis not present

## 2015-08-01 DIAGNOSIS — N8189 Other female genital prolapse: Secondary | ICD-10-CM

## 2015-08-01 NOTE — Therapy (Signed)
[facility] [address] [place], [place], [address] Phone: [phone]   Fax:  [phone]  Physical Therapy Treatment  Patient Details  Name: Janet Cooper MRN: [id number] Date of Birth: August 26, 1929 Referring Provider: Dr. Lanelle Bal Ransohoff  Encounter Date: 08/01/2015      PT End of Session - 08/01/15 1409    Visit Number 3   Number of Visits 10  Medicare   Date for PT Re-Evaluation 09/13/15   Authorization Type kx modifier 15 visits; g-code   PT Start Time 1400   PT Stop Time 1440   PT Time Calculation (min) 40 min   Activity Tolerance Patient tolerated treatment well   Behavior During Therapy Anxious;WFL for tasks assessed/performed      Past Medical History  Diagnosis Date  . Hypertension   . Osteopenia 05/2013    T score -2.1 FRAX 8.9%/2.5%  . Hyperlipidemia   . History of endometrial cancer     1990 dx  Stage IB, Grade 1 endometrial carcinoma  s/p TAH w/ BSO--  recurrence 1994 vaginal cuff  s/p radiation  . Fecal incontinence   . Urge urinary incontinence   . OAB (overactive bladder)   . Chronic cystitis   . Detrusor instability of bladder   . Glaucoma of both eyes   . History of radiation therapy     1994  lower pelvic area for recurrent endometrial cancer at vaginal cuff  . Arthritis     back  . Fx. left wrist     06-05-2014--  no surgical intervention-- is wearing brace  . Wears glasses     Past Surgical History  Procedure Laterality Date  . Patella fracture surgery Left 1995  . Anal rectal manometry N/A 03/29/2014    Procedure: ANO RECTAL MANOMETRY;  Surgeon: [name], MD;  Location: [facility];  Service: Endoscopy;  Laterality: N/A;  . Anal rectal manometry N/A 04/28/2014    Procedure: ANO RECTAL MANOMETRY;  Surgeon: [name], MD;  Location: [facility];  Service: Endoscopy;  Laterality: N/A;  . Total abdominal hysterectomy w/ bilateral salpingoophorectomy   1990  . Cataract extraction w/ intraocular lens  implant, bilateral  1990's  . Glaucoma surgery Bilateral 2010    laser  . Sacral nerve lead implantation -- stage one interstim  [id number]   dr [name]    fecal incontinence    There were no vitals filed for this visit.  Visit Diagnosis:  Weakness of perineal floor  Pelvic floor dysfunction      Subjective Assessment - 08/01/15 1410    Subjective I still have trouble pushing the bowels out.  I have gotten a stool to place  under my feet but not change. when I get up to walk the bowels start to leak out. the bowels do not come out itself. Moving causes the bowels to drop out.    How long can you walk comfortably? feeling of something is pushing on her bottom and feeling of having a bowel movement.    Patient Stated Goals reduce incontinence   Currently in Pain? No/denies   Multiple Pain Sites No                      Pelvic Floor Special Questions - 08/01/15 0001    Pelvic Floor Internal Exam Patient confirms identification and approves physical therapist to assess muscle strength and integrity   Exam Type Rectal   Palpation puborectalis muscle needs tactile  cues to come forward, no tenderness located in pelvic floor muscles   Strength good squeeze, good lift, able to hold agaisnt strong resistance  for 2 contractions for 3 sec then may push finger out.    Tone low tone.                   PT Education - 08/01/15 1442    Education provided Yes   Education Details pelvic floor contraction in sidely, bridge, hookly with alternate shoulder and hip flexion, reviewed toileting technique   Person(s) Educated Patient   Methods Explanation;Demonstration;Verbal cues;Handout   Comprehension Returned demonstration;Verbalized understanding;Verbal cues required          PT Short Term Goals - 08/01/15 1443    PT SHORT TERM GOAL #1   Title independent with initial HEP   Time 4   Period Weeks   Status  Achieved   PT SHORT TERM GOAL #2   Title understand correct toileting technique to fully empty her bowels   Time 4   Period Weeks   Status Achieved   PT SHORT TERM GOAL #3   Title understand abdominal massage to assist in movement of bowels in the intestines   Time 4   Period Weeks   Status Achieved   PT SHORT TERM GOAL #4   Title understand how to avoid the urge to have a bowel movement using behavioral techniques   Time 4   Period Weeks   Status Achieved           PT Long Term Goals - 07/19/15 2105    PT LONG TERM GOAL #1   Title independent with HEP and how to progress herself   Time 8   Period Weeks   Status New   PT LONG TERM GOAL #2   Title fecal incontinence decreased by 50% due to increased rectal strength >/= 5/5 holding for 10 sec   Time 8   Period Weeks   Status New   PT LONG TERM GOAL #3   Title use 6 or less pads due to reduction of fecal incontinence   Time 8   Period Weeks   Status New   PT LONG TERM GOAL #4   Title ability to relax the pelvic floor so she is able to fully empty her bowels and go less than 6 times per day   Time 8   Period Weeks   Status New   PT LONG TERM GOAL #5   Title urgency to have a bowel movement is intermittent and decreased >/= 50%   Time 8   Period Weeks   Status New               Plan - 08/01/15 1444    Clinical Impression Statement Patient is a 82 year old female with diagnosis of pelvic floor dysfunction and fecal incontinence.  Patient is less anxious today.  Patient pelvic floor contraction annally is inconsistent.  Some times is  a 4/5 and sometimes is a 3/5.  Patient able to contract better if the therapist taps the sphincter muscles.  Patient repquires increased time with instruction.  Patient hsa met STG" 1-3.  Patient would benefit from physical therapy to increase pelvic floor strength so she can vull evacuate her bowels.    Pt will benefit from skilled therapeutic intervention in order to improve on the  following deficits Decreased coordination;Decreased mobility;Increased muscle spasms;Decreased activity tolerance;Decreased strength;Increased fascial restricitons;Decreased endurance   Rehab Potential Good  Clinical Impairments Affecting Rehab Potential History of cancer with radiation outside the ovary; Osteoporosis   PT Frequency 1x / week   PT Duration 8 weeks   PT Treatment/Interventions Biofeedback;Therapeutic exercise;Therapeutic activities;Neuromuscular re-education;Patient/family education;Manual techniques;Dry needling   PT Next Visit Plan pelvic floor contraction in sitting   PT Home Exercise Plan --   Consulted and Agree with Plan of Care Patient        Problem List Patient Active Problem List   Diagnosis Date Noted  . Rectocele 09/16/2013    Earlie Counts, PT 08/01/2015 2:48 PM  [facility] [address], [place], [place], [address] Phone: [phone]   Fax:  [phone]  Name: MARIDEL PIXLER MRN: [id number] Date of Birth: 1929-10-24

## 2015-08-01 NOTE — Patient Instructions (Signed)
Slow Contraction: Gravity Eliminated (Side-Lying)    Lie on left side, hips and knees slightly bent. Slowly squeeze pelvic floor for _5__ seconds. Rest for _5__ seconds. Repeat 5___ times. Do _3__ times a day.   Copyright  VHI. All rights reserved.  Bracing With Bridging (Hook-Lying)    With neutral spine, tighten pelvic floor and abdominals and hold. Lift bottom. Repeat _10__ times. Do _1__ times a day.   Copyright  VHI. All rights reserved.  Bracing With Arms / Legs (Hook-Lying)    With neutral spine, tighten pelvic floor and abdominals and hold. Raise arm and opposite leg, then return. Repeat wtih other limbs. Repeat _10__ times. Do ___ times a day.1   Copyright  VHI. All rights reserved.  [place] [address] [place], [place] [address] Phone # [phone] Fax [phone]

## 2015-08-08 ENCOUNTER — Encounter: Payer: Self-pay | Admitting: Physical Therapy

## 2015-08-08 ENCOUNTER — Ambulatory Visit: Payer: Medicare Other | Attending: Gastroenterology | Admitting: Physical Therapy

## 2015-08-08 DIAGNOSIS — N8189 Other female genital prolapse: Secondary | ICD-10-CM

## 2015-08-08 DIAGNOSIS — N907 Vulvar cyst: Secondary | ICD-10-CM | POA: Insufficient documentation

## 2015-08-08 DIAGNOSIS — N8184 Pelvic muscle wasting: Secondary | ICD-10-CM | POA: Insufficient documentation

## 2015-08-08 DIAGNOSIS — M6289 Other specified disorders of muscle: Secondary | ICD-10-CM

## 2015-08-08 NOTE — Therapy (Signed)
[facility] [address] [place], [place], [address] Phone: [phone]   Fax:  [phone]  Physical Therapy Treatment  Patient Details  Name: Janet Cooper MRN: [id number] Date of Birth: 06/10/1929 Referring Provider: Dr. Lanelle Bal Ransohoff  Encounter Date: 08/08/2015      PT End of Session - 08/08/15 1404    Visit Number 4   Number of Visits 10  Medicare   Date for PT Re-Evaluation 09/13/15   Authorization Type kx modifier 15 visits; g-code   PT Start Time 1400   PT Stop Time 1440   PT Time Calculation (min) 40 min   Activity Tolerance Patient tolerated treatment well   Behavior During Therapy Anxious;WFL for tasks assessed/performed      Past Medical History  Diagnosis Date  . Hypertension   . Osteopenia 05/2013    T score -2.1 FRAX 8.9%/2.5%  . Hyperlipidemia   . History of endometrial cancer     1990 dx  Stage IB, Grade 1 endometrial carcinoma  s/p TAH w/ BSO--  recurrence 1994 vaginal cuff  s/p radiation  . Fecal incontinence   . Urge urinary incontinence   . OAB (overactive bladder)   . Chronic cystitis   . Detrusor instability of bladder   . Glaucoma of both eyes   . History of radiation therapy     1994  lower pelvic area for recurrent endometrial cancer at vaginal cuff  . Arthritis     back  . Fx. left wrist     06-05-2014--  no surgical intervention-- is wearing brace  . Wears glasses     Past Surgical History  Procedure Laterality Date  . Patella fracture surgery Left 1995  . Anal rectal manometry N/A 03/29/2014    Procedure: ANO RECTAL MANOMETRY;  Surgeon: [name], MD;  Location: [facility];  Service: Endoscopy;  Laterality: N/A;  . Anal rectal manometry N/A 04/28/2014    Procedure: ANO RECTAL MANOMETRY;  Surgeon: [name], MD;  Location: [facility];  Service: Endoscopy;  Laterality: N/A;  . Total abdominal hysterectomy w/ bilateral salpingoophorectomy  1990   . Cataract extraction w/ intraocular lens  implant, bilateral  1990's  . Glaucoma surgery Bilateral 2010    laser  . Sacral nerve lead implantation -- stage one interstim  [id number]   dr [name]    fecal incontinence    There were no vitals filed for this visit.  Visit Diagnosis:  Weakness of perineal floor  Pelvic floor dysfunction      Subjective Assessment - 08/08/15 1404    Subjective I have been doing the exercises but no change. When walking has a feeling of somethnig there in the rectal area but no feeling with sitting down.    How long can you walk comfortably? feeling of something is pushing on her bottom and feeling of having a bowel movement.    Patient Stated Goals reduce incontinence   Currently in Pain? No/denies                         [facility] Adult PT Treatment/Exercise - 08/08/15 0001    Self-Care   Self-Care Other Self-Care Comments   Other Self-Care Comments      Lumbar Exercises: Supine   Dead Bug 10 reps  with pelvic floor contraction   Bridge 20 reps  with pelvic floor contraction   Lumbar Exercises: Sidelying   Other Sidelying Lumbar Exercises pelvic floor  contraction hold 5 sec 5times                PT Education - 08/08/15 1434    Education provided Yes   Education Details urge to void   Person(s) Educated Patient   Methods Explanation;Demonstration;Verbal cues;Handout   Comprehension Returned demonstration;Verbalized understanding          PT Short Term Goals - 08/01/15 1443    PT SHORT TERM GOAL #1   Title independent with initial HEP   Time 4   Period Weeks   Status Achieved   PT SHORT TERM GOAL #2   Title understand correct toileting technique to fully empty her bowels   Time 4   Period Weeks   Status Achieved   PT SHORT TERM GOAL #3   Title understand abdominal massage to assist in movement of bowels in the intestines   Time 4   Period Weeks   Status Achieved   PT SHORT TERM GOAL #4   Title  understand how to avoid the urge to have a bowel movement using behavioral techniques   Time 4   Period Weeks   Status Achieved           PT Long Term Goals - 08/08/15 1405    PT LONG TERM GOAL #1   Title independent with HEP and how to progress herself   Time 8   Period Weeks   Status On-going   PT LONG TERM GOAL #2   Title fecal incontinence decreased by 50% due to increased rectal strength >/= 5/5 holding for 10 sec   Time 8   Period Weeks   Status On-going   PT LONG TERM GOAL #3   Title use 6 or less pads due to reduction of fecal incontinence   Time 8   Period Weeks   Status On-going   PT LONG TERM GOAL #4   Title ability to relax the pelvic floor so she is able to fully empty her bowels and go less than 6 times per day   Time 8   Period Weeks   Status On-going   PT LONG TERM GOAL #5   Title urgency to have a bowel movement is intermittent and decreased >/= 50%   Time 8   Period Weeks   Status On-going               Plan - 08/08/15 1435    Clinical Impression Statement Patient is a 82 year old female wiht difficulty coordinating a bowel movement.  Patient goes to the bathroom frequently due to the urge and will hodl her breath causing the bowel to leak out.  Patient has learned how to control the urge and will practice at home so she is only hoving a bowel movement 3 times per day. Patient would benefit from phsyical therapy to ncreas strength.    Pt will benefit from skilled therapeutic intervention in order to improve on the following deficits Decreased coordination;Decreased mobility;Increased muscle spasms;Decreased activity tolerance;Decreased strength;Increased fascial restricitons;Decreased endurance   Rehab Potential Good   Clinical Impairments Affecting Rehab Potential History of cancer with radiation outside the ovary; Osteoporosis   PT Frequency 1x / week   PT Duration 8 weeks   PT Treatment/Interventions Biofeedback;Therapeutic  exercise;Therapeutic activities;Neuromuscular re-education;Patient/family education;Manual techniques;Dry needling   PT Next Visit Plan pelvic floor EMG   PT Home Exercise Plan progress as needed   Consulted and Agree with Plan of Care Patient  Problem List Patient Active Problem List   Diagnosis Date Noted  . Rectocele 09/16/2013    Earlie Counts, PT 08/08/2015 2:46 PM   [facility] [address] [place], [place], [address] Phone: [phone]   Fax:  [phone]  Name: Janet Cooper MRN: [id number] Date of Birth: Feb 09, 1930

## 2015-08-08 NOTE — Patient Instructions (Addendum)
Relaxation Exercises with the Urge to Void   When you experience an urge to void:  FIRST  Stop and sit very still    Sit down if you can    Don't move    You need to stay very still to maintain control  SECOND Squeeze your anal pelvic floor muscles 5 times, like a quick flick, to keep from leaking bowels  THIRD Relax  Take a deep breath and then let it out  Try to make the urge go away by using relaxation and visualization techniques or read a book.   FINALLY When you feel the urge go away somewhat, walk normally to the bathroom.   If the urge gets suddenly stronger on the way, you may stop again and relax to regain control.  relaxation technique is to think about something you enjoy, think about the beach, tighten all of the body muscles for 5 seconds then relax for 5 times  visualization technique is imagining yourself at the beach, imagining your anus to tighten and lift upward.  [place] [address], [place], [place] [address] Phone # [phone] Fax [phone]

## 2015-08-17 ENCOUNTER — Encounter: Payer: Self-pay | Admitting: Physical Therapy

## 2015-08-17 ENCOUNTER — Ambulatory Visit: Payer: Medicare Other | Admitting: Physical Therapy

## 2015-08-17 DIAGNOSIS — N907 Vulvar cyst: Secondary | ICD-10-CM | POA: Diagnosis not present

## 2015-08-17 DIAGNOSIS — N8189 Other female genital prolapse: Secondary | ICD-10-CM

## 2015-08-17 DIAGNOSIS — M6289 Other specified disorders of muscle: Secondary | ICD-10-CM

## 2015-08-17 NOTE — Patient Instructions (Signed)
Sit to Stand: Phase 3    Sitting, squeeze pelvic floor and hold. Lean trunk forward. Push up on arms and stand up. Relax. Repeat ___ times. Do ___ times a day.  Copyright  VHI. All rights reserved.  Sit to Stand: Phase 3   Do when you have the urge to have a bowel movement and walk to bathroom. Sitting, squeeze pelvic floor and hold.  Stand up and place finger in anal region to assist in anal wink(pelvic floor contraction) When you get to commode relax pelvic floor. Do every time you have the urge to go to the bathroom.  Copyright  VHI. All rights reserved.   Walk with heel toe and big steps while taking walk in the driveway.   All the time take big steps instead of small steps. [facility] [address], [place], [place] [address] Phone # [phone] Fax [phone]

## 2015-08-17 NOTE — Therapy (Signed)
[facility] [address], [place], [place], [address] Phone: [phone]   Fax:  [phone]  Physical Therapy Treatment  Patient Details  Name: [facility] MRN: [id number] Date of Birth: Feb 17, 1930 Referring Provider: Dr. Lanelle Bal Ransohoff  Encounter Date: 08/17/2015      PT End of Session - 08/17/15 1451    Visit Number 5   Number of Visits 10  Medicare   Date for PT Re-Evaluation 09/13/15   Authorization Type kx modifier 15 visits; g-code   PT Start Time 1450   PT Stop Time 1528   PT Time Calculation (min) 38 min   Activity Tolerance Patient tolerated treatment well   Behavior During Therapy Anxious;WFL for tasks assessed/performed      Past Medical History  Diagnosis Date  . Hypertension   . Osteopenia 05/2013    T score -2.1 FRAX 8.9%/2.5%  . Hyperlipidemia   . History of endometrial cancer     1990 dx  Stage IB, Grade 1 endometrial carcinoma  s/p TAH w/ BSO--  recurrence 1994 vaginal cuff  s/p radiation  . Fecal incontinence   . Urge urinary incontinence   . OAB (overactive bladder)   . Chronic cystitis   . Detrusor instability of bladder   . Glaucoma of both eyes   . History of radiation therapy     1994  lower pelvic area for recurrent endometrial cancer at vaginal cuff  . Arthritis     back  . Fx. left wrist     06-05-2014--  no surgical intervention-- is wearing brace  . Wears glasses     Past Surgical History  Procedure Laterality Date  . Patella fracture surgery Left 1995  . Anal rectal manometry N/A 03/29/2014    Procedure: ANO RECTAL MANOMETRY;  Surgeon: [name], MD;  Location: [facility];  Service: Endoscopy;  Laterality: N/A;  . Anal rectal manometry N/A 04/28/2014    Procedure: ANO RECTAL MANOMETRY;  Surgeon: [name], MD;  Location: [facility];  Service: Endoscopy;  Laterality: N/A;  . Total abdominal hysterectomy w/ bilateral salpingoophorectomy   1990  . Cataract extraction w/ intraocular lens  implant, bilateral  1990's  . Glaucoma surgery Bilateral 2010    laser  . Sacral nerve lead implantation -- stage one interstim  [id number]   dr [name]    fecal incontinence    There were no vitals filed for this visit.  Visit Diagnosis:  Weakness of perineal floor  Pelvic floor dysfunction      Subjective Assessment - 08/17/15 1452    Subjective I am practicing.  I do not see any difference.  I can not empty my bowels. I am trying to breath. I am afraid to eat when I am going out. I can sleep for 5 hours. When stands up to go to the bathroom bowel movement will come out. Patient reports constant urge to have a bowel movement.    How long can you walk comfortably? feeling of something is pushing on her bottom and feeling of having a bowel movement.    Patient Stated Goals reduce incontinence   Currently in Pain? No/denies                         [facility] Adult PT Treatment/Exercise - 08/17/15 0001    Ambulation/Gait   Ambulation Distance (Feet) 60 Feet   Gait Pattern Step-through pattern  big steps and heel toe to  decrease falls   Ambulation Surface Level   Gait Comments Patient takes small steps and lands on her toes   Self-Care   Self-Care Other Self-Care Comments   Other Self-Care Comments  ways to prevent urge to have a bowel movement   Lumbar Exercises: Standing   Other Standing Lumbar Exercises sit to stand and place finger on anal region to give tactile cue to contract anus to prevent fecal leakage x 15 min.                 PT Education - 08/17/15 1524    Education provided Yes   Education Details how to contract pelvic floor while standing from chair to have a bowel movement, correct walking   Person(s) Educated Patient   Methods Explanation;Demonstration;Verbal cues;Handout   Comprehension Returned demonstration;Verbalized understanding          PT Short Term Goals - 08/01/15 1443     PT SHORT TERM GOAL #1   Title independent with initial HEP   Time 4   Period Weeks   Status Achieved   PT SHORT TERM GOAL #2   Title understand correct toileting technique to fully empty her bowels   Time 4   Period Weeks   Status Achieved   PT SHORT TERM GOAL #3   Title understand abdominal massage to assist in movement of bowels in the intestines   Time 4   Period Weeks   Status Achieved   PT SHORT TERM GOAL #4   Title understand how to avoid the urge to have a bowel movement using behavioral techniques   Time 4   Period Weeks   Status Achieved           PT Long Term Goals - 08/17/15 1527    PT LONG TERM GOAL #1   Title independent with HEP and how to progress herself   Time 8   Period Weeks   Status On-going  still learning   PT LONG TERM GOAL #2   Title fecal incontinence decreased by 50% due to increased rectal strength >/= 5/5 holding for 10 sec   Time 8   Period Weeks   Status On-going  no change from last week   PT LONG TERM GOAL #4   Title ability to relax the pelvic floor so she is able to fully empty her bowels and go less than 6 times per day   Time 8   Period Weeks   Status On-going  goes 12 times per day   PT LONG TERM GOAL #5   Title urgency to have a bowel movement is intermittent and decreased >/= 50%   Time 8   Period Weeks   Status On-going  constant               Plan - 08/17/15 1528    Clinical Impression Statement Patient is a 82 year old female with diagnosis of pelvic floor dysfunction.  Patient has difficulty with emptying her bowels and has a constant urge to have a bowel movement.  Patient will have fecal leakage on the way to the commode.  Patient takes small steps making her at risk of falls due to her nervous about a fecal leakage. Patient would benefit from physical therapy to increase pelvic floor strenght.    Pt will benefit from skilled therapeutic intervention in order to improve on the following deficits Decreased  coordination;Decreased mobility;Increased muscle spasms;Decreased activity tolerance;Decreased strength;Increased fascial restricitons;Decreased endurance   Rehab Potential Good  Clinical Impairments Affecting Rehab Potential History of cancer with radiation outside the ovary; Osteoporosis   PT Frequency 1x / week   PT Duration 8 weeks   PT Treatment/Interventions Biofeedback;Therapeutic exercise;Therapeutic activities;Neuromuscular re-education;Patient/family education;Manual techniques;Dry needling   PT Next Visit Plan pelvic floor strenght   PT Home Exercise Plan progress as needed   Consulted and Agree with Plan of Care Patient        Problem List Patient Active Problem List   Diagnosis Date Noted  . Rectocele 09/16/2013    Earlie Counts, PT 08/17/2015 3:31 PM    [facility] [address] [place], [place], [address] Phone: [phone]   Fax:  [phone]  Name: ARTHURINE AMBROSIO MRN: [id number] Date of Birth: Mar 07, 1930

## 2015-08-22 ENCOUNTER — Ambulatory Visit: Payer: Medicare Other | Admitting: Physical Therapy

## 2015-08-22 ENCOUNTER — Encounter: Payer: Self-pay | Admitting: Physical Therapy

## 2015-08-22 DIAGNOSIS — M6289 Other specified disorders of muscle: Secondary | ICD-10-CM

## 2015-08-22 DIAGNOSIS — N907 Vulvar cyst: Secondary | ICD-10-CM | POA: Diagnosis not present

## 2015-08-22 DIAGNOSIS — N8189 Other female genital prolapse: Secondary | ICD-10-CM

## 2015-08-22 NOTE — Therapy (Signed)
[facility] [address] [place], [place], [address] Phone: [phone]   Fax:  [phone]  Physical Therapy Treatment  Patient Details  Name: [facility] MRN: [id number] Date of Birth: [birth year] Referring Provider: Dr. Lanelle Bal Ransohoff  Encounter Date: 08/22/2015      PT End of Session - 08/22/15 1440    Visit Number 6   Number of Visits 10  Medicare   Date for PT Re-Evaluation 09/13/15   Authorization Type kx modifier 15 visits; g-code   PT Start Time 1400   PT Stop Time 1445   PT Time Calculation (min) 45 min   Activity Tolerance Patient tolerated treatment well   Behavior During Therapy Anxious;WFL for tasks assessed/performed      Past Medical History  Diagnosis Date  . Hypertension   . Osteopenia 05/2013    T score -2.1 FRAX 8.9%/2.5%  . Hyperlipidemia   . History of endometrial cancer     1990 dx  Stage IB, Grade 1 endometrial carcinoma  s/p TAH w/ BSO--  recurrence 1994 vaginal cuff  s/p radiation  . Fecal incontinence   . Urge urinary incontinence   . OAB (overactive bladder)   . Chronic cystitis   . Detrusor instability of bladder   . Glaucoma of both eyes   . History of radiation therapy     1994  lower pelvic area for recurrent endometrial cancer at vaginal cuff  . Arthritis     back  . Fx. left wrist     06-05-2014--  no surgical intervention-- is wearing brace  . Wears glasses     Past Surgical History  Procedure Laterality Date  . Patella fracture surgery Left 1995  . Anal rectal manometry N/A 03/29/2014    Procedure: ANO RECTAL MANOMETRY;  Surgeon: [name], MD;  Location: [facility];  Service: Endoscopy;  Laterality: N/A;  . Anal rectal manometry N/A 04/28/2014    Procedure: ANO RECTAL MANOMETRY;  Surgeon: [name], MD;  Location: [facility];  Service: Endoscopy;  Laterality: N/A;  . Total abdominal hysterectomy w/ bilateral salpingoophorectomy   1990  . Cataract extraction w/ intraocular lens  implant, bilateral  1990's  . Glaucoma surgery Bilateral 2010    laser  . Sacral nerve lead implantation -- stage one interstim  [id number]   dr [name]    fecal incontinence    There were no vitals filed for this visit.  Visit Diagnosis:  Weakness of perineal floor  Pelvic floor dysfunction      Subjective Assessment - 08/22/15 1405    Subjective I have been practicing my walking. I went to the bathroom 1 time today and was very small.  I went very often yesterday. I am afraid to eat due to it will comeout while I am going out.    How long can you walk comfortably? feeling of something is pushing on her bottom and feeling of having a bowel movement.    Patient Stated Goals reduce incontinence   Currently in Pain? No/denies                         [facility] Adult PT Treatment/Exercise - 08/22/15 0001    Ambulation/Gait   Ambulation Distance (Feet) 20 Feet  8 times   Gait Pattern Step-through pattern  big steps and heel toe to decrease falls   Lumbar Exercises: Aerobic   Elliptical level 1 1 min   Lumbar Exercises:  Machines for [employer]. #6 50# 30 x   Other Lumbar Machine Exercise nustep level 1 5 min   Lumbar Exercises: Standing   Other Standing Lumbar Exercises stand on step hip abduction with control and abdominal bracing 20x bil.                 PT Education - 08/22/15 1440    Education provided Yes   Education Details bowel health, hip exercises, balance exercises   Person(s) Educated Patient   Methods Explanation;Demonstration;Verbal cues;Handout   Comprehension Returned demonstration;Verbalized understanding          PT Short Term Goals - 08/01/15 1443    PT SHORT TERM GOAL #1   Title independent with initial HEP   Time 4   Period Weeks   Status Achieved   PT SHORT TERM GOAL #2   Title understand correct toileting technique to fully empty her bowels   Time 4    Period Weeks   Status Achieved   PT SHORT TERM GOAL #3   Title understand abdominal massage to assist in movement of bowels in the intestines   Time 4   Period Weeks   Status Achieved   PT SHORT TERM GOAL #4   Title understand how to avoid the urge to have a bowel movement using behavioral techniques   Time 4   Period Weeks   Status Achieved           PT Long Term Goals - 08/22/15 1446    PT LONG TERM GOAL #1   Title independent with HEP and how to progress herself   Time 8   Period Weeks   Status On-going   PT LONG TERM GOAL #2   Title fecal incontinence decreased by 50% due to increased rectal strength >/= 5/5 holding for 10 sec   Time 8   Period Weeks   Status On-going   PT LONG TERM GOAL #3   Title use 6 or less pads due to reduction of fecal incontinence   Time 8   Period Weeks   Status On-going  5 pads   PT LONG TERM GOAL #4   Title ability to relax the pelvic floor so she is able to fully empty her bowels and go less than 6 times per day   Time 8   Period Weeks   Status On-going   PT LONG TERM GOAL #5   Title urgency to have a bowel movement is intermittent and decreased >/= 50%   Time 8   Period Weeks   Status On-going               Plan - 08/22/15 1442    Clinical Impression Statement Patient is a 82 year old female with diagnosis of pelvic lfoor dysfunction.  Patient was able to exercise in therapy without fecal leakage for first time.  Patient has learned to eat at same time of day and then go to commocde to get bowels in habit.  Patient is taking bigger steps to reduce her risk of falls. Patient will benefit from physical therapy to improve strength and decrease fecal leakage.    Pt will benefit from skilled therapeutic intervention in order to improve on the following deficits Decreased coordination;Decreased mobility;Increased muscle spasms;Decreased activity tolerance;Decreased strength;Increased fascial restricitons;Decreased endurance    Clinical Impairments Affecting Rehab Potential History of cancer with radiation outside the ovary; Osteoporosis   PT Frequency 1x / week   PT Duration 8  weeks   PT Treatment/Interventions Biofeedback;Therapeutic exercise;Therapeutic activities;Neuromuscular re-education;Patient/family education;Manual techniques;Dry needling   PT Next Visit Plan continue working on strength for pelvic floor and hips   PT Home Exercise Plan progress as needed        Problem List Patient Active Problem List   Diagnosis Date Noted  . Rectocele 09/16/2013    Earlie Counts, PT 08/22/2015 2:49 PM   [facility] [address], [place], [place], [address] Phone: [phone]   Fax:  [phone]  Name: [facility] MRN: [id number] Date of Birth: [birth year]

## 2015-08-22 NOTE — Patient Instructions (Addendum)
Introduction to Bowel Health Diet and daily habits can help you predict when your bowels will move on a regular basis.  The consistency and quantity of the stool is usually more important than the frequency.  The goal is to have a regular bowel movement that is soft but formed.   Tips on Emptying Regularly . Eat breakfast.  Usually the best time of day for a bowel movement will be a half hour to an hour after eating.  These times are best because the body uses the gastrocolic reflex, a stimulation of bowel motion that occurs with eating, to help produce a bowel movement.  For some people even a simple hot drink in the morning can help the reflex action begin. . Eat all your meals at a predictable time each day.  The bowel functions best when food is introduced at the same regular intervals. . The amount of food eaten at a given time of day should be about the same size from day to day.  The bowel functions best when food is introduced in similar quantities from day to day. It is fine to have a small breakfast and a large lunch, or vice versa, just be consistent. . Eat two servings of fruit or vegetables and at least one serving of a complex carbohydrates (whole grains such as brown rice, bran, whole wheat bread, or oatmeal) at each meal. . Drink plenty of water-ideally eight glasses a day.  Be sure to increase your water intake if you are increasing fiber into your diet.  Maintain Healthy Habits . Exercise daily.  You may exercise at any time of day, but you may find that bowel function is helped most if the exercise is at a consistent time each day. . Make sure that you are not rushed and have convenient access to a bathroom at your selected time to empty your bowels.    Tandem Stance    Right foot in front of left, heel touching toe both feet "straight ahead". Tighten pelvic floor muscles . Balance in this position __30sec 3 times each way 3 times per week. Do at counter incase lose balance.    ABDUCTION: Standing (Active)    Stand, feet flat. Lift right leg out to side. Stand on step.  Tighten pelvic floor muscles.  Complete _1__ sets of _30__ repetitions. Perform  3 times per week. Do on both legs.   http://gtsc.exer.us/111   Copyright  VHI. All rights reserved.   When going up and down steps tighten pelvic floor and do not let knees cross. Make a space between legs.  [facility] [address] [place], [place] [address] Phone # [phone] Fax [phone] .

## 2015-08-29 ENCOUNTER — Ambulatory Visit: Payer: Medicare Other | Admitting: Physical Therapy

## 2015-08-29 ENCOUNTER — Encounter: Payer: Self-pay | Admitting: Physical Therapy

## 2015-08-29 DIAGNOSIS — N907 Vulvar cyst: Secondary | ICD-10-CM | POA: Diagnosis not present

## 2015-08-29 DIAGNOSIS — N8189 Other female genital prolapse: Secondary | ICD-10-CM

## 2015-08-29 DIAGNOSIS — M6289 Other specified disorders of muscle: Secondary | ICD-10-CM

## 2015-08-29 NOTE — Therapy (Signed)
[facility] [address], [place], [place], [address] Phone: [phone]   Fax:  [phone]  Physical Therapy Treatment  Patient Details  Name: Janet Cooper MRN: [id number] Date of Birth: 07/04/1929 Referring Provider: Dr. Lanelle Bal Ransohoff  Encounter Date: 08/29/2015      PT End of Session - 08/29/15 1404    Visit Number 7   Number of Visits 10  Medicare   Date for PT Re-Evaluation 09/13/15   Authorization Type kx modifier 15 visits; g-code   PT Start Time 1400   PT Stop Time 1440   PT Time Calculation (min) 40 min   Activity Tolerance Patient tolerated treatment well   Behavior During Therapy Anxious;WFL for tasks assessed/performed      Past Medical History  Diagnosis Date  . Hypertension   . Osteopenia 05/2013    T score -2.1 FRAX 8.9%/2.5%  . Hyperlipidemia   . History of endometrial cancer     1990 dx  Stage IB, Grade 1 endometrial carcinoma  s/p TAH w/ BSO--  recurrence 1994 vaginal cuff  s/p radiation  . Fecal incontinence   . Urge urinary incontinence   . OAB (overactive bladder)   . Chronic cystitis   . Detrusor instability of bladder   . Glaucoma of both eyes   . History of radiation therapy     1994  lower pelvic area for recurrent endometrial cancer at vaginal cuff  . Arthritis     back  . Fx. left wrist     06-05-2014--  no surgical intervention-- is wearing brace  . Wears glasses     Past Surgical History  Procedure Laterality Date  . Patella fracture surgery Left 1995  . Anal rectal manometry N/A 03/29/2014    Procedure: ANO RECTAL MANOMETRY;  Surgeon: [name], MD;  Location: [facility];  Service: Endoscopy;  Laterality: N/A;  . Anal rectal manometry N/A 04/28/2014    Procedure: ANO RECTAL MANOMETRY;  Surgeon: [name], MD;  Location: [facility];  Service: Endoscopy;  Laterality: N/A;  . Total abdominal hysterectomy w/ bilateral salpingoophorectomy   1990  . Cataract extraction w/ intraocular lens  implant, bilateral  1990's  . Glaucoma surgery Bilateral 2010    laser  . Sacral nerve lead implantation -- stage one interstim  [id number]   dr [name]    fecal incontinence    There were no vitals filed for this visit.  Visit Diagnosis:  Weakness of perineal floor  Pelvic floor dysfunction      Subjective Assessment - 08/29/15 1401    Subjective I tried to do my exercies and am tired and using alot of energy. Bowels come out lttle by little. Feel like has to go and nothing there. I do not feel the bowel movement coming out into my pants.    How long can you walk comfortably? feeling of something is pushing on her bottom and feeling of having a bowel movement.    Patient Stated Goals reduce incontinence   Currently in Pain? No/denies            [facility] PT Assessment - 08/29/15 0001    Assessment   Medical Diagnosis N81.84 Pelvic floor Dysfunction; Fecal incontinence R15.9   Referring Provider Dr. Lanelle Bal Ransohoff   Onset Date/Surgical Date 06/04/92   Precautions   Precautions Other (comment)   Precaution Comments cancer precaution due to radiation treatment in pelvis; Patient has a sacral nerve stimulator implantation that can  be turned offf   Balance Screen   Has the patient fallen in the past 6 months No   Has the patient had a decrease in activity level because of a fear of falling?  No   Is the patient reluctant to leave their home because of a fear of falling?  No   Prior Function   Level of Independence Independent   Vocation Retired   Charity fundraiser Status Within Functional Limits for tasks assessed   Observation/Other Assessments   Observations Patient is anxious   Focus on Therapeutic Outcomes (FOTO)  100% limitaiton   AROM   Overall AROM Comments lumbar ROM is full   Strength   Overall Strength Comments bil. hip flexion and extension 4/5                  Pelvic Floor  Special Questions - 08/29/15 0001    Biofeedback rest [id number] uv, 3 quick flicks max 0000000 and avg. 45.81 uv; 10 sec contraction 48.70 uv; 20 second contraction 43.69 uv; rest ; worked in sitting and standing  sidely   Biofeedback sensor type Surface  surface   Biofeedback Activity Quick contraction;10 second hold  assessment                    PT Education - 08/29/15 1708    Education provided No          PT Short Term Goals - 08/01/15 1443    PT SHORT TERM GOAL #1   Title independent with initial HEP   Time 4   Period Weeks   Status Achieved   PT SHORT TERM GOAL #2   Title understand correct toileting technique to fully empty her bowels   Time 4   Period Weeks   Status Achieved   PT SHORT TERM GOAL #3   Title understand abdominal massage to assist in movement of bowels in the intestines   Time 4   Period Weeks   Status Achieved   PT SHORT TERM GOAL #4   Title understand how to avoid the urge to have a bowel movement using behavioral techniques   Time 4   Period Weeks   Status Achieved           PT Long Term Goals - 08/29/15 1410    PT LONG TERM GOAL #1   Title independent with HEP and how to progress herself   Time 8   Period Weeks   Status On-going   PT LONG TERM GOAL #2   Title fecal incontinence decreased by 50% due to increased rectal strength >/= 5/5 holding for 10 sec   Time 8   Period Weeks   Status On-going   PT LONG TERM GOAL #3   Title use 6 or less pads due to reduction of fecal incontinence   Time 8   Period Weeks   Status On-going   PT LONG TERM GOAL #4   Title ability to relax the pelvic floor so she is able to fully empty her bowels and go less than 6 times per day   Time 8   Period Weeks   Status On-going   PT LONG TERM GOAL #5   Title urgency to have a bowel movement is intermittent and decreased >/= 50%   Time 8   Period Weeks   Status On-going               Plan - 08/29/15 1708  Clinical Impression  Statement Patient is a 82 year old female with diagnosis of pelvic floor dysfunction. Patient will go to the bathroom 10-15 times per day.  In the AM it is worse.  Patient reports she has frequent small bowel movements and once in awhile they are large. Patient reports she does not feel herself leaking bowels while shopping. Patient is consistent with her exericses but it fatiques her.  Patient is able to contract the pelvic floor while on pelvic floor EMG but needs verbal cues to  breath.  Patient pelvic floor will fatique after 5 seconds.  Patient will benefit from physical therpay to assist her in exercising th epelvic floor correctly.    Pt will benefit from skilled therapeutic intervention in order to improve on the following deficits Decreased coordination;Decreased mobility;Increased muscle spasms;Decreased activity tolerance;Decreased strength;Increased fascial restricitons;Decreased endurance   Clinical Impairments Affecting Rehab Potential History of cancer with radiation outside the ovary; Osteoporosis   PT Frequency 1x / week   PT Duration 8 weeks   PT Treatment/Interventions Biofeedback;Therapeutic exercise;Therapeutic activities;Neuromuscular re-education;Patient/family education;Manual techniques;Dry needling   PT Next Visit Plan continue working on strength for pelvic floor and hips using EMG   PT Home Exercise Plan progress as needed   Consulted and Agree with Plan of Care Patient        Problem List Patient Active Problem List   Diagnosis Date Noted  . Rectocele 09/16/2013    Earlie Counts, PT 08/29/2015 5:13 PM   [facility] [address] [place], [place], [address] Phone: [phone]   Fax:  [phone]  Name: Janet Cooper MRN: [id number] Date of Birth: 1930/05/27

## 2015-09-05 ENCOUNTER — Encounter: Payer: Self-pay | Admitting: Physical Therapy

## 2015-09-05 ENCOUNTER — Ambulatory Visit: Payer: Medicare Other | Attending: Gastroenterology | Admitting: Physical Therapy

## 2015-09-05 DIAGNOSIS — N8189 Other female genital prolapse: Secondary | ICD-10-CM

## 2015-09-05 DIAGNOSIS — N8184 Pelvic muscle wasting: Secondary | ICD-10-CM | POA: Diagnosis present

## 2015-09-05 DIAGNOSIS — N907 Vulvar cyst: Secondary | ICD-10-CM | POA: Diagnosis present

## 2015-09-05 DIAGNOSIS — M6289 Other specified disorders of muscle: Secondary | ICD-10-CM

## 2015-09-05 NOTE — Therapy (Signed)
[facility] [address], [place], [place], [address] Phone: [phone]   Fax:  [phone]  Physical Therapy Treatment  Patient Details  Name: Janet Cooper MRN: [id number] Date of Birth: Nov 14, 1929 Referring Provider: Dr. Lanelle Bal Ransohoff  Encounter Date: 09/05/2015      PT End of Session - 09/05/15 1431    Visit Number 8   Date for PT Re-Evaluation 09/13/15   PT Start Time 1400   PT Stop Time 1438   PT Time Calculation (min) 38 min   Activity Tolerance Patient tolerated treatment well   Behavior During Therapy Anxious;WFL for tasks assessed/performed      Past Medical History  Diagnosis Date  . Hypertension   . Osteopenia 05/2013    T score -2.1 FRAX 8.9%/2.5%  . Hyperlipidemia   . History of endometrial cancer     1990 dx  Stage IB, Grade 1 endometrial carcinoma  s/p TAH w/ BSO--  recurrence 1994 vaginal cuff  s/p radiation  . Fecal incontinence   . Urge urinary incontinence   . OAB (overactive bladder)   . Chronic cystitis   . Detrusor instability of bladder   . Glaucoma of both eyes   . History of radiation therapy     1994  lower pelvic area for recurrent endometrial cancer at vaginal cuff  . Arthritis     back  . Fx. left wrist     06-05-2014--  no surgical intervention-- is wearing brace  . Wears glasses     Past Surgical History  Procedure Laterality Date  . Patella fracture surgery Left 1995  . Anal rectal manometry N/A 03/29/2014    Procedure: ANO RECTAL MANOMETRY;  Surgeon: [name], MD;  Location: [facility];  Service: Endoscopy;  Laterality: N/A;  . Anal rectal manometry N/A 04/28/2014    Procedure: ANO RECTAL MANOMETRY;  Surgeon: [name], MD;  Location: [facility];  Service: Endoscopy;  Laterality: N/A;  . Total abdominal hysterectomy w/ bilateral salpingoophorectomy  1990  . Cataract extraction w/ intraocular lens  implant, bilateral  1990's  . Glaucoma  surgery Bilateral 2010    laser  . Sacral nerve lead implantation -- stage one interstim  [phone]   dr [name]    fecal incontinence    There were no vitals filed for this visit.  Visit Diagnosis:  Weakness of perineal floor  Pelvic floor dysfunction      Subjective Assessment - 09/05/15 1402    Subjective I went to [facility] Friday.  MD said I was a complicated case. He wants me to see Dr. Marcelyn Ditty.    How long can you walk comfortably? feeling of something is pushing on her bottom and feeling of having a bowel movement.    Patient Stated Goals reduce incontinence   Currently in Pain? No/denies            [facility] PT Assessment - 09/05/15 0001    Assessment   Medical Diagnosis N81.84 Pelvic floor Dysfunction; Fecal incontinence R15.9   Onset Date/Surgical Date 06/04/92   Precautions   Precautions Other (comment)   Precaution Comments cancer precaution due to radiation treatment in pelvis; Patient has a sacral nerve stimulator implantation that can be turned offf   Balance Screen   Has the patient fallen in the past 6 months No   Has the patient had a decrease in activity level because of a fear of falling?  No   Is the patient  reluctant to leave their home because of a fear of falling?  No   Home Ecologist residence   Prior Function   Level of Independence Independent   Vocation Retired   Associate Professor   Overall Cognitive Status Within Functional Limits for tasks assessed   Observation/Other Assessments   Observations Patient is anxious   Focus on Therapeutic Outcomes (FOTO)  92%   AROM   Overall AROM Comments lumbar ROM is full   Strength   Overall Strength Comments right hip flexion 4/5, left hip flexion 5/5                  Pelvic Floor Special Questions - 09/05/15 0001    Urinary Leakage Yes   Pad use 12   Activities that cause leaking With strong urge   Urinary urgency Yes   Urinary frequency every 5  min   Fecal incontinence Yes   Fluid intake 3 glasses   Caffeine beverages 4 cups           [facility] Adult PT Treatment/Exercise - 09/05/15 0001    Lumbar Exercises: Standing   Other Standing Lumbar Exercises tandem stance hold 30 sec 2 times each way   Other Standing Lumbar Exercises standing hip abduction with pelvic floor contraction 10x bil.    Lumbar Exercises: Seated   Sit to Stand 10 reps  with pelvic floor contraction   Lumbar Exercises: Supine   Dead Bug 10 reps  with pelvic floor contraction   Bridge 10 reps  with pelvic floor contraction   Lumbar Exercises: Sidelying   Clam 10 reps  bil. with pelvic floor contraction                PT Education - 09/05/15 1430    Education provided Yes   Education Details reviewed HEP and patient is independent   Person(s) Educated Patient   Methods Explanation   Comprehension Verbalized understanding;Returned demonstration          PT Short Term Goals - 09/05/15 1427    PT SHORT TERM GOAL #1   Title independent with initial HEP   Time 4   Status Achieved   PT SHORT TERM GOAL #2   Title understand correct toileting technique to fully empty her bowels   Time 4   Period Weeks   Status Achieved   PT SHORT TERM GOAL #3   Title understand abdominal massage to assist in movement of bowels in the intestines   Time 4   Period Weeks   Status Achieved   PT SHORT TERM GOAL #4   Title understand how to avoid the urge to have a bowel movement using behavioral techniques   Time 4   Period Weeks   Status Achieved           PT Long Term Goals - 09/05/15 1410    PT LONG TERM GOAL #1   Title independent with HEP and how to progress herself   Time 8   Period Weeks   Status Achieved   PT LONG TERM GOAL #2   Title fecal incontinence decreased by 50% due to increased rectal strength >/= 5/5 holding for 10 sec   Time 8   Period Weeks   Status Not Met   PT LONG TERM GOAL #3   Title use 6 or less pads due to  reduction of fecal incontinence   Time 8   Period Weeks   Status Not Met  PT LONG TERM GOAL #4   Title ability to relax the pelvic floor so she is able to fully empty her bowels and go less than 6 times per day   Time 8   Period Weeks   Status Not Met   PT LONG TERM GOAL #5   Title urgency to have a bowel movement is intermittent and decreased >/= 50%   Time 8   Period Weeks   Status Not Met               Plan - 09/06/15 1431    Clinical Impression Statement Patient is a 82 year old female with diagnosis pelvic floor dysfunction.  Patient has not made significant changes. Pateint still wears 12 pads per day.  Patient has to go to the bathroom frequently for bowel movement.  Patient saw a GI physician on Friday and he is referreing her to Dr. Marcelyn Ditty.  Patient has not met her LTG due to not being able to control her feces.  Patient will  be discharged.    Pt will benefit from skilled therapeutic intervention in order to improve on the following deficits Decreased coordination;Decreased mobility;Increased muscle spasms;Decreased activity tolerance;Decreased strength;Increased fascial restricitons;Decreased endurance   Rehab Potential Good   Clinical Impairments Affecting Rehab Potential History of cancer with radiation outside the ovary; Osteoporosis   PT Next Visit Plan Discharge to HEP   PT Home Exercise Plan Current HEP   Consulted and Agree with Plan of Care Patient          G-Codes - 09-06-15 1441    Functional Assessment Tool Used FOTO score is 92% limitation   Functional Limitation Other PT primary   Other PT Primary Goal Status (I7129) At least 80 percent but less than 100 percent impaired, limited or restricted   Other PT Primary Discharge Status (W9090) At least 80 percent but less than 100 percent impaired, limited or restricted      Problem List Patient Active Problem List   Diagnosis Date Noted  . Rectocele 09/16/2013    Earlie Counts,  PT 09-06-2015 2:42 PM   [facility] [address], [place], [place], [address] Phone: [phone]   Fax:  [phone]  Name: Janet Cooper MRN: [id number] Date of Birth: 12-Dec-1929 PHYSICAL THERAPY DISCHARGE SUMMARY  Visits from Start of Care: 7  Current functional level related to goals / functional outcomes: See above.  Has not met goals due to no change in fecal incontinence.    Remaining deficits: See above   Education / Equipment: HEP  Plan: Patient agrees to discharge.  Patient goals were not met. Patient is being discharged due to lack of progress. Thank you for the referral. Earlie Counts, PT 06-Sep-2015 2:43 PM    ?????

## 2015-09-06 ENCOUNTER — Other Ambulatory Visit: Payer: Self-pay | Admitting: General Surgery

## 2015-09-09 ENCOUNTER — Other Ambulatory Visit: Payer: Self-pay

## 2015-09-12 ENCOUNTER — Ambulatory Visit: Payer: Self-pay | Admitting: Physical Therapy

## 2015-09-27 ENCOUNTER — Encounter (HOSPITAL_BASED_OUTPATIENT_CLINIC_OR_DEPARTMENT_OTHER): Payer: Self-pay | Admitting: *Deleted

## 2015-09-27 NOTE — Progress Notes (Signed)
NPO AFTER MN.  ARRIVE AT 0730.  NEEDS ISTAT AND EKG.  WILL TAKE AZOR AM DOS W/ SIPS OF WATER .

## 2015-10-06 ENCOUNTER — Ambulatory Visit (HOSPITAL_BASED_OUTPATIENT_CLINIC_OR_DEPARTMENT_OTHER): Payer: Medicare Other | Admitting: Anesthesiology

## 2015-10-06 ENCOUNTER — Encounter (HOSPITAL_BASED_OUTPATIENT_CLINIC_OR_DEPARTMENT_OTHER)
Admission: RE | Disposition: A | Discharge status: Home / Self Care | Payer: Self-pay | Source: Ambulatory Visit | Attending: General Surgery

## 2015-10-06 ENCOUNTER — Encounter (HOSPITAL_BASED_OUTPATIENT_CLINIC_OR_DEPARTMENT_OTHER): Payer: Self-pay | Admitting: *Deleted

## 2015-10-06 ENCOUNTER — Ambulatory Visit (HOSPITAL_BASED_OUTPATIENT_CLINIC_OR_DEPARTMENT_OTHER)
Admission: RE | Admit: 2015-10-06 | Discharge: 2015-10-06 | Disposition: A | Discharge status: Home / Self Care | Payer: Medicare Other | Source: Ambulatory Visit | Attending: General Surgery | Admitting: General Surgery

## 2015-10-06 DIAGNOSIS — M479 Spondylosis, unspecified: Secondary | ICD-10-CM | POA: Insufficient documentation

## 2015-10-06 DIAGNOSIS — Z79899 Other long term (current) drug therapy: Secondary | ICD-10-CM | POA: Insufficient documentation

## 2015-10-06 DIAGNOSIS — R159 Full incontinence of feces: Secondary | ICD-10-CM | POA: Diagnosis not present

## 2015-10-06 DIAGNOSIS — Z923 Personal history of irradiation: Secondary | ICD-10-CM | POA: Diagnosis not present

## 2015-10-06 DIAGNOSIS — E785 Hyperlipidemia, unspecified: Secondary | ICD-10-CM | POA: Insufficient documentation

## 2015-10-06 DIAGNOSIS — I10 Essential (primary) hypertension: Secondary | ICD-10-CM | POA: Diagnosis not present

## 2015-10-06 DIAGNOSIS — Z7982 Long term (current) use of aspirin: Secondary | ICD-10-CM | POA: Insufficient documentation

## 2015-10-06 LAB — POCT I-STAT, CHEM 8
BUN: 13 mg/dL (ref 6–20)
Calcium, Ion: 1.26 mmol/L (ref 1.13–1.30)
Chloride: 107 mmol/L (ref 101–111)
Creatinine, Ser: 0.7 mg/dL (ref 0.44–1.00)
Glucose, Bld: 100 mg/dL — ABNORMAL HIGH (ref 65–99)
HCT: 45 % (ref 36.0–46.0)
Hemoglobin: 15.3 g/dL — ABNORMAL HIGH (ref 12.0–15.0)
Potassium: 4.1 mmol/L (ref 3.5–5.1)
Sodium: 145 mmol/L (ref 135–145)
TCO2: 23 mmol/L (ref 0–100)

## 2015-10-06 SURGERY — INSERTION, NEUROSTIMULATOR, SACRAL
Anesthesia: Monitor Anesthesia Care

## 2015-10-06 MED ORDER — ACETAMINOPHEN 325 MG PO TABS
325.0000 mg | ORAL_TABLET | ORAL | Status: DC | PRN
  Filled 2015-10-06: qty 2

## 2015-10-06 MED ORDER — OXYCODONE HCL 5 MG/5ML PO SOLN
5.0000 mg | Freq: Once | ORAL | Status: DC | PRN
  Filled 2015-10-06: qty 5

## 2015-10-06 MED ORDER — SODIUM CHLORIDE 0.9% FLUSH
3.0000 mL | INTRAVENOUS | Status: DC | PRN
  Filled 2015-10-06: qty 3

## 2015-10-06 MED ORDER — FENTANYL CITRATE (PF) 100 MCG/2ML IJ SOLN
INTRAMUSCULAR | Status: AC
  Filled 2015-10-06: qty 2

## 2015-10-06 MED ORDER — BUPIVACAINE-EPINEPHRINE 0.5% -1:200000 IJ SOLN
INTRAMUSCULAR | Status: DC | PRN
  Administered 2015-10-06: 11 mL

## 2015-10-06 MED ORDER — FENTANYL CITRATE (PF) 100 MCG/2ML IJ SOLN
INTRAMUSCULAR | Status: DC | PRN
  Administered 2015-10-06 (×2): 25 ug via INTRAVENOUS

## 2015-10-06 MED ORDER — PROPOFOL 10 MG/ML IV BOLUS
INTRAVENOUS | Status: DC | PRN
  Administered 2015-10-06: 20 mg via INTRAVENOUS

## 2015-10-06 MED ORDER — ONDANSETRON HCL 4 MG/2ML IJ SOLN
INTRAMUSCULAR | Status: DC | PRN
  Administered 2015-10-06: 4 mg via INTRAVENOUS

## 2015-10-06 MED ORDER — SCOPOLAMINE 1 MG/3DAYS TD PT72
MEDICATED_PATCH | TRANSDERMAL | Status: DC | PRN
  Administered 2015-10-06: 1 via TRANSDERMAL

## 2015-10-06 MED ORDER — LIDOCAINE HCL (CARDIAC) 20 MG/ML IV SOLN
INTRAVENOUS | Status: AC
  Filled 2015-10-06: qty 5

## 2015-10-06 MED ORDER — PROPOFOL 500 MG/50ML IV EMUL
INTRAVENOUS | Status: AC
  Filled 2015-10-06: qty 50

## 2015-10-06 MED ORDER — SODIUM CHLORIDE 0.9 % IV SOLN
250.0000 mL | INTRAVENOUS | Status: DC | PRN
  Filled 2015-10-06: qty 250

## 2015-10-06 MED ORDER — DEXAMETHASONE SODIUM PHOSPHATE 10 MG/ML IJ SOLN
INTRAMUSCULAR | Status: AC
  Filled 2015-10-06: qty 1

## 2015-10-06 MED ORDER — LIDOCAINE HCL (CARDIAC) 20 MG/ML IV SOLN
INTRAVENOUS | Status: DC | PRN
  Administered 2015-10-06: 50 mg via INTRAVENOUS

## 2015-10-06 MED ORDER — ACETAMINOPHEN 160 MG/5ML PO SOLN
325.0000 mg | ORAL | Status: DC | PRN
  Filled 2015-10-06: qty 20.3

## 2015-10-06 MED ORDER — FENTANYL CITRATE (PF) 100 MCG/2ML IJ SOLN
25.0000 ug | INTRAMUSCULAR | Status: DC | PRN
  Filled 2015-10-06: qty 1

## 2015-10-06 MED ORDER — DEXAMETHASONE SODIUM PHOSPHATE 4 MG/ML IJ SOLN
INTRAMUSCULAR | Status: DC | PRN
  Administered 2015-10-06: 10 mg via INTRAVENOUS

## 2015-10-06 MED ORDER — SODIUM CHLORIDE 0.9% FLUSH
3.0000 mL | Freq: Two times a day (BID) | INTRAVENOUS | Status: DC
  Filled 2015-10-06: qty 3

## 2015-10-06 MED ORDER — PHENYLEPHRINE 40 MCG/ML (10ML) SYRINGE FOR IV PUSH (FOR BLOOD PRESSURE SUPPORT)
PREFILLED_SYRINGE | INTRAVENOUS | Status: AC
  Filled 2015-10-06: qty 10

## 2015-10-06 MED ORDER — ONDANSETRON HCL 4 MG/2ML IJ SOLN
INTRAMUSCULAR | Status: AC
  Filled 2015-10-06: qty 2

## 2015-10-06 MED ORDER — ACETAMINOPHEN 650 MG RE SUPP
650.0000 mg | RECTAL | Status: DC | PRN
  Filled 2015-10-06: qty 1

## 2015-10-06 MED ORDER — SCOPOLAMINE 1 MG/3DAYS TD PT72
MEDICATED_PATCH | TRANSDERMAL | Status: AC
  Filled 2015-10-06: qty 1

## 2015-10-06 MED ORDER — OXYCODONE HCL 5 MG PO TABS
5.0000 mg | ORAL_TABLET | Freq: Once | ORAL | Status: DC | PRN
  Filled 2015-10-06: qty 1

## 2015-10-06 MED ORDER — PROPOFOL 500 MG/50ML IV EMUL
INTRAVENOUS | Status: DC | PRN
Start: 2015-10-06 — End: 2015-10-06
  Administered 2015-10-06: 200 ug/kg/min via INTRAVENOUS

## 2015-10-06 MED ORDER — LACTATED RINGERS IV SOLN
INTRAVENOUS | Status: DC
  Administered 2015-10-06: 08:00:00 via INTRAVENOUS
  Filled 2015-10-06: qty 1000

## 2015-10-06 MED ORDER — ACETAMINOPHEN 325 MG PO TABS
650.0000 mg | ORAL_TABLET | ORAL | Status: DC | PRN
  Filled 2015-10-06: qty 2

## 2015-10-06 SURGICAL SUPPLY — 50 items
ANTNA NRSTM XTRN TELEM NS LF (UROLOGICAL SUPPLIES)
APL SKNCLS STERI-STRIP NONHPOA (GAUZE/BANDAGES/DRESSINGS)
BAG URINE LEG 500ML (DRAIN) IMPLANT
BENZOIN TINCTURE PRP APPL 2/3 (GAUZE/BANDAGES/DRESSINGS) IMPLANT
BLADE HEX COATED 2.75 (ELECTRODE) ×2 IMPLANT
BLADE SURG 15 STRL LF DISP TIS (BLADE) ×1 IMPLANT
BLADE SURG 15 STRL SS (BLADE) ×2
CATH FOLEY 2WAY SLVR  5CC 16FR (CATHETERS)
CATH FOLEY 2WAY SLVR 5CC 16FR (CATHETERS) IMPLANT
CHLORAPREP W/TINT 26ML (MISCELLANEOUS) ×2 IMPLANT
COVER BACK TABLE 60X90IN (DRAPES) ×2 IMPLANT
COVER MAYO STAND STRL (DRAPES) ×2 IMPLANT
COVER PROBE W GEL 5X96 (DRAPES) IMPLANT
DRAPE C-ARM 42X72 X-RAY (DRAPES) IMPLANT
DRAPE INCISE IOBAN 66X45 STRL (DRAPES) ×2 IMPLANT
DRAPE LAPAROSCOPIC ABDOMINAL (DRAPES) ×2 IMPLANT
DRAPE LG THREE QUARTER DISP (DRAPES) IMPLANT
DRAPE UTILITY XL STRL (DRAPES) ×2 IMPLANT
DRSG TEGADERM 4X4.75 (GAUZE/BANDAGES/DRESSINGS) ×2 IMPLANT
ELECT REM PT RETURN 9FT ADLT (ELECTROSURGICAL) ×2
ELECTRODE REM PT RTRN 9FT ADLT (ELECTROSURGICAL) ×1 IMPLANT
GLOVE BIO SURGEON STRL SZ 6.5 (GLOVE) ×2 IMPLANT
GLOVE INDICATOR 7.0 STRL GRN (GLOVE) ×2 IMPLANT
GOWN STRL REUS W/TWL 2XL LVL3 (GOWN DISPOSABLE) ×2 IMPLANT
INTRODUCER GUIDE DILATR SHEATH (SET/KITS/TRAYS/PACK) IMPLANT
KIT ROOM TURNOVER WOR (KITS) ×2 IMPLANT
LIQUID BAND (GAUZE/BANDAGES/DRESSINGS) ×2 IMPLANT
MANIFOLD NEPTUNE II (INSTRUMENTS) IMPLANT
NEEDLE FORAMEN 20GA 3.5  9CM (NEEDLE) IMPLANT
NEEDLE FORAMEN 20GA 5  12.5CM (NEEDLE) IMPLANT
NEEDLE HYPO 22GX1.5 SAFETY (NEEDLE) ×2 IMPLANT
PACK BASIN DAY SURGERY FS (CUSTOM PROCEDURE TRAY) ×2 IMPLANT
PAD ARMBOARD 7.5X6 YLW CONV (MISCELLANEOUS) IMPLANT
PENCIL BUTTON HOLSTER BLD 10FT (ELECTRODE) ×2 IMPLANT
PROGRAMMER ANTENNA EXT (UROLOGICAL SUPPLIES) IMPLANT
PROGRAMMER STIMUL 2.2X1.1X3.7 (UROLOGICAL SUPPLIES) IMPLANT
SPONGE GAUZE 4X4 12PLY STER LF (GAUZE/BANDAGES/DRESSINGS) IMPLANT
STAPLER VISISTAT 35W (STAPLE) IMPLANT
STRIP CLOSURE SKIN 1/2X4 (GAUZE/BANDAGES/DRESSINGS) IMPLANT
SUT SILK 2 0 TIES 17X18 (SUTURE)
SUT SILK 2-0 18XBRD TIE BLK (SUTURE) IMPLANT
SUT VIC AB 3-0 SH 27 (SUTURE) ×2
SUT VIC AB 3-0 SH 27X BRD (SUTURE) ×1 IMPLANT
SUT VIC AB 4-0 PS2 18 (SUTURE) ×1 IMPLANT
SUT VICRYL 4-0 PS2 18IN ABS (SUTURE) ×2 IMPLANT
SYR BULB IRRIGATION 50ML (SYRINGE) ×2 IMPLANT
SYR CONTROL 10ML LL (SYRINGE) ×2 IMPLANT
TOWEL OR 17X24 6PK STRL BLUE (TOWEL DISPOSABLE) ×4 IMPLANT
TUBE CONNECTING 12X1/4 (SUCTIONS) IMPLANT
WATER STERILE IRR 500ML POUR (IV SOLUTION) ×2 IMPLANT

## 2015-10-06 NOTE — Anesthesia Postprocedure Evaluation (Signed)
Anesthesia Post Note  Patient: Janet Cooper  Procedure(s) Performed: Procedure(s) (LRB): Removal SACRAL NERVE STIMULATOR  (N/A)  Patient location during evaluation: PACU Anesthesia Type: MAC Level of consciousness: awake Pain management: pain level controlled Vital Signs Assessment: post-procedure vital signs reviewed and stable Respiratory status: spontaneous breathing Cardiovascular status: stable Postop Assessment: no signs of nausea or vomiting Anesthetic complications: no    Last Vitals:  Filed Vitals:   10/06/15 0945 10/06/15 1017  BP: 120/71 131/76  Pulse: 56 71  Temp: 36.6 C 36.7 C  Resp: 16 18    Last Pain:  Filed Vitals:   10/06/15 1017  PainSc: 0-No pain                 Laila Myhre

## 2015-10-06 NOTE — Anesthesia Preprocedure Evaluation (Addendum)
Anesthesia Evaluation  Patient identified by MRN, date of birth, ID band Patient awake    Reviewed: Allergy & Precautions, NPO status , Patient's Chart, lab work & pertinent test results  History of Anesthesia Complications (+) PONV and history of anesthetic complications  Airway Mallampati: I  TM Distance: >3 FB Neck ROM: Full    Dental  (+) Teeth Intact   Pulmonary neg pulmonary ROS,    breath sounds clear to auscultation       Cardiovascular Exercise Tolerance: Good hypertension, Pt. on medications (-) angina(-) Past MI and (-) CHF  Rhythm:Regular  01-Sep-2014  Normal sinus rhythm Left axis deviation Anterior infarct , age undetermined Abnormal ECG   Neuro/Psych  Neuromuscular disease negative psych ROS   GI/Hepatic negative GI ROS, Neg liver ROS,   Endo/Other  negative endocrine ROS  Renal/GU negative Renal ROS Bladder dysfunction      Musculoskeletal  (+) Arthritis ,   Abdominal   Peds  Hematology negative hematology ROS (+)   Anesthesia Other Findings   Reproductive/Obstetrics negative OB ROS                          Anesthesia Physical Anesthesia Plan  ASA: II  Anesthesia Plan: MAC   Post-op Pain Management:    Induction: Intravenous  Airway Management Planned: Natural Airway, Nasal Cannula and Simple Face Mask  Additional Equipment: None  Intra-op Plan:   Post-operative Plan:   Informed Consent: I have reviewed the patients History and Physical, chart, labs and discussed the procedure including the risks, benefits and alternatives for the proposed anesthesia with the patient or authorized representative who has indicated his/her understanding and acceptance.   Dental advisory given  Plan Discussed with: CRNA, Anesthesiologist and Surgeon  Anesthesia Plan Comments:        Anesthesia Quick Evaluation

## 2015-10-06 NOTE — Op Note (Signed)
10/06/2015  8:57 AM  PATIENT:  Janet Cooper  82 y.o. female  Patient Care Team: Amy Everardo Beals, PA-C as PCP - General (Internal Medicine)  PRE-OPERATIVE DIAGNOSIS:  Fecal Incontinence  POST-OPERATIVE DIAGNOSIS:  Fecal Incontinence  PROCEDURE:  Removal of SACRAL NERVE STIMULATOR   Surgeon(s): [name], MD  ASSISTANT: none   ANESTHESIA:   local  EBL:  Total I/O In: 100 [I.V.:100] Out: 5 [Blood:5]  DRAINS: none   SPECIMEN:  Source of Specimen:  SNS  DISPOSITION OF SPECIMEN:  pathology for gross only  COUNTS:  YES  PLAN OF CARE: Discharge to home after PACU  PATIENT DISPOSITION:  PACU - hemodynamically stable.  INDICATION: 82 y.o. F with SNS stimulator implanted last year for incontinence.  She has not gotten any long term results in the area that she wanted and has requested that it be removed.     OR FINDINGS: intact SNS  DESCRIPTION: the patient was identified in the preoperative holding area and taken to the OR where they were laid prone on the operating room table.  MAC anesthesia was induced without difficulty. SCDs were also noted to be in place prior to the initiation of anesthesia.  The patient was then prepped and draped in the usual sterile fashion.   A surgical timeout was performed indicating the correct patient, procedure, positioning and need for preoperative antibiotics.   The area around the pocket on the patient's right side was anesthetized with local Marcaine. An incision was made over the previous scar. Dissection was carried down to the level of the battery device. The battery was removed without difficulty. The lead was intact. I then gently pulled on the lead and identified the insertion point of the stimulator wire. An incision was made over this site as well with a scalpel. Dissection was carried down around the wire to the level of the sacrum. The lead was then gently pulled out of the sacral foramen intact. Direct pressure was used for  hemostasis. The cavity of the battery pocket was closed using a 3-0 Vicryl for the subcutaneous pocket. A 4-0 Vicryl was used to gently reapproximate the skin. The insertion point incision was also closed with a 3-0 Vicryl suture. Skin adhesive was placed on both incisions. Added local anesthesia was placed around the incision. The patient was then awakened from anesthesia and sent to the post anesthesia care unit in stable condition. All counts were correct per operating room staff.

## 2015-10-06 NOTE — Discharge Instructions (Addendum)
GENERAL SURGERY: POST OP INSTRUCTIONS  1. DIET: Follow a light bland diet the first 24 hours after arrival home, such as soup, liquids, crackers, etc.  Be sure to include lots of fluids daily.  Avoid fast food or heavy meals as your are more likely to get nauseated.   2. Take your usually prescribed home medications unless otherwise directed. 3. PAIN CONTROL: a. Pain is best controlled by a usual combination of three different methods TOGETHER: i. Ice/Heat ii. Over the counter pain medication iii. Prescription pain medication b. Most patients will experience some swelling and bruising around the incisions.  Ice packs or heating pads (30-60 minutes up to 6 times a day) will help. Use ice for the first few days to help decrease swelling and bruising, then switch to heat to help relax tight/sore spots and speed recovery.  Some people prefer to use ice alone, heat alone, alternating between ice & heat.  Experiment to what works for you.  Swelling and bruising can take several weeks to resolve.   c. It is helpful to take an over-the-counter pain medication regularly for the first few weeks.  Choose one of the following that works best for you: i. Naproxen (Aleve, etc)  Two 220mg  tabs twice a day ii. Ibuprofen (Advil, etc) Three 200mg  tabs four times a day (every meal & bedtime) 4. Wash / shower every day.  You may shower over the dressings as they are waterproof.   5. ACTIVITIES as tolerated:   a. You may resume regular (light) daily activities beginning the next day--such as daily self-care, walking, climbing stairs--gradually increasing activities as tolerated.  If you can walk 30 minutes without difficulty, it is safe to try more intense activity such as jogging, treadmill, bicycling, low-impact aerobics, swimming, etc. b. Save the most intensive and strenuous activity for last such as sit-ups, heavy lifting, contact sports, etc  Refrain from any heavy lifting or straining until you are off narcotics  for pain control.   c. DO NOT PUSH THROUGH PAIN.  Let pain be your guide: If it hurts to do something, don't do it.  Pain is your body warning you to avoid that activity for another week until the pain goes down. d. You may drive when you are no longer taking prescription pain medication, you can comfortably wear a seatbelt, and you can safely maneuver your car and apply brakes. e. Janet Cooper may have sexual intercourse when it is comfortable.  6. FOLLOW UP in our office a. Please call [facility] at [phone] to set up an appointment to see your surgeon in the office for a follow-up appointment approximately 2-3 weeks after your surgery. b. Make sure that you call for this appointment the day you arrive home to insure a convenient appointment time. 9. IF YOU HAVE DISABILITY OR FAMILY LEAVE FORMS, BRING THEM TO THE OFFICE FOR PROCESSING.  DO NOT GIVE THEM TO YOUR DOCTOR.   WHEN TO CALL us [phone]: 1. Poor pain control 2. Reactions / problems with new medications (rash/itching, nausea, etc)  3. Fever over 101.5 F (38.5 C) 4. Worsening swelling or bruising 5. Continued bleeding from incision. 6. Increased pain, redness, or drainage from the incision   The clinic staff is available to answer your questions during regular business hours (8:30am-5pm).  Please dont hesitate to call and ask to speak to one of our nurses for clinical concerns.   If you have a medical emergency, go to the nearest emergency room or call 911.  A surgeon from [facility] is always on call at the [facility], [place], [address], [place], [place]  [address] ? MAIN: [phone] ? TOLL FREE: [phone] ?  FAX [phone] [email or web address]   Post Anesthesia Home Care Instructions  Activity: Get plenty of rest for the remainder of the day. A responsible adult should stay with you for 24 hours following the procedure.  For the next 24 hours, DO  NOT: -Drive a car -Paediatric nurse -Drink alcoholic beverages -Take any medication unless instructed by your physician -Make any legal decisions or sign important papers.  Meals: Start with liquid foods such as gelatin or soup. Progress to regular foods as tolerated. Avoid greasy, spicy, heavy foods. If nausea and/or vomiting occur, drink only clear liquids until the nausea and/or vomiting subsides. Call your physician if vomiting continues.  Special Instructions/Symptoms: Your throat may feel dry or sore from the anesthesia or the breathing tube placed in your throat during surgery. If this causes discomfort, gargle with warm salt water. The discomfort should disappear within 24 hours.  If you had a scopolamine patch placed behind your ear for the management of post- operative nausea and/or vomiting:  1. The medication in the patch is effective for 72 hours, after which it should be removed.  Wrap patch in a tissue and discard in the trash. Wash hands thoroughly with soap and water. 2. You may remove the patch earlier than 72 hours if you experience unpleasant side effects which may include dry mouth, dizziness or visual disturbances. 3. Avoid touching the patch. Wash your hands with soap and water after contact with the patch.

## 2015-10-06 NOTE — H&P (Signed)
Janet Cooper is an 82 y.o. female.   Chief Complaint: fecal incontinence HPI: 82 y.o. F with fecal incontinence who underwent a sacral nerve stimulator placement last year.  Despite having improved incontinence, she has been unhappy with the long term results because it hasn't improved her urgency symptoms.  Past Medical History  Diagnosis Date  . Hypertension   . Osteopenia 05/2013    T score -2.1 FRAX 8.9%/2.5%  . Hyperlipidemia   . History of endometrial cancer     1990 dx  Stage IB, Grade 1 endometrial carcinoma  s/p TAH w/ BSO--  recurrence 1994 vaginal cuff  s/p radiation  . Fecal incontinence   . Urge urinary incontinence   . OAB (overactive bladder)   . Chronic cystitis   . Detrusor instability of bladder   . Glaucoma of both eyes   . History of radiation therapy     1994  lower pelvic area for recurrent endometrial cancer at vaginal cuff  . Arthritis     back  . Wears glasses     Past Surgical History  Procedure Laterality Date  . Patella fracture surgery Left 1995  . Anal rectal manometry N/A 03/29/2014    Procedure: ANO RECTAL MANOMETRY;  Surgeon: [name], MD;  Location: [facility];  Service: Endoscopy;  Laterality: N/A;  . Anal rectal manometry N/A 04/28/2014    Procedure: ANO RECTAL MANOMETRY;  Surgeon: [name], MD;  Location: [facility];  Service: Endoscopy;  Laterality: N/A;  . Total abdominal hysterectomy w/ bilateral salpingoophorectomy  1990  . Cataract extraction w/ intraocular lens  implant, bilateral  1990's  . Glaucoma surgery Bilateral 2010    laser  . Sacral nerve lead implantation -- stage one interstim  [id number]   dr [name]    fecal incontinence  . Stage two sacral nerve stimulator interstim  09-15-2014    Family History  Problem Relation Age of Onset  . Colon cancer Mother   . Cancer Father     Liver cancer  . Heart disease Maternal Grandmother   . Heart disease Maternal Grandfather    Social History:  reports  that she has never smoked. She has never used smokeless tobacco. She reports that she drinks about 0.5 oz of alcohol per week. She reports that she does not use illicit drugs.  Allergies:  Allergies  Allergen Reactions  . Hydrocodone Itching  . Sulfa Antibiotics Itching    Medications Prior to Admission  Medication Sig Dispense Refill  . acetaminophen (TYLENOL) 500 MG tablet Take 500-1,000 mg by mouth every 6 (six) hours as needed. Reported on 07/19/2015    . amLODipine-olmesartan (AZOR) 5-40 MG per tablet Take 1 tablet by mouth every morning.     Marland Kitchen aspirin 81 MG tablet Take 81 mg by mouth daily.    . calcium-vitamin D (OSCAL WITH D) 500-200 MG-UNIT per tablet Take 1 tablet by mouth 2 (two) times daily.    . Cholecalciferol (VITAMIN D3) 2000 UNITS TABS Take 1 capsule by mouth daily.    . dorzolamide-timolol (COSOPT) 22.3-6.8 MG/ML ophthalmic solution Place 1 drop into both eyes 2 (two) times daily.    Marland Kitchen glucosamine-chondroitin 500-400 MG tablet Take 1 tablet by mouth 2 (two) times daily.    Marland Kitchen latanoprost (XALATAN) 0.005 % ophthalmic solution Place 1 drop into both eyes at bedtime.     . mirabegron ER (MYRBETRIQ) 50 MG TB24 tablet Take 50 mg by mouth every evening. Reported on 07/19/2015    .  simvastatin (ZOCOR) 20 MG tablet Take 10 mg by mouth every evening.     . Omega-3 Fatty Acids (FISH OIL PO) Take 1 g by mouth 2 (two) times daily.       No results found for this or any previous visit (from the past 48 hour(s)). No results found.  Review of Systems  Constitutional: Negative for fever and chills.  Eyes: Negative for blurred vision.  Respiratory: Negative for cough and shortness of breath.   Cardiovascular: Negative for chest pain and palpitations.  Gastrointestinal: Negative for nausea, vomiting and abdominal pain.  Genitourinary: Negative for dysuria, urgency and frequency.  Musculoskeletal: Negative for myalgias.  Skin: Negative for rash.  Neurological: Negative for dizziness,  tingling and headaches.    Blood pressure 128/77, pulse 65, temperature 97.5 F (36.4 C), temperature source Oral, resp. rate 16, height 5\' 1"  (1.549 m), weight 51.71 kg (114 lb), SpO2 98 %. Physical Exam  Constitutional: She is oriented to person, place, and time. She appears well-developed and well-nourished.  HENT:  Head: Normocephalic and atraumatic.  Eyes: Conjunctivae are normal. Pupils are equal, round, and reactive to light.  Neck: Normal range of motion. Neck supple.  Cardiovascular: Normal rate and regular rhythm.   Respiratory: Effort normal and breath sounds normal.  GI: Soft. She exhibits no distension. There is no tenderness. There is no rebound.  Musculoskeletal: Normal range of motion.  Neurological: She is alert and oriented to person, place, and time.  Skin: Skin is warm and dry.     Assessment/Plan 82 y.o. F with SNS.  She desires to have this removed.  All efforts were made to adjust her program and confirm it was working properly with no change in her decision.  I will remove this today.  Risks include bleeding, infection and damage to adjacent structures.  I believe she understands this and has agreed to proceed.  Rosario Adie., MD [id number], 7:42 AM

## 2015-10-06 NOTE — Transfer of Care (Signed)
Immediate Anesthesia Transfer of Care Note  Patient: Janet Cooper  Procedure(s) Performed: Procedure(s): Removal SACRAL NERVE STIMULATOR  (N/A)  Patient Location: PACU  Anesthesia Type:MAC  Level of Consciousness: awake, alert , oriented and patient cooperative  Airway & Oxygen Therapy: Patient Spontanous Breathing and Patient connected to nasal cannula oxygen  Post-op Assessment: Report given to RN and Post -op Vital signs reviewed and stable  Post vital signs: Reviewed and stable  Last Vitals:  Filed Vitals:   10/06/15 0737  BP: 128/77  Pulse: 65  Temp: 36.4 C  Resp: 16    Last Pain: There were no vitals filed for this visit.    Patients Stated Pain Goal: 7 ([id number])  Complications: No apparent anesthesia complications

## 2015-10-06 NOTE — Anesthesia Procedure Notes (Signed)
Procedure Name: MAC Date/Time: 10/06/2015 8:25 AM Performed by: Wanita Chamberlain Pre-anesthesia Checklist: Patient identified, Timeout performed, Emergency Drugs available, Suction available and Patient being monitored Patient Re-evaluated:Patient Re-evaluated prior to inductionOxygen Delivery Method: Nasal cannula Intubation Type: IV induction Placement Confirmation: positive ETCO2 Dental Injury: Teeth and Oropharynx as per pre-operative assessment

## 2016-01-05 ENCOUNTER — Ambulatory Visit (INDEPENDENT_AMBULATORY_CARE_PROVIDER_SITE_OTHER): Payer: Medicare Other | Admitting: Podiatry

## 2016-01-05 VITALS — BP 150/93 | HR 73 | Resp 12

## 2016-01-05 DIAGNOSIS — L03031 Cellulitis of right toe: Secondary | ICD-10-CM | POA: Diagnosis not present

## 2016-01-05 DIAGNOSIS — L600 Ingrowing nail: Secondary | ICD-10-CM | POA: Diagnosis not present

## 2016-01-05 DIAGNOSIS — M2041 Other hammer toe(s) (acquired), right foot: Secondary | ICD-10-CM

## 2016-01-05 NOTE — Progress Notes (Signed)
   Subjective:    Patient ID: Janet Cooper, female    DOB: 1930-01-14, 82 y.o.   MRN: [id number]  HPI this patient presents the office with chief complaint of pain in the big toe of the right foot. She says that 2 days ago she developed extreme pain noted along the inside border the big toe, right foot. She says now the toenail is not hurting, but she presents to the office for an evaluation. She also is concerned about the second toe on the right foot. She presents the office for an evaluation and treatment of this condition    Review of Systems  All other systems reviewed and are negative.      Objective:   Physical Exam GENERAL APPEARANCE: Alert, conversant. Appropriately groomed. No acute distress.  VASCULAR: Pedal pulses are  palpable at  [facility] and PT bilateral.  Capillary refill time is immediate to all digits,  Normal temperature gradient.  Digital hair growth is present bilateral  NEUROLOGIC: sensation is normal to 5.07 monofilament at 5/5 sites bilateral.  Light touch is intact bilateral, Muscle strength normal.  MUSCULOSKELETAL: acceptable muscle strength, tone and stability bilateral.  Intrinsic muscluature intact bilateral.  Rectus appearance of foot and digits noted bilateral. Hammer toe second toe right foot.  DERMATOLOGIC: skin color, texture, and turgor are within normal limits.  No preulcerative lesions or ulcers  are seen, no interdigital maceration noted.  No open lesions present. . No drainage noted.  NAILS  Marked incurvation medial border right great toe.  No signs of granulation noted.              Assessment & Plan:  Ingrown nail  Paronychia medial border right hallux toenail  Hammer toe second right  IE   Incision and drainage of the medial border of the right great toe. Discussed hammertoe correction for her second toe of the right foot which she presently says is not painful lesion to call the office as needed. Discussed permanent correction of the  ingrowing toenail in the future as needed  Gardiner Barefoot DPM

## 2016-02-28 DIAGNOSIS — E785 Hyperlipidemia, unspecified: Secondary | ICD-10-CM | POA: Insufficient documentation

## 2016-02-28 DIAGNOSIS — I10 Essential (primary) hypertension: Secondary | ICD-10-CM | POA: Insufficient documentation

## 2016-08-15 ENCOUNTER — Other Ambulatory Visit: Payer: Self-pay | Admitting: Family Medicine

## 2016-08-15 DIAGNOSIS — Z1231 Encounter for screening mammogram for malignant neoplasm of breast: Secondary | ICD-10-CM

## 2016-08-30 ENCOUNTER — Other Ambulatory Visit: Payer: Self-pay | Admitting: Family Medicine

## 2016-08-30 DIAGNOSIS — M8580 Other specified disorders of bone density and structure, unspecified site: Secondary | ICD-10-CM

## 2016-08-31 ENCOUNTER — Ambulatory Visit
Admission: RE | Admit: 2016-08-31 | Discharge: 2016-08-31 | Disposition: A | Discharge status: Home / Self Care | Payer: Medicare Other | Source: Ambulatory Visit | Attending: Family Medicine | Admitting: Family Medicine

## 2016-08-31 DIAGNOSIS — Z1231 Encounter for screening mammogram for malignant neoplasm of breast: Secondary | ICD-10-CM

## 2016-08-31 DIAGNOSIS — M8580 Other specified disorders of bone density and structure, unspecified site: Secondary | ICD-10-CM

## 2016-10-23 DIAGNOSIS — R7301 Impaired fasting glucose: Secondary | ICD-10-CM | POA: Insufficient documentation

## 2017-07-30 ENCOUNTER — Other Ambulatory Visit: Payer: Self-pay | Admitting: Family Medicine

## 2017-07-30 DIAGNOSIS — Z1231 Encounter for screening mammogram for malignant neoplasm of breast: Secondary | ICD-10-CM

## 2017-09-06 ENCOUNTER — Ambulatory Visit: Payer: Medicare Other

## 2017-10-10 ENCOUNTER — Ambulatory Visit
Admission: RE | Admit: 2017-10-10 | Discharge: 2017-10-10 | Disposition: A | Discharge status: Home / Self Care | Payer: Medicare Other | Source: Ambulatory Visit | Attending: Family Medicine | Admitting: Family Medicine

## 2017-10-10 DIAGNOSIS — Z1231 Encounter for screening mammogram for malignant neoplasm of breast: Secondary | ICD-10-CM

## 2018-02-10 ENCOUNTER — Other Ambulatory Visit: Payer: Self-pay | Admitting: Family Medicine

## 2018-02-10 DIAGNOSIS — M79604 Pain in right leg: Secondary | ICD-10-CM

## 2018-02-10 DIAGNOSIS — R609 Edema, unspecified: Secondary | ICD-10-CM

## 2018-02-12 ENCOUNTER — Ambulatory Visit
Admission: RE | Admit: 2018-02-12 | Discharge: 2018-02-12 | Disposition: A | Discharge status: Home / Self Care | Payer: Medicare Other | Source: Ambulatory Visit | Attending: Family Medicine | Admitting: Family Medicine

## 2018-02-12 DIAGNOSIS — R609 Edema, unspecified: Secondary | ICD-10-CM

## 2018-02-12 DIAGNOSIS — M79604 Pain in right leg: Secondary | ICD-10-CM

## 2018-03-24 DIAGNOSIS — F329 Major depressive disorder, single episode, unspecified: Secondary | ICD-10-CM | POA: Insufficient documentation

## 2018-08-15 ENCOUNTER — Ambulatory Visit
Admission: RE | Admit: 2018-08-15 | Discharge: 2018-08-15 | Disposition: A | Discharge status: Home / Self Care | Payer: Medicare Other | Source: Ambulatory Visit | Attending: Family Medicine | Admitting: Family Medicine

## 2018-08-15 ENCOUNTER — Other Ambulatory Visit: Payer: Self-pay | Admitting: Family Medicine

## 2018-08-15 DIAGNOSIS — M25512 Pain in left shoulder: Secondary | ICD-10-CM

## 2018-08-15 DIAGNOSIS — M25511 Pain in right shoulder: Secondary | ICD-10-CM

## 2018-09-11 ENCOUNTER — Other Ambulatory Visit: Payer: Self-pay | Admitting: Family Medicine

## 2018-09-11 DIAGNOSIS — Z1231 Encounter for screening mammogram for malignant neoplasm of breast: Secondary | ICD-10-CM

## 2018-11-11 ENCOUNTER — Ambulatory Visit
Admission: RE | Admit: 2018-11-11 | Discharge: 2018-11-11 | Disposition: A | Discharge status: Home / Self Care | Payer: Medicare Other | Source: Ambulatory Visit | Attending: Family Medicine | Admitting: Family Medicine

## 2018-11-11 ENCOUNTER — Other Ambulatory Visit: Payer: Self-pay

## 2018-11-11 DIAGNOSIS — Z1231 Encounter for screening mammogram for malignant neoplasm of breast: Secondary | ICD-10-CM

## 2019-02-05 ENCOUNTER — Encounter: Payer: Self-pay | Admitting: Neurology

## 2019-02-10 ENCOUNTER — Encounter: Payer: Self-pay | Admitting: Neurology

## 2019-02-10 ENCOUNTER — Ambulatory Visit: Payer: Medicare Other | Admitting: Neurology

## 2019-02-10 ENCOUNTER — Other Ambulatory Visit: Payer: Self-pay

## 2019-02-10 VITALS — BP 134/84 | HR 98 | Temp 98.4°F | Ht 60.0 in | Wt 121.0 lb

## 2019-02-10 DIAGNOSIS — H9192 Unspecified hearing loss, left ear: Secondary | ICD-10-CM | POA: Diagnosis not present

## 2019-02-10 DIAGNOSIS — H8112 Benign paroxysmal vertigo, left ear: Secondary | ICD-10-CM | POA: Diagnosis not present

## 2019-02-10 DIAGNOSIS — R110 Nausea: Secondary | ICD-10-CM | POA: Diagnosis not present

## 2019-02-10 NOTE — Progress Notes (Signed)
Provider:  Larey Seat, M D  Referring Provider: Aletha Halim., PA-C Primary Care Physician:  Aletha Halim., PA-C  Chief Complaint  Patient presents with  . New Patient (Initial Visit)    pt alone, rm 10. pt states that in first of august she complained to PCP of dizziness. states that it has been off and on and only seems to be related to when she stands up or moves quickly. states that she has tried meclizine and it didnt help made her drowsy. they also started her on trazadone to help her sleep 50 mg and that cause her to sleep 2 days. she stopped all of the meds because of side effects    HPI:  Janet Cooper is a 85 y.o. female patient of [place] descent, right-handed, and seen on 10 February 2019 as a new patient for the evaluation of dizziness.  This is upon a referral from PA. Deatra Ina for dizziness.  The patient was seen for dizziness on August 1st, and was given meclizine tid , which made her drowsy and yawning. Trazodone was also prescribed.and left her sleeping for 36 hours. A nasal spray was also prescribed.  She decided to stop all medication August 31 and now feels better for the last 4-6 days.   She had 4 hard weeks behind her , felt usually a little better in the morning , and once she started moving became dizzy, nauseated, and was feeling close to fainting or falling.   She did not feel as if the room was spinning, she did feel pulled into one direction.   BP at home was good, here it is evaluated.   No dizziness today.- she also had no Eppley or Brent -Daroff maneuver yet and did not have vestibular causes ruled out.  Orthostatics: In supine the patient's blood pressure was 148/80 8 mmHg, and a heart rate was 79, regular.   In seated position 136/83 mmHg, heart rate 87,  and standing blood pressure was 134/84 with a heart rate of 98 bpm  Review of Systems: Out of a complete 14 system review, the patient complains of only the following symptoms,  and all other reviewed systems are negative.   The patient reports that she did not feel congested, had no retro-orbital pressure no feeling of pressure in the ear, but  associated headaches.  Side effects are typical for overdosing on several medications all at once.   Social History   Socioeconomic History  . Marital status: Widowed    Spouse name: Not on file  . Number of children: Not on file  . Years of education: Not on file  . Highest education level: Not on file  Occupational History  . Not on file  Social Needs  . Financial resource strain: Not on file  . Food insecurity    Worry: Not on file    Inability: Not on file  . Transportation needs    Medical: Not on file    Non-medical: Not on file  Tobacco Use  . Smoking status: Never Smoker  . Smokeless tobacco: Never Used  Substance and Sexual Activity  . Alcohol use: Not Currently    Alcohol/week: 1.0 standard drinks    Types: 1 Standard drinks or equivalent per week    Comment: Occas  . Drug use: No  . Sexual activity: Not on file  Lifestyle  . Physical activity    Days per week: Not on file    Minutes  per session: Not on file  . Stress: Not on file  Relationships  . Social Herbalist on phone: Not on file    Gets together: Not on file    Attends religious service: Not on file    Active member of club or organization: Not on file    Attends meetings of clubs or organizations: Not on file    Relationship status: Not on file  . Intimate partner violence    Fear of current or ex partner: Not on file    Emotionally abused: Not on file    Physically abused: Not on file    Forced sexual activity: Not on file  Other Topics Concern  . Not on file  Social History Narrative  . Not on file    Family History  Problem Relation Age of Onset  . Colon cancer Mother   . Cancer Father        Liver cancer  . Heart disease Maternal Grandmother   . Heart disease Maternal Grandfather     Past Medical  History:  Diagnosis Date  . Arthritis    back  . Chronic cystitis   . Detrusor instability of bladder   . Fecal incontinence   . Glaucoma of both eyes   . History of endometrial cancer    1990 dx  Stage IB, Grade 1 endometrial carcinoma  s/p TAH w/ BSO--  recurrence 1994 vaginal cuff  s/p radiation  . History of radiation therapy    1994  lower pelvic area for recurrent endometrial cancer at vaginal cuff  . Hyperlipidemia   . Hypertension   . OAB (overactive bladder)   . Osteopenia 05/2013   T score -2.1 FRAX 8.9%/2.5%  . Urge urinary incontinence   . Wears glasses     Past Surgical History:  Procedure Laterality Date  . ANAL RECTAL MANOMETRY N/A 03/29/2014   Procedure: ANO RECTAL MANOMETRY;  Surgeon: [name], MD;  Location: [facility];  Service: Endoscopy;  Laterality: N/A;  . ANAL RECTAL MANOMETRY N/A 04/28/2014   Procedure: ANO RECTAL MANOMETRY;  Surgeon: [name], MD;  Location: [facility];  Service: Endoscopy;  Laterality: N/A;  . CATARACT EXTRACTION W/ INTRAOCULAR LENS  IMPLANT, BILATERAL  1990's  . GLAUCOMA SURGERY Bilateral 2010   laser  . PATELLA FRACTURE SURGERY Left 1995  . SACRAL NERVE LEAD IMPLANTATION -- STAGE ONE INTERSTIM  [id number]   dr [name]   fecal incontinence  . STAGE TWO SACRAL NERVE STIMULATOR INTERSTIM  09-15-2014  . TOTAL ABDOMINAL HYSTERECTOMY W/ BILATERAL SALPINGOOPHORECTOMY  1990    Current Outpatient Medications  Medication Sig Dispense Refill  . amLODipine-olmesartan (AZOR) 5-40 MG per tablet Take 1 tablet by mouth every morning.     Marland Kitchen aspirin 81 MG tablet Take 81 mg by mouth daily.    . calcium-vitamin D (OSCAL WITH D) 500-200 MG-UNIT per tablet Take 1 tablet by mouth 2 (two) times daily.    . Cholecalciferol (VITAMIN D3) 2000 UNITS TABS Take 1 capsule by mouth daily.    . dorzolamide-timolol (COSOPT) 22.3-6.8 MG/ML ophthalmic solution Place 1 drop into both eyes 2 (two) times daily.    Marland Kitchen escitalopram (LEXAPRO) 10 MG  tablet TAKE 1 TABLET BY MOUTH EVERY DAY    . estradiol (ESTRACE) 0.1 MG/GM vaginal cream Place vaginally.    Marland Kitchen glucosamine-chondroitin 500-400 MG tablet Take 1 tablet by mouth 2 (two) times daily.    Marland Kitchen latanoprost (XALATAN) 0.005 % ophthalmic solution Place 1  drop into both eyes at bedtime.     . Omega-3 Fatty Acids (FISH OIL PO) Take 1 g by mouth 2 (two) times daily.     . simvastatin (ZOCOR) 10 MG tablet Take 10 mg by mouth every evening.     . tamsulosin (FLOMAX) 0.4 MG CAPS capsule Take 0.4 mg by mouth every evening.    . timolol (BETIMOL) 0.5 % ophthalmic solution Place 1 drop into both eyes 2 times daily.     No current facility-administered medications for this visit.     Allergies as of 02/10/2019 - Review Complete 02/10/2019  Allergen Reaction Noted  . Homatropine Itching 02/08/2012  . Hydrocodone Itching 03/26/2013  . Sulfa antibiotics Itching 03/26/2013  . Sulfamethoxazole Itching 02/08/2012    Vitals: BP 134/84 (BP Location: Left Arm, Patient Position: Standing)   Pulse 98   Temp 98.4 F (36.9 C)   Ht 5' (1.524 m)   Wt 121 lb (54.9 kg)   BMI 23.63 kg/m  Last Weight:  Wt Readings from Last 1 Encounters:  02/10/19 121 lb (54.9 kg)   Last Height:   Ht Readings from Last 1 Encounters:  02/10/19 5' (1.524 m)    Physical exam:  General: The patient is awake, alert and appears not in acute distress. The patient is well groomed. Head: Normocephalic, atraumatic. Neck is supple. Cardiovascular:  Regular rate and rhythm, without  murmurs or carotid bruit, and without distended neck veins. Respiratory: Lungs are clear to auscultation. Skin:  Without evidence of edema, or rash Trunk: BMI is 23.5,has normal posture.  Neurologic exam : The patient is awake and alert, oriented to place and time.  Memory subjective  described as intact. There is a normal attention span & concentration ability. Speech is fluent without  dysarthria, dysphonia or aphasia. Mood and affect are  appropriate.  Cranial nerves: Pupils are equal and briskly reactive to light. Funduscopic exam deferred.. Extraocular movements  in vertical and horizontal planes intact and without nystagmus.  When the patient was in a seated position on the exam table I was allowed to move her head but she kept her eyes closed and she developed a sense of dizziness with rapid head moving from left to right.  Extension and retroflexion of the neck did not provoke the same response.  Her eye movements now showed a nystagmus with gaze to the left.  I also looked into her ear tube, she has some wax impacting the left sided view on the eardrum, by the right was entirely clear.  However there was no swelling, rash or bleeding noted. Hearing shows that air conduction in the left ear is reduced in comparison to the right especially for finger rubbing.  Visual fields by finger perimetry are intact.  Facial sensation intact to fine touch. Facial motor strength is symmetric and tongue and uvula move midline. Tongue protrusion into either cheek is normal. Shoulder shrug is normal.   Motor exam:   Normal tone ,muscle bulk and symmetric  strength in all extremities. Her grip strength on the left wrist has been reduced ever since a fracture in 2016.   Sensory:  Fine touch, pinprick and vibration were tested in all extremities. Proprioception was normal.  Coordination: Finger-to-nose maneuver normal without evidence of ataxia, dysmetria or tremor.  Gait and station: Patient walks without assistive device and is able unassisted to climb up to the exam table. Strength within normal limits. Stance is stable and normal. Tandem gait deferred.  Negative romberg.  Deep  tendon reflexes: in the  upper and lower extremities are symmetric and intact. Babinski maneuver deferred.   Assessment:  After review of pre-existing records, assessment is that of :   Not orthostatic dizziness.   vertigo is provoked by rapid head turning  left-right . Nystagmus provoked with gaze to  the left. Left ear less acuity of hearing. No facial droop. No ear zoster.   Plan:  Treatment plan and additional workup :  Referral for BPV treatment at PT or with vestibular rehab.   Asencion Partridge Chelcy Bolda MD 02/10/2019

## 2019-02-10 NOTE — Patient Instructions (Signed)
Dizziness Dizziness is a common problem. It is a feeling of unsteadiness or light-headedness. You may feel like you are about to faint. Dizziness can lead to injury if you stumble or fall. Anyone can become dizzy, but dizziness is more common in older adults. This condition can be caused by a number of things, including medicines, dehydration, or illness. Follow these instructions at home: Eating and drinking  Drink enough fluid to keep your urine clear or pale yellow. This helps to keep you from becoming dehydrated. Try to drink more clear fluids, such as water.  Do not drink alcohol.  Limit your caffeine intake if told to do so by your health care provider. Check ingredients and nutrition facts to see if a food or beverage contains caffeine.  Limit your salt (sodium) intake if told to do so by your health care provider. Check ingredients and nutrition facts to see if a food or beverage contains sodium. Activity  Avoid making quick movements. ? Rise slowly from chairs and steady yourself until you feel okay. ? In the morning, first sit up on the side of the bed. When you feel okay, stand slowly while you hold onto something until you know that your balance is fine.  If you need to stand in one place for a long time, move your legs often. Tighten and relax the muscles in your legs while you are standing.  Do not drive or use heavy machinery if you feel dizzy.  Avoid bending down if you feel dizzy. Place items in your home so that they are easy for you to reach without leaning over. Lifestyle  Do not use any products that contain nicotine or tobacco, such as cigarettes and e-cigarettes. If you need help quitting, ask your health care provider.  Try to reduce your stress level by using methods such as yoga or meditation. Talk with your health care provider if you need help to manage your stress. General instructions  Watch your dizziness for any changes.  Take over-the-counter and  prescription medicines only as told by your health care provider. Talk with your health care provider if you think that your dizziness is caused by a medicine that you are taking.  Tell a friend or a family member that you are feeling dizzy. If he or she notices any changes in your behavior, have this person call your health care provider.  Keep all follow-up visits as told by your health care provider. This is important. Contact a health care provider if:  Your dizziness does not go away.  Your dizziness or light-headedness gets worse.  You feel nauseous.  You have reduced hearing.  You have new symptoms.  You are unsteady on your feet or you feel like the room is spinning. Get help right away if:  You vomit or have diarrhea and are unable to eat or drink anything.  You have problems talking, walking, swallowing, or using your arms, hands, or legs.  You feel generally weak.  You are not thinking clearly or you have trouble forming sentences. It may take a friend or family member to notice this.  You have chest pain, abdominal pain, shortness of breath, or sweating.  Your vision changes.  You have any bleeding.  You have a severe headache.  You have neck pain or a stiff neck.  You have a fever. These symptoms may represent a serious problem that is an emergency. Do not wait to see if the symptoms will go away. Get medical help   right away. Call your local emergency services (911 in the [place] Do not drive yourself to the hospital. Summary  Dizziness is a feeling of unsteadiness or light-headedness. This condition can be caused by a number of things, including medicines, dehydration, or illness.  Anyone can become dizzy, but dizziness is more common in older adults.  Drink enough fluid to keep your urine clear or pale yellow. Do not drink alcohol.  Avoid making quick movements if you feel dizzy. Monitor your dizziness for any changes. This information is not intended to  replace advice given to you by your health care provider. Make sure you discuss any questions you have with your health care provider. Document Released: 11/14/2000 Document Revised: 05/24/2017 Document Reviewed: 06/23/2016 Elsevier Patient Education  Welcome. Vertigo Vertigo is the feeling that you or the things around you are moving when they are not. This feeling can come and go at any time. Vertigo often goes away on its own. This condition can be dangerous if it happens when you are doing activities like driving or working with machines. Your doctor will do tests to find the cause of your vertigo. These tests will also help your doctor decide on the best treatment for you. Follow these instructions at home: Eating and drinking      Drink enough fluid to keep your pee (urine) pale yellow.  Do not drink alcohol. Activity  Return to your normal activities as told by your doctor. Ask your doctor what activities are safe for you.  In the morning, first sit up on the side of the bed. When you feel okay, stand slowly while you hold onto something until you know that your balance is fine.  Move slowly. Avoid sudden body or head movements or certain positions, as told by your doctor.  Use a cane if you have trouble standing or walking.  Sit down right away if you feel dizzy.  Avoid doing any tasks or activities that can cause danger to you or others if you get dizzy.  Avoid bending down if you feel dizzy. Place items in your home so that they are easy for you to reach without leaning over.  Do not drive or use heavy machinery if you feel dizzy. General instructions  Take over-the-counter and prescription medicines only as told by your doctor.  Keep all follow-up visits as told by your doctor. This is important. Contact a doctor if:  Your medicine does not help your vertigo.  You have a fever.  Your problems get worse or you have new symptoms.  Your family or  friends see changes in your behavior.  The feeling of being sick to your stomach gets worse.  Your vomiting gets worse.  You lose feeling (have numbness) in part of your body.  You feel prickling and tingling in a part of your body. Get help right away if:  You have trouble moving or talking.  You are always dizzy.  You pass out (faint).  You get very bad headaches.  You feel weak in your hands, arms, or legs.  You have changes in your hearing.  You have changes in how you see (vision).  You get a stiff neck.  Bright light starts to bother you. Summary  Vertigo is the feeling that you or the things around you are moving when they are not.  Your doctor will do tests to find the cause of your vertigo.  You may be told to avoid some tasks, positions, or  movements.  Contact a doctor if your medicine is not helping, or if you have a fever, new symptoms, or a change in behavior.  Get help right away if you get very bad headaches, or if you have changes in how you speak, hear, or see. This information is not intended to replace advice given to you by your health care provider. Make sure you discuss any questions you have with your health care provider. Document Released: 02/28/2008 Document Revised: 04/14/2018 Document Reviewed: 04/14/2018 Elsevier Patient Education  2020 [employer].

## 2019-02-16 ENCOUNTER — Encounter: Payer: Self-pay | Admitting: Physical Therapy

## 2019-02-16 ENCOUNTER — Ambulatory Visit: Payer: Medicare Other | Attending: Neurology | Admitting: Physical Therapy

## 2019-02-16 ENCOUNTER — Other Ambulatory Visit: Payer: Self-pay

## 2019-02-16 DIAGNOSIS — R2681 Unsteadiness on feet: Secondary | ICD-10-CM | POA: Insufficient documentation

## 2019-02-16 DIAGNOSIS — R262 Difficulty in walking, not elsewhere classified: Secondary | ICD-10-CM | POA: Diagnosis present

## 2019-02-16 DIAGNOSIS — R42 Dizziness and giddiness: Secondary | ICD-10-CM | POA: Diagnosis not present

## 2019-02-17 NOTE — Therapy (Addendum)
[facility] [address] [place], [place], [address] Phone: [phone]   Fax:  [phone]  Physical Therapy Evaluation  Patient Details  Name: Janet Cooper MRN: [id number] Date of Birth: [birth year] Referring Provider (PT): Larey Seat, MD   Encounter Date: 02/16/2019  PT End of Session - 02/17/19 1307    Visit Number  1    Number of Visits  9    Date for PT Re-Evaluation  04/18/19    Authorization Type  [employer] MEDICARE; $20 Copay.  10th visit PN    PT Start Time  1745    PT Stop Time  1830    PT Time Calculation (min)  45 min    Activity Tolerance  Patient tolerated treatment well    Behavior During Therapy  WFL for tasks assessed/performed       Past Medical History:  Diagnosis Date  . Arthritis    back  . Chronic cystitis   . Detrusor instability of bladder   . Fecal incontinence   . Glaucoma of both eyes   . History of endometrial cancer    1990 dx  Stage IB, Grade 1 endometrial carcinoma  s/p TAH w/ BSO--  recurrence 1994 vaginal cuff  s/p radiation  . History of radiation therapy    1994  lower pelvic area for recurrent endometrial cancer at vaginal cuff  . Hyperlipidemia   . Hypertension   . OAB (overactive bladder)   . Osteopenia 05/2013   T score -2.1 FRAX 8.9%/2.5%  . Urge urinary incontinence   . Wears glasses     Past Surgical History:  Procedure Laterality Date  . ANAL RECTAL MANOMETRY N/A 03/29/2014   Procedure: ANO RECTAL MANOMETRY;  Surgeon: [name], MD;  Location: [facility];  Service: Endoscopy;  Laterality: N/A;  . ANAL RECTAL MANOMETRY N/A 04/28/2014   Procedure: ANO RECTAL MANOMETRY;  Surgeon: [name], MD;  Location: [facility];  Service: Endoscopy;  Laterality: N/A;  . CATARACT EXTRACTION W/ INTRAOCULAR LENS  IMPLANT, BILATERAL  1990's  . GLAUCOMA SURGERY Bilateral 2010   laser  . PATELLA FRACTURE SURGERY Left 1995  . SACRAL NERVE LEAD IMPLANTATION --  STAGE ONE INTERSTIM  [id number]   dr [name]   fecal incontinence  . STAGE TWO SACRAL NERVE STIMULATOR INTERSTIM  09-15-2014  . TOTAL ABDOMINAL HYSTERECTOMY W/ BILATERAL SALPINGOOPHORECTOMY  1990    There were no vitals filed for this visit.   Subjective Assessment - 02/16/19 1759    Subjective  Dizziness began in July - waited a week and then went to see primary care physician because dizziness continued with nausea and HA.  Was prescribed Meclizine with no improvement.   Had appointment with neurologist who referred her to PT.  She sees a Restaurant manager, fast food every week - chiropractor took xrays and told her that her neck was curved and she needed adjustments to help with dizziness and HA.  Pt reports overall her dizziness has improved; only occurs during sit > stand and standing activities - makes pt feel fatigued and like she needs to sit down.    Pertinent History  urge urinary incontinence, osteopenia, OAB, HTN, HLD, h/o endometrial CA and radiation therapy, glaucoma of both eyes with laser surgery, sacral nerve stimulator, L patella fracture, hearing loss in L ear, h/o BPPV, back pain and OA    Patient Stated Goals  to continue to improve the dizziness    Currently in Pain?  No/denies  [facility] PT Assessment - 02/16/19 1803      Assessment   Medical Diagnosis  Vertigo    Referring Provider (PT)  Larey Seat, MD    Onset Date/Surgical Date  --   reports onset in July 2020   Prior Therapy  no      Precautions   Precautions  Other (comment)    Precaution Comments  urge urinary incontinence, osteopenia, OAB, HTN, HLD, h/o endometrial CA and radiation therapy, glaucoma of both eyes with laser surgery, sacral nerve stimulator, L patella fracture, hearing loss in L ear, h/o BPPV, back pain and OA      Balance Screen   Has the patient fallen in the past 6 months  No    Has the patient had a decrease in activity level because of a fear of falling?   Yes      [place] residence    Living Arrangements  Alone      Prior Function   Level of Independence  Independent      Observation/Other Assessments   Focus on Therapeutic Outcomes (FOTO)   Not performed      Sensation   Light Touch  Appears Intact           Vestibular Assessment - 02/16/19 1804      Vestibular Assessment   General Observation  mild imbalance with gait.       Symptom Behavior   Subjective history of current problem  reports nausea but no vomiting, reports HA when dizzy.  Denies changes in vision or hearing but reports having bilat glaucoma and neurologist stated she had decreased hearing on L side - has not had an formal hearing test with audiology.  Denies feelings of fullness, denies tinnitus.  Denies recent respiratory infections.  Has a history of dizziness.  Denies sensitivity to light, sound or smell.    Type of Dizziness   Imbalance    Frequency of Dizziness  intermittent    Duration of Dizziness  hours and then would feel fatigued    Symptom Nature  Positional   head down   Aggravating Factors  Comment;Sit to stand   head down, standing   Relieving Factors  Lying supine   sitting down   Progression of Symptoms  Better    History of similar episodes  does not feel like vertigo she has experienced before      Oculomotor Exam   Oculomotor Alignment  Normal    Ocular ROM  WFL    Spontaneous  Absent    Gaze-induced   Absent    Smooth Pursuits  Intact    Saccades  Intact    Comment  - test of skew      Oculomotor Exam-Fixation Suppressed    Left Head Impulse  negative    Right Head Impulse  negative      Vestibulo-Ocular Reflex   VOR to Slow Head Movement  Normal    VOR Cancellation  Normal      Positional Testing   Dix-Hallpike  Dix-Hallpike Right;Dix-Hallpike Left    Horizontal Canal Testing  Horizontal Canal Right;Horizontal Canal Left      Dix-Hallpike Right   Dix-Hallpike Right Duration  0    Dix-Hallpike Right Symptoms   No nystagmus      Dix-Hallpike Left   Dix-Hallpike Left Duration  0    Dix-Hallpike Left Symptoms  Other (comment)   lightheaded     Horizontal Canal Right  Horizontal Canal Right Duration  0    Horizontal Canal Right Symptoms  Normal      Horizontal Canal Left   Horizontal Canal Left Duration  0    Horizontal Canal Left Symptoms  Normal      Positional Sensitivities   Sit to Supine  No dizziness    Supine to Left Side  No dizziness    Supine to Right Side  No dizziness    Supine to Sitting  Lightheadedness    Right Hallpike  No dizziness    Up from Right Hallpike  Lightheadedness    Up from Left Hallpike  Lightheadedness    Nose to Right Knee  No dizziness    Right Knee to Sitting  No dizziness    Nose to Left Knee  No dizziness    Left Knee to Sitting  No dizziness    Head Turning x 5  No dizziness    Head Nodding x 5  Lightheadedness    Pivot Right in Standing  No dizziness    Pivot Left in Standing  No dizziness    Rolling Right  No dizziness    Rolling Left  No dizziness          Objective measurements completed on examination: See above findings.              PT Education - 02/17/19 1307    Education Details  clinical findings, PT POC and goals    Person(s) Educated  Patient    Methods  Explanation    Comprehension  Verbalized understanding       PT Short Term Goals - 02/17/19 1319      PT SHORT TERM GOAL #1   Title  Patient will demonstrate independence with initial vestibular and balance HEP    Time  4    Period  Weeks    Status  New    Target Date  03/19/19      PT SHORT TERM GOAL #2   Title  Pt will participate in further assessment of symptoms with orthostatic assessment and FGA    Time  4    Period  Weeks    Status  New    Target Date  03/19/19        PT Long Term Goals - 02/17/19 1321      PT LONG TERM GOAL #1   Title  Pt will be independent with HEP and walking program    Time  8    Period  Weeks    Status  New     Target Date  04/18/19      PT LONG TERM GOAL #2   Title  Pt will report no symptoms when performing supine <> sit, sit <> stand, and when looking from floor <> ceiling    Time  8    Period  Weeks    Status  New    Target Date  04/18/19      PT LONG TERM GOAL #3   Title  If demonstrates orthostatic hypotension - will verbalize safe method for transitioning from supine > sit > stand to minimize risk for syncope    Baseline  TBD    Time  8    Period  Weeks    Status  New    Target Date  04/18/19      PT LONG TERM GOAL #4   Title  Pt will improve FGA by 4 points to indicate decreased risk for  falls    Baseline  TBD    Time  8    Period  Weeks    Status  New    Target Date  04/18/19      PT LONG TERM GOAL #5   Title  Pt will report being able to cook a whole meal in standing without needing to sit down due to symptoms    Time  8    Period  Weeks    Status  New    Target Date  04/18/19          02/17/19 1311  Plan  Clinical Impression Statement Patient is an 85 year old female referred to Neuro OPPT for evaluation of vertigo since July.  Pt's PMH is significant for the following: urge urinary incontinence, osteopenia, OAB, HTN, HLD, h/o endometrial CA and radiation therapy, glaucoma of both eyes with laser surgery, sacral nerve stimulator, L patella fracture, hearing loss in L ear, h/o BPPV, back pain and OA. The following deficits were noted during pt's exam: headache, disequilibrium and feelings of lightheadedness with positional testing and during MSQ - no true reports of vertigo and no nystagmus observed with positional testing; decreased activity tolerance and impaired balance with difficulty walking placing patient at increased risk for falls. Pt would benefit from skilled PT to address these impairments and functional limitations to maximize functional mobility independence and reduce falls risk  Personal Factors and Comorbidities Age;Comorbidity 3+;Social  Background;Past/Current Experience  Comorbidities urge urinary incontinence, osteopenia, OAB, HTN, HLD, h/o endometrial CA and radiation therapy, glaucoma of both eyes with laser surgery, sacral nerve stimulator, L patella fracture, hearing loss in L ear, h/o BPPV, back pain and OA  Examination-Activity Limitations Bend;Locomotion Level;Stand;Stairs  Examination-Participation Restrictions Meal Prep;Cleaning  Pt will benefit from skilled therapeutic intervention in order to improve on the following deficits Decreased activity tolerance;Decreased balance;Difficulty walking;Dizziness;Pain  Stability/Clinical Decision Making Evolving/Moderate complexity  Clinical Decision Making Moderate  Rehab Potential Good  PT Frequency 1x / week  PT Duration 8 weeks  PT Treatment/Interventions ADLs/Self Care Home Management;Canalith Repostioning;Gait training;Functional mobility training;Therapeutic activities;Therapeutic exercise;Balance training;Neuromuscular re-education;Patient/family education;Vestibular  PT Next Visit Plan assess orthostatics.  Assess FGA.  reassess HIT if needed.  Initiate standing balance, standing VOR HEP  Consulted and Agree with Plan of Care Patient          Patient will benefit from skilled therapeutic intervention in order to improve the following deficits and impairments:  Decreased activity tolerance, Decreased balance, Difficulty walking, Dizziness, Pain  Visit Diagnosis: Dizziness and giddiness  Unsteadiness on feet  Difficulty in walking, not elsewhere classified     Problem List Patient Active Problem List   Diagnosis Date Noted  . BPV (benign positional vertigo), left 02/10/2019  . Hearing loss of left ear 02/10/2019  . Nausea 02/10/2019  . Rectocele 09/16/2013   Rico Junker, PT, DPT 02/17/19    1:25 PM    [facility] [address] [place], [place], [address] Phone: [phone]   Fax:   [phone]  Name: Janet Cooper MRN: [id number] Date of Birth: May 08, 1930

## 2019-03-02 ENCOUNTER — Ambulatory Visit
Admission: RE | Admit: 2019-03-02 | Discharge: 2019-03-02 | Disposition: A | Discharge status: Home / Self Care | Payer: Medicare Other | Source: Ambulatory Visit | Attending: Family Medicine | Admitting: Family Medicine

## 2019-03-02 ENCOUNTER — Other Ambulatory Visit: Payer: Self-pay | Admitting: Family Medicine

## 2019-03-02 ENCOUNTER — Other Ambulatory Visit: Payer: Self-pay

## 2019-03-02 DIAGNOSIS — M545 Low back pain, unspecified: Secondary | ICD-10-CM

## 2019-03-03 ENCOUNTER — Encounter

## 2019-03-03 ENCOUNTER — Ambulatory Visit: Payer: Medicare Other | Admitting: Neurology

## 2019-03-09 ENCOUNTER — Other Ambulatory Visit: Payer: Self-pay

## 2019-03-09 ENCOUNTER — Encounter: Payer: Self-pay | Admitting: Physical Therapy

## 2019-03-09 ENCOUNTER — Ambulatory Visit: Payer: Medicare Other | Attending: Neurology | Admitting: Physical Therapy

## 2019-03-09 DIAGNOSIS — R262 Difficulty in walking, not elsewhere classified: Secondary | ICD-10-CM | POA: Diagnosis present

## 2019-03-09 DIAGNOSIS — R2681 Unsteadiness on feet: Secondary | ICD-10-CM

## 2019-03-09 DIAGNOSIS — R42 Dizziness and giddiness: Secondary | ICD-10-CM | POA: Diagnosis present

## 2019-03-09 NOTE — Patient Instructions (Addendum)
Gaze Stabilization: Sitting    Keeping eyes on target on wall 3 feet away, and move head side to side for _30__ seconds. Repeat while moving head up and down for __30_ seconds. Do __2-3__ sessions per day.  Copyright  VHI. All rights reserved.   Gaze Stabilization: Tip Card  1.Target must remain in focus, not blurry, and appear stationary while head is in motion. 2.Perform exercises with small head movements (45 to either side of midline). 3.Increase speed of head motion so long as target is in focus. 4.If you wear eyeglasses, be sure you can see target through lens (therapist will give specific instructions for bifocal / progressive lenses). 5.These exercises may provoke dizziness or nausea. Work through these symptoms. If too dizzy, slow head movement slightly. Rest between each exercise. 6.Exercises demand concentration; avoid distractions.  Copyright  VHI. All rights reserved.     

## 2019-03-10 NOTE — Therapy (Signed)
[facility] [address] [place], [place], [address] Phone: [phone]   Fax:  [phone]  Physical Therapy Treatment  Patient Details  Name: Janet Cooper MRN: [id number] Date of Birth: 02/26/1930 Referring Provider (PT): Larey Seat, MD   Encounter Date: 03/09/2019  PT End of Session - 03/10/19 1135    Visit Number  2    Number of Visits  9    Date for PT Re-Evaluation  04/18/19    Authorization Type  [employer] MEDICARE; $20 Copay.  10th visit PN    PT Start Time  1623    PT Stop Time  1708    PT Time Calculation (min)  45 min    Activity Tolerance  Patient tolerated treatment well    Behavior During Therapy  WFL for tasks assessed/performed       Past Medical History:  Diagnosis Date   Arthritis    back   Chronic cystitis    Detrusor instability of bladder    Fecal incontinence    Glaucoma of both eyes    History of endometrial cancer    1990 dx  Stage IB, Grade 1 endometrial carcinoma  s/p TAH w/ BSO--  recurrence 1994 vaginal cuff  s/p radiation   History of radiation therapy    1994  lower pelvic area for recurrent endometrial cancer at vaginal cuff   Hyperlipidemia    Hypertension    OAB (overactive bladder)    Osteopenia 05/2013   T score -2.1 FRAX 8.9%/2.5%   Urge urinary incontinence    Wears glasses     Past Surgical History:  Procedure Laterality Date   ANAL RECTAL MANOMETRY N/A 03/29/2014   Procedure: ANO RECTAL MANOMETRY;  Surgeon: [name], MD;  Location: [facility];  Service: Endoscopy;  Laterality: N/A;   ANAL RECTAL MANOMETRY N/A 04/28/2014   Procedure: ANO RECTAL MANOMETRY;  Surgeon: [name], MD;  Location: [facility];  Service: Endoscopy;  Laterality: N/A;   CATARACT EXTRACTION W/ INTRAOCULAR LENS  IMPLANT, BILATERAL  1990's   GLAUCOMA SURGERY Bilateral 2010   laser   PATELLA FRACTURE SURGERY Left 1995   SACRAL NERVE LEAD IMPLANTATION --  STAGE ONE INTERSTIM  [id number]   dr [name]   fecal incontinence   STAGE TWO SACRAL NERVE STIMULATOR INTERSTIM  09-15-2014   TOTAL ABDOMINAL HYSTERECTOMY W/ BILATERAL SALPINGOOPHORECTOMY  1990    There were no vitals filed for this visit.  Subjective Assessment - 03/09/19 1629    Subjective  After evaluation pt has been feeling pretty good, yesterday she felt the best she has felt and was able to do some housework.  Still asking about the connection between her neck and dizziness.    Pertinent History  urge urinary incontinence, osteopenia, OAB, HTN, HLD, h/o endometrial CA and radiation therapy, glaucoma of both eyes with laser surgery, sacral nerve stimulator, L patella fracture, hearing loss in L ear, h/o BPPV, back pain and OA    Patient Stated Goals  to continue to improve the dizziness    Currently in Pain?  No/denies             Vestibular Assessment - 03/09/19 1641      Balancemaster   Balancemaster Comment  MCTSIB: 30 seconds for condition 1 and 2 - no dizziness and pt felt fairly stable.  On condition 3 and 4 pt able to perform for 30 seconds with very close supervision but pt felt very unstable and anxious - no  dizziness though      Orthostatics   BP supine (x 5 minutes)  131/82    HR supine (x 5 minutes)  65    BP sitting  131/82    HR sitting  69    BP standing (after 1 minute)  128/82    HR standing (after 1 minute)  79    BP standing (after 3 minutes)  136/90    HR standing (after 3 minutes)  73    Orthostatics Comment  mild dizziness when performing sit > stand                Vestibular Treatment/Exercise - 03/09/19 1653      Vestibular Treatment/Exercise   Vestibular Treatment Provided  Gaze    Gaze Exercises  X1 Viewing Horizontal;X1 Viewing Vertical      X1 Viewing Horizontal   Foot Position  standing feet apart    Reps  2    Comments  unable to perform in standing; returned to sitting - pt continued to have significant difficulty  with coordinating gaze stability on target with head movement for 30 seconds      X1 Viewing Vertical   Foot Position  seated    Reps  1    Comments  significant difficulty coordinating gaze stability with head movement            PT Education - 03/10/19 1152    Education Details  how orthostasis can contribute to dizziness with position changes; no evidence of orthostasis today.  Initiated HEP    Person(s) Educated  Patient    Methods  Explanation;Handout;Demonstration    Comprehension  Verbalized understanding;Returned demonstration;Need further instruction       Gaze Stabilization: Sitting    Keeping eyes on target on wall 3 feet away, and move head side to side for _30___ seconds. Repeat while moving head up and down for __30__ seconds. Do __2-3__ sessions per day.  Copyright  VHI. All rights reserved.   Gaze Stabilization: Tip Card  1.Target must remain in focus, not blurry, and appear stationary while head is in motion. 2.Perform exercises with small head movements (45 to either side of midline). 3.Increase speed of head motion so long as target is in focus. 4.If you wear eyeglasses, be sure you can see target through lens (therapist will give specific instructions for bifocal / progressive lenses). 5.These exercises may provoke dizziness or nausea. Work through these symptoms. If too dizzy, slow head movement slightly. Rest between each exercise. 6.Exercises demand concentration; avoid distractions.  Copyright  VHI. All rights reserved.          PT Short Term Goals - 02/17/19 1319      PT SHORT TERM GOAL #1   Title  Patient will demonstrate independence with initial vestibular and balance HEP    Time  4    Period  Weeks    Status  New    Target Date  03/19/19      PT SHORT TERM GOAL #2   Title  Pt will participate in further assessment of symptoms with orthostatic assessment and FGA    Time  4    Period  Weeks    Status  New    Target Date   03/19/19        PT Long Term Goals - 03/10/19 1146      PT LONG TERM GOAL #1   Title  Pt will be independent with HEP and walking program  Time  8    Period  Weeks    Status  New    Target Date  04/18/19      PT LONG TERM GOAL #2   Title  Pt will report no symptoms when performing supine <> sit, sit <> stand, and when looking from floor <> ceiling    Time  8    Period  Weeks    Status  New    Target Date  04/18/19      PT LONG TERM GOAL #3   Title  If demonstrates orthostatic hypotension - will verbalize safe method for transitioning from supine > sit > stand to minimize risk for syncope    Baseline  no evidence of orthostasis when assessed on 10/5    Status  Deferred      PT LONG TERM GOAL #4   Title  Pt will demonstrate improved use of multiple sensory input for balance as indicated by ability to perform 30 seconds on each condition of MCTSIB with supervision    Baseline  30 seconds for condition 1/2 with minimal sway; conditions 3 and 4 pt required min A to complete 30 seconds    Time  8    Period  Weeks    Status  Revised    Target Date  04/18/19      PT LONG TERM GOAL #5   Title  Pt will report being able to cook a whole meal in standing without needing to sit down due to symptoms    Time  8    Period  Weeks    Status  New    Target Date  04/18/19            Plan - 03/10/19 1137    Clinical Impression Statement  Pt continues to report overall improvment in initial symptoms.  Continues to have some lightheadedness when standing up.  Performed assessment of orthostatics with no significant drop in BP with positional changes.  Performed MCTSIB to assess visual, sensory and vestibular input for balance - pt with greater balance deficits on compliant surface and with eyes closed but did not report her sensation of dizziness.  Only lightheadedness reported during supine > sit.  Initiated gaze stabilization training and will continue to incorporate habituation and  balance training into therapy sessions.    Personal Factors and Comorbidities  Age;Comorbidity 3+;Social Background;Past/Current Experience    Comorbidities  urge urinary incontinence, osteopenia, OAB, HTN, HLD, h/o endometrial CA and radiation therapy, glaucoma of both eyes with laser surgery, sacral nerve stimulator, L patella fracture, hearing loss in L ear, h/o BPPV, back pain and OA    Examination-Activity Limitations  Bend;Locomotion Level;Stand;Stairs    Examination-Participation Restrictions  Meal Prep;Cleaning    Stability/Clinical Decision Making  Evolving/Moderate complexity    Rehab Potential  Good    PT Frequency  1x / week    PT Duration  8 weeks    PT Treatment/Interventions  ADLs/Self Care Home Management;Canalith Repostioning;Gait training;Functional mobility training;Therapeutic activities;Therapeutic exercise;Balance training;Neuromuscular re-education;Patient/family education;Vestibular    PT Next Visit Plan  Add to HEP: progress VOR, Initiate standing balance, habituation    Consulted and Agree with Plan of Care  Patient       Patient will benefit from skilled therapeutic intervention in order to improve the following deficits and impairments:  Decreased activity tolerance, Decreased balance, Difficulty walking, Dizziness, Pain  Visit Diagnosis: Dizziness and giddiness  Unsteadiness on feet  Difficulty in walking, not elsewhere classified  Problem List Patient Active Problem List   Diagnosis Date Noted   BPV (benign positional vertigo), left 02/10/2019   Hearing loss of left ear 02/10/2019   Nausea 02/10/2019   Rectocele 09/16/2013    Rico Junker, PT, DPT 03/10/19    11:53 AM    [facility] [address] [place], [place], [address] Phone: [phone]   Fax:  [phone]  Name: Janet Cooper MRN: [id number] Date of Birth: 02-03-1930

## 2019-03-16 ENCOUNTER — Ambulatory Visit: Payer: Medicare Other | Admitting: Physical Therapy

## 2019-03-23 ENCOUNTER — Ambulatory Visit: Payer: Medicare Other | Admitting: Physical Therapy

## 2019-03-23 ENCOUNTER — Encounter: Payer: Self-pay | Admitting: Physical Therapy

## 2019-03-23 ENCOUNTER — Other Ambulatory Visit: Payer: Self-pay

## 2019-03-23 DIAGNOSIS — R42 Dizziness and giddiness: Secondary | ICD-10-CM

## 2019-03-23 DIAGNOSIS — R262 Difficulty in walking, not elsewhere classified: Secondary | ICD-10-CM

## 2019-03-23 DIAGNOSIS — R2681 Unsteadiness on feet: Secondary | ICD-10-CM

## 2019-03-23 NOTE — Patient Instructions (Addendum)
Gaze Stabilization: Sitting    Keeping eyes on target on wall 3 feet away, and move head side to side for _60___ seconds. Repeat while moving head up and down for __60__ seconds. Do __2-3__ sessions per day.  Copyright  VHI. All rights reserved.   Gaze Stabilization: Tip Card  1.Target must remain in focus, not blurry, and appear stationary while head is in motion. 2.Perform exercises with small head movements (45 to either side of midline). 3.Increase speed of head motion so long as target is in focus. 4.If you wear eyeglasses, be sure you can see target through lens (therapist will give specific instructions for bifocal / progressive lenses). 5.These exercises may provoke dizziness or nausea. Work through these symptoms. If too dizzy, slow head movement slightly. Rest between each exercise. 6.Exercises demand concentration; avoid distractions.  Copyright  VHI. All rights reserved.      SIT TO STAND: Feet TOGETHER.  EYES OPEN    Place feet together. Lean chest forward. Raise hips and straighten knees to stand. _8-10__ reps     Sit to Stand: EYES CLOSED, FEET APART- CHAIR IN FRONT OF YOU FOR SAFETY   With feet apart - perform sit to stand with eyes closed, hand on the back of a chair if needed.  Sit back down slowly with eyes closed. Repeat 8 times.    Copyright  VHI. All rights reserved.

## 2019-03-23 NOTE — Therapy (Signed)
[facility] [address] [place], [place], [address] Phone: [phone]   Fax:  [phone]  Physical Therapy Treatment  Patient Details  Name: Janet Cooper MRN: [id number] Date of Birth: September 06, 1929 Referring Provider (PT): Larey Seat, MD   Encounter Date: 03/23/2019  PT End of Session - 03/23/19 1533    Visit Number  3    Number of Visits  9    Date for PT Re-Evaluation  04/18/19    Authorization Type  [employer] MEDICARE; $20 Copay.  10th visit PN    PT Start Time  1450    PT Stop Time  1532    PT Time Calculation (min)  42 min    Activity Tolerance  Patient tolerated treatment well    Behavior During Therapy  WFL for tasks assessed/performed       Past Medical History:  Diagnosis Date  . Arthritis    back  . Chronic cystitis   . Detrusor instability of bladder   . Fecal incontinence   . Glaucoma of both eyes   . History of endometrial cancer    1990 dx  Stage IB, Grade 1 endometrial carcinoma  s/p TAH w/ BSO--  recurrence 1994 vaginal cuff  s/p radiation  . History of radiation therapy    1994  lower pelvic area for recurrent endometrial cancer at vaginal cuff  . Hyperlipidemia   . Hypertension   . OAB (overactive bladder)   . Osteopenia 05/2013   T score -2.1 FRAX 8.9%/2.5%  . Urge urinary incontinence   . Wears glasses     Past Surgical History:  Procedure Laterality Date  . ANAL RECTAL MANOMETRY N/A 03/29/2014   Procedure: ANO RECTAL MANOMETRY;  Surgeon: [name], MD;  Location: [facility];  Service: Endoscopy;  Laterality: N/A;  . ANAL RECTAL MANOMETRY N/A 04/28/2014   Procedure: ANO RECTAL MANOMETRY;  Surgeon: [name], MD;  Location: [facility];  Service: Endoscopy;  Laterality: N/A;  . CATARACT EXTRACTION W/ INTRAOCULAR LENS  IMPLANT, BILATERAL  1990's  . GLAUCOMA SURGERY Bilateral 2010   laser  . PATELLA FRACTURE SURGERY Left 1995  . SACRAL NERVE LEAD IMPLANTATION --  STAGE ONE INTERSTIM  [id number]   dr [name]   fecal incontinence  . STAGE TWO SACRAL NERVE STIMULATOR INTERSTIM  09-15-2014  . TOTAL ABDOMINAL HYSTERECTOMY W/ BILATERAL SALPINGOOPHORECTOMY  1990    There were no vitals filed for this visit.  Subjective Assessment - 03/23/19 1455    Subjective  Pt reports having a UTI last week and having to cancel sessions.  Having a dental procedure this week.  Has been trying the exercise; feels some dizziness when turning head to R.  Some mild dizziness when moving too fast.    Pertinent History  urge urinary incontinence, osteopenia, OAB, HTN, HLD, h/o endometrial CA and radiation therapy, glaucoma of both eyes with laser surgery, sacral nerve stimulator, L patella fracture, hearing loss in L ear, h/o BPPV, back pain and OA    Patient Stated Goals  to continue to improve the dizziness    Currently in Pain?  No/denies                        Vestibular Treatment/Exercise - 03/23/19 1457      Vestibular Treatment/Exercise   Vestibular Treatment Provided  Gaze    Gaze Exercises  X1 Viewing Horizontal;X1 Viewing Vertical      X1 Viewing Horizontal  Foot Position  seated    Reps  3    Comments  30 > 60 seconds with therapist providing cues for sequencing head movement ROM and speed of movement.  Third repetitions attempted to perform slightly faster      X1 Viewing Vertical   Foot Position  seated    Reps  3    Comments  30 > 60 seconds with therapist providing cues for sequencing head movement ROM and speed of movement.  Third repetitions attempted to perform slightly faster        Gaze Stabilization: Sitting    Keeping eyes on target on wall 3 feet away, and move head side to side for _60___ seconds. Repeat while moving head up and down for __60__ seconds. Do __2-3__ sessions per day.  Copyright  VHI. All rights reserved.   Gaze Stabilization: Tip Card  1.Target must remain in focus, not blurry, and appear  stationary while head is in motion. 2.Perform exercises with small head movements (45 to either side of midline). 3.Increase speed of head motion so long as target is in focus. 4.If you wear eyeglasses, be sure you can see target through lens (therapist will give specific instructions for bifocal / progressive lenses). 5.These exercises may provoke dizziness or nausea. Work through these symptoms. If too dizzy, slow head movement slightly. Rest between each exercise. 6.Exercises demand concentration; avoid distractions.  Copyright  VHI. All rights reserved.      SIT TO STAND: Feet TOGETHER.  EYES OPEN    Place feet together. Lean chest forward. Raise hips and straighten knees to stand. _8-10__ reps     Sit to Stand: EYES CLOSED, FEET APART- CHAIR IN FRONT OF YOU FOR SAFETY   With feet apart - perform sit to stand with eyes closed, hand on the back of a chair if needed.  Sit back down slowly with eyes closed. Repeat 8 times.    Copyright  VHI. All rights reserved.        PT Education - 03/23/19 1532    Education Details  updated HEP, added more visits, connection between eyes/vision and inner ear (VOR)    Person(s) Educated  Patient    Methods  Explanation;Demonstration;Handout    Comprehension  Verbalized understanding;Returned demonstration       PT Short Term Goals - 03/23/19 1650      PT SHORT TERM GOAL #1   Title  Patient will demonstrate independence with initial vestibular and balance HEP    Time  4    Period  Weeks    Status  Achieved    Target Date  03/19/19      PT SHORT TERM GOAL #2   Title  Pt will participate in further assessment of symptoms with orthostatic assessment and FGA    Time  4    Period  Weeks    Status  Achieved    Target Date  03/19/19        PT Long Term Goals - 03/10/19 1146      PT LONG TERM GOAL #1   Title  Pt will be independent with HEP and walking program    Time  8    Period  Weeks    Status  New    Target Date   04/18/19      PT LONG TERM GOAL #2   Title  Pt will report no symptoms when performing supine <> sit, sit <> stand, and when looking from floor <> ceiling  Time  8    Period  Weeks    Status  New    Target Date  04/18/19      PT LONG TERM GOAL #3   Title  If demonstrates orthostatic hypotension - will verbalize safe method for transitioning from supine > sit > stand to minimize risk for syncope    Baseline  no evidence of orthostasis when assessed on 10/5    Status  Deferred      PT LONG TERM GOAL #4   Title  Pt will demonstrate improved use of multiple sensory input for balance as indicated by ability to perform 30 seconds on each condition of MCTSIB with supervision    Baseline  30 seconds for condition 1/2 with minimal sway; conditions 3 and 4 pt required min A to complete 30 seconds    Time  8    Period  Weeks    Status  Revised    Target Date  04/18/19      PT LONG TERM GOAL #5   Title  Pt will report being able to cook a whole meal in standing without needing to sit down due to symptoms    Time  8    Period  Weeks    Status  New    Target Date  04/18/19            Plan - 03/23/19 1648    Clinical Impression Statement  Pt continues to demonstrate improvement in dizziness - able to progress x1 viewing to 60 seconds but did not progress to standing due to ongoing need for cues to perform with correct sequence/technique and mild symptoms in sitting.  Will attempt to progress to standing at next visit.  Added sit <> stand to HEP focusing on narrow BOS and decreased use of vision.  Will continue to address vestibular and balance impairments to continue to progress towards LTG.    Personal Factors and Comorbidities  Age;Comorbidity 3+;Social Background;Past/Current Experience    Comorbidities  urge urinary incontinence, osteopenia, OAB, HTN, HLD, h/o endometrial CA and radiation therapy, glaucoma of both eyes with laser surgery, sacral nerve stimulator, L patella fracture,  hearing loss in L ear, h/o BPPV, back pain and OA    Examination-Activity Limitations  Bend;Locomotion Level;Stand;Stairs    Examination-Participation Restrictions  Meal Prep;Cleaning    Stability/Clinical Decision Making  Evolving/Moderate complexity    Rehab Potential  Good    PT Frequency  1x / week    PT Duration  8 weeks    PT Treatment/Interventions  ADLs/Self Care Home Management;Canalith Repostioning;Gait training;Functional mobility training;Therapeutic activities;Therapeutic exercise;Balance training;Neuromuscular re-education;Patient/family education;Vestibular    PT Next Visit Plan  Add to HEP: progress VOR to standing and sit <> stand to compliant surface; Initiate standing balance in corner, gait with head turns    Consulted and Agree with Plan of Care  Patient       Patient will benefit from skilled therapeutic intervention in order to improve the following deficits and impairments:  Decreased activity tolerance, Decreased balance, Difficulty walking, Dizziness, Pain  Visit Diagnosis: Dizziness and giddiness  Unsteadiness on feet  Difficulty in walking, not elsewhere classified     Problem List Patient Active Problem List   Diagnosis Date Noted  . BPV (benign positional vertigo), left 02/10/2019  . Hearing loss of left ear 02/10/2019  . Nausea 02/10/2019  . Rectocele 09/16/2013    Rico Junker, PT, DPT 03/23/19    4:52 PM    [facility] [address] [place], [place], [address] Phone: [phone]   Fax:  [phone]  Name: ANALEY LATHEN MRN: [id number] Date of Birth: 07-23-1929

## 2019-03-30 ENCOUNTER — Other Ambulatory Visit: Payer: Self-pay

## 2019-03-30 ENCOUNTER — Ambulatory Visit: Payer: Medicare Other | Admitting: Physical Therapy

## 2019-03-30 ENCOUNTER — Encounter: Payer: Self-pay | Admitting: Physical Therapy

## 2019-03-30 DIAGNOSIS — R2681 Unsteadiness on feet: Secondary | ICD-10-CM

## 2019-03-30 DIAGNOSIS — R262 Difficulty in walking, not elsewhere classified: Secondary | ICD-10-CM

## 2019-03-30 DIAGNOSIS — R42 Dizziness and giddiness: Secondary | ICD-10-CM

## 2019-03-30 NOTE — Patient Instructions (Addendum)
Gaze Stabilization - Tip Card  1.Target must remain in focus, not blurry, and appear stationary while head is in motion. 2.Perform exercises with small head movements (45 to either side of midline). 3.Increase speed of head motion so long as target is in focus. 4.If you wear eyeglasses, be sure you can see target through lens (therapist will give specific instructions for bifocal / progressive lenses). 5.These exercises may provoke dizziness or nausea. Work through these symptoms. If too dizzy, slow head movement slightly. Rest between each exercise. 6.Exercises demand concentration; avoid distractions. 7.For safety, perform standing exercises close to a counter, wall, corner, or next to someone.  Copyright  VHI. All rights reserved.   Gaze Stabilization - Standing Feet Apart   Feet shoulder width apart, keeping eyes on target on wall 3 feet away, tilt head down slightly and move head side to side for 60 seconds. Repeat while moving head up and down for 60 seconds.  Do 2-3 sessions per day.   SIT TO STAND: Feet TOGETHER.  EYES OPEN    Place feet together. Lean chest forward. Raise hips and straighten knees to stand. _8-10__ reps     Sit to Stand: EYES CLOSED, FEET APART- CHAIR IN FRONT OF YOU FOR SAFETY   With feet apart - perform sit to stand with eyes closed, hand on the back of a chair if needed.  Sit back down slowly with eyes closed. Repeat 8 times.

## 2019-03-30 NOTE — Therapy (Signed)
[facility] [address] [place], [place], [address] Phone: [phone]   Fax:  [phone]  Physical Therapy Treatment  Patient Details  Name: Janet Cooper MRN: [id number] Date of Birth: 1929/12/04 Referring Provider (PT): Larey Seat, MD   Encounter Date: 03/30/2019  PT End of Session - 03/30/19 1528    Visit Number  4    Number of Visits  9    Date for PT Re-Evaluation  04/18/19    Authorization Type  [employer] MEDICARE; $20 Copay.  10th visit PN    PT Start Time  1448    PT Stop Time  1527    PT Time Calculation (min)  39 min    Activity Tolerance  Patient tolerated treatment well    Behavior During Therapy  WFL for tasks assessed/performed       Past Medical History:  Diagnosis Date  . Arthritis    back  . Chronic cystitis   . Detrusor instability of bladder   . Fecal incontinence   . Glaucoma of both eyes   . History of endometrial cancer    1990 dx  Stage IB, Grade 1 endometrial carcinoma  s/p TAH w/ BSO--  recurrence 1994 vaginal cuff  s/p radiation  . History of radiation therapy    1994  lower pelvic area for recurrent endometrial cancer at vaginal cuff  . Hyperlipidemia   . Hypertension   . OAB (overactive bladder)   . Osteopenia 05/2013   T score -2.1 FRAX 8.9%/2.5%  . Urge urinary incontinence   . Wears glasses     Past Surgical History:  Procedure Laterality Date  . ANAL RECTAL MANOMETRY N/A 03/29/2014   Procedure: ANO RECTAL MANOMETRY;  Surgeon: [name], MD;  Location: [facility];  Service: Endoscopy;  Laterality: N/A;  . ANAL RECTAL MANOMETRY N/A 04/28/2014   Procedure: ANO RECTAL MANOMETRY;  Surgeon: [name], MD;  Location: [facility];  Service: Endoscopy;  Laterality: N/A;  . CATARACT EXTRACTION W/ INTRAOCULAR LENS  IMPLANT, BILATERAL  1990's  . GLAUCOMA SURGERY Bilateral 2010   laser  . PATELLA FRACTURE SURGERY Left 1995  . SACRAL NERVE LEAD IMPLANTATION --  STAGE ONE INTERSTIM  [id number]   dr [name]   fecal incontinence  . STAGE TWO SACRAL NERVE STIMULATOR INTERSTIM  09-15-2014  . TOTAL ABDOMINAL HYSTERECTOMY W/ BILATERAL SALPINGOOPHORECTOMY  1990    There were no vitals filed for this visit.  Subjective Assessment - 03/30/19 1451    Subjective  Pt reports feelings of lightheadedness/dizziness when bending down to tie shoes or to pick up something off the floor.  Exercise is going well but it does not bring on symptoms in sitting.    Pertinent History  urge urinary incontinence, osteopenia, OAB, HTN, HLD, h/o endometrial CA and radiation therapy, glaucoma of both eyes with laser surgery, sacral nerve stimulator, L patella fracture, hearing loss in L ear, h/o BPPV, back pain and OA    Patient Stated Goals  to continue to improve the dizziness                        Vestibular Treatment/Exercise - 03/30/19 1452      Vestibular Treatment/Exercise   Vestibular Treatment Provided  Gaze;Habituation    Habituation Exercises  Comment    Gaze Exercises  X1 Viewing Horizontal;X1 Viewing Vertical      360 degree Turns   COMMENT  Performed seated and then standing bending down  to the ground to retrieve 3 and then 5 cones.  Performed 2 sets x 5 reps of retrieving and then putting cones back down on floor.  During standing added in R and L upper body/head turns over L and R shoulder to hand or retrieve cone from therapist to add in rotation. Pt reported mild dizziness when rotation added in      X1 Viewing Horizontal   Foot Position  standing feet apart    Reps  3    Comments  30 seconds with verbal cues for sequencing; progressed to 60 seconds      X1 Viewing Vertical   Foot Position  standing feet apart    Reps  3    Comments  30 seconds with verbal cues for sequencing; progressed to 60 seconds.  Mild symptoms after second repetition.  Cues to slightly increase speed of head movement            PT Education -  03/30/19 1527    Education Details  progressed x1 viewing.  Began to add in reaching to floor with rotation for habituation.  Role of vision in balance and why exercise with vision removed further increases balance challenge.    Person(s) Educated  Patient    Methods  Explanation;Demonstration;Handout    Comprehension  Verbalized understanding;Returned demonstration       PT Short Term Goals - 03/23/19 1650      PT SHORT TERM GOAL #1   Title  Patient will demonstrate independence with initial vestibular and balance HEP    Time  4    Period  Weeks    Status  Achieved    Target Date  03/19/19      PT SHORT TERM GOAL #2   Title  Pt will participate in further assessment of symptoms with orthostatic assessment and FGA    Time  4    Period  Weeks    Status  Achieved    Target Date  03/19/19        PT Long Term Goals - 03/10/19 1146      PT LONG TERM GOAL #1   Title  Pt will be independent with HEP and walking program    Time  8    Period  Weeks    Status  New    Target Date  04/18/19      PT LONG TERM GOAL #2   Title  Pt will report no symptoms when performing supine <> sit, sit <> stand, and when looking from floor <> ceiling    Time  8    Period  Weeks    Status  New    Target Date  04/18/19      PT LONG TERM GOAL #3   Title  If demonstrates orthostatic hypotension - will verbalize safe method for transitioning from supine > sit > stand to minimize risk for syncope    Baseline  no evidence of orthostasis when assessed on 10/5    Status  Deferred      PT LONG TERM GOAL #4   Title  Pt will demonstrate improved use of multiple sensory input for balance as indicated by ability to perform 30 seconds on each condition of MCTSIB with supervision    Baseline  30 seconds for condition 1/2 with minimal sway; conditions 3 and 4 pt required min A to complete 30 seconds    Time  8    Period  Weeks    Status  Revised    Target Date  04/18/19      PT LONG TERM GOAL #5   Title   Pt will report being able to cook a whole meal in standing without needing to sit down due to symptoms    Time  8    Period  Weeks    Status  New    Target Date  04/18/19            Plan - 03/31/19 1426    Clinical Impression Statement  Pt able to progress to standing for x1 viewing today but continued to require cues for sequencing.  Had greater difficulty maintaining gaze stabilization with horizontal head movements but reported mild symptoms of dizziness with vertical head movements.  Contiued to focus on habituation with bending down to the floor first in seated > standing and then added in rotation.  No symptoms reported until rotation was added.  Pt tolerated well; will continue to address in order to progress towards LTG.    Personal Factors and Comorbidities  Age;Comorbidity 3+;Social Background;Past/Current Experience    Comorbidities  urge urinary incontinence, osteopenia, OAB, HTN, HLD, h/o endometrial CA and radiation therapy, glaucoma of both eyes with laser surgery, sacral nerve stimulator, L patella fracture, hearing loss in L ear, h/o BPPV, back pain and OA    Examination-Activity Limitations  Bend;Locomotion Level;Stand;Stairs    Examination-Participation Restrictions  Meal Prep;Cleaning    Stability/Clinical Decision Making  Evolving/Moderate complexity    Rehab Potential  Good    PT Frequency  1x / week    PT Duration  8 weeks    PT Treatment/Interventions  ADLs/Self Care Home Management;Canalith Repostioning;Gait training;Functional mobility training;Therapeutic activities;Therapeutic exercise;Balance training;Neuromuscular re-education;Patient/family education;Vestibular    PT Next Visit Plan  progress VOR to more narrow BOS; bending down to the floor for items with rotation; and sit <> stand to compliant surface; Initiate standing balance in corner, gait with head turns    Consulted and Agree with Plan of Care  Patient       Patient will benefit from skilled  therapeutic intervention in order to improve the following deficits and impairments:  Decreased activity tolerance, Decreased balance, Difficulty walking, Dizziness, Pain  Visit Diagnosis: Dizziness and giddiness  Unsteadiness on feet  Difficulty in walking, not elsewhere classified     Problem List Patient Active Problem List   Diagnosis Date Noted  . BPV (benign positional vertigo), left 02/10/2019  . Hearing loss of left ear 02/10/2019  . Nausea 02/10/2019  . Rectocele 09/16/2013    Rico Junker, PT, DPT 03/31/19    2:31 PM    [facility] [address] [place], [place], [address] Phone: [phone]   Fax:  [phone]  Name: Janet Cooper MRN: [id number] Date of Birth: 12/25/1929

## 2019-04-06 ENCOUNTER — Other Ambulatory Visit: Payer: Self-pay

## 2019-04-06 ENCOUNTER — Ambulatory Visit: Payer: Medicare Other | Attending: Neurology

## 2019-04-06 DIAGNOSIS — R2681 Unsteadiness on feet: Secondary | ICD-10-CM | POA: Diagnosis present

## 2019-04-06 DIAGNOSIS — R262 Difficulty in walking, not elsewhere classified: Secondary | ICD-10-CM | POA: Diagnosis present

## 2019-04-06 DIAGNOSIS — R42 Dizziness and giddiness: Secondary | ICD-10-CM | POA: Diagnosis not present

## 2019-04-06 NOTE — Therapy (Addendum)
[facility] [address] [place], [place], [address] Phone: [phone]   Fax:  [phone]  Physical Therapy Treatment  Patient Details  Name: Janet Cooper MRN: [id number] Date of Birth: November 25, 1929 Referring Provider (PT): Larey Seat, MD   Encounter Date: 04/06/2019  PT End of Session - 04/06/19 1413    Visit Number  5    Number of Visits  9    Date for PT Re-Evaluation  04/18/19    Authorization Type  [employer] MEDICARE; $20 Copay.  10th visit PN    PT Start Time  1318    PT Stop Time  1359    PT Time Calculation (min)  41 min    Equipment Utilized During Treatment  Other (comment)   min guard to S prn   Activity Tolerance  Patient tolerated treatment well    Behavior During Therapy  WFL for tasks assessed/performed       Past Medical History:  Diagnosis Date  . Arthritis    back  . Chronic cystitis   . Detrusor instability of bladder   . Fecal incontinence   . Glaucoma of both eyes   . History of endometrial cancer    1990 dx  Stage IB, Grade 1 endometrial carcinoma  s/p TAH w/ BSO--  recurrence 1994 vaginal cuff  s/p radiation  . History of radiation therapy    1994  lower pelvic area for recurrent endometrial cancer at vaginal cuff  . Hyperlipidemia   . Hypertension   . OAB (overactive bladder)   . Osteopenia 05/2013   T score -2.1 FRAX 8.9%/2.5%  . Urge urinary incontinence   . Wears glasses     Past Surgical History:  Procedure Laterality Date  . ANAL RECTAL MANOMETRY N/A 03/29/2014   Procedure: ANO RECTAL MANOMETRY;  Surgeon: [name], MD;  Location: [facility];  Service: Endoscopy;  Laterality: N/A;  . ANAL RECTAL MANOMETRY N/A 04/28/2014   Procedure: ANO RECTAL MANOMETRY;  Surgeon: [name], MD;  Location: [facility];  Service: Endoscopy;  Laterality: N/A;  . CATARACT EXTRACTION W/ INTRAOCULAR LENS  IMPLANT, BILATERAL  1990's  . GLAUCOMA SURGERY Bilateral 2010   laser   . PATELLA FRACTURE SURGERY Left 1995  . SACRAL NERVE LEAD IMPLANTATION -- STAGE ONE INTERSTIM  [id number]   dr [name]   fecal incontinence  . STAGE TWO SACRAL NERVE STIMULATOR INTERSTIM  09-15-2014  . TOTAL ABDOMINAL HYSTERECTOMY W/ BILATERAL SALPINGOOPHORECTOMY  1990    There were no vitals filed for this visit.  Subjective Assessment - 04/06/19 1320    Subjective  Pt denied falls or changes since last visit. Pt states she's doing much better with lightheadedness/dizziness. Pt stated she's been taking medication for back pain for about one month, and it doesn't seem to be helping. Pt stated she's still sad about her daughter passed away almost a year ago from cancer. Pt reported 2/10 dizziness 1-2 times during VOR at home.    Pertinent History  urge urinary incontinence, osteopenia, OAB, HTN, HLD, h/o endometrial CA and radiation therapy, glaucoma of both eyes with laser surgery, sacral nerve stimulator, L patella fracture, hearing loss in L ear, h/o BPPV, back pain and OA    Patient Stated Goals  to continue to improve the dizziness    Currently in Pain?  No/denies   pt reported hx of arthritis back pain and L shoulder pain       Neuro re-ed: reviewed and progressed HEP as  tolerated. x1 viewing performed with chair behind pt for safety and S.  Gaze Stabilization - Tip Card  1.Target must remain in focus, not blurry, and appear stationary while head is in motion. 2.Perform exercises with small head movements (45 to either side of midline). 3.Increase speed of head motion so long as target is in focus. 4.If you wear eyeglasses, be sure you can see target through lens (therapist will give specific instructions for bifocal / progressive lenses). 5.These exercises may provoke dizziness or nausea. Work through these symptoms. If too dizzy, slow head movement slightly. Rest between each exercise. 6.Exercises demand concentration; avoid distractions. 7.For safety, perform standing  exercises close to a counter, wall, corner, or next to someone.  Copyright  VHI. All rights reserved.   Gaze Stabilization - Standing Feet Apart   Feet shoulder width apart, keeping eyes on target on wall 3 feet away, tilt head down slightly and move head side to side for 60 seconds. Repeat while moving head up and down for 60 seconds.  Do 2-3 sessions per day.  Gaze Stabilization: Standing Feet Together    Feet together,keeping eyes on target on wall 3 feet away, tilt head down slightly and move head side to side for 60 seconds. Repeat while moving head up and down for 60 seconds.  Do 2-3 sessions per day.Feet Together, Head Motion - Eyes Closed    With eyes closed and feet together, move head slowly, up and down 10 times and side to side 10 times. Repeat _3___ times per session. Do _1___ sessions per day.  Copyright  VHI. All rights reserved.   Copyright  VHI. All rights reserved.                  Vestibular Treatment/Exercise - 04/06/19 1324      Vestibular Treatment/Exercise   Vestibular Treatment Provided  Gaze    Gaze Exercises  X1 Viewing Horizontal;X1 Viewing Vertical      X1 Viewing Horizontal   Foot Position  Feet apart and together    Time  --   60 sec.   Reps  2    Comments  Proper technique with feet apart and no postural sway, then trialed feet together.      X1 Viewing Vertical   Foot Position  Feet apart and toegther    Time  --   60 sec.    Reps  2    Comments  Proper technique with feet apart and no postural sway, then trialed feet together.         Balance Exercises - 04/06/19 1411      Balance Exercises: Standing   Standing Eyes Opened  Narrow base of support (BOS);Wide (BOA);Solid surface;Head turns;3 reps;Other reps (comment);30 secs   10 reps   Standing Eyes Closed  Narrow base of support (BOS);Wide (BOA);Head turns;Solid surface;3 reps;Other reps (comment)   10 reps   Other Standing Exercises  Performed in corner  with chair in front of pt for safety. Cues and demo for proper technique. No overt LOB episodes noted, but incr. postural sway duruing eyes closed with head turns/nods. Please see pt instructions for HEP details.  Pt also performed sit<>stand txfs with feet apart on airex pad x10 reps, with cues to improve ant. weight shifting forward with no UE support (with S for safety).        Self Care: PT Education - 04/06/19 1412    Education Details  PT reviewed and progressed x1 viewing in standing  to feet together vs. apart. Added corner balance activities. PT again educated pt on the role of vision and vestibular systems in balance. PT reiterated the importance of continuing to speak with therapist (with Hospice?) regarding the loss of her daughter, as pt expressed she is still very saddened by her daughter's passing away approx. one year ago. Pt reported MD is aware of her sadness.    Person(s) Educated  Patient    Methods  Explanation;Demonstration;Verbal cues;Handout    Comprehension  Returned demonstration;Verbalized understanding       PT Short Term Goals - 03/23/19 1650      PT SHORT TERM GOAL #1   Title  Patient will demonstrate independence with initial vestibular and balance HEP    Time  4    Period  Weeks    Status  Achieved    Target Date  03/19/19      PT SHORT TERM GOAL #2   Title  Pt will participate in further assessment of symptoms with orthostatic assessment and FGA    Time  4    Period  Weeks    Status  Achieved    Target Date  03/19/19        PT Long Term Goals - 03/10/19 1146      PT LONG TERM GOAL #1   Title  Pt will be independent with HEP and walking program    Time  8    Period  Weeks    Status  New    Target Date  04/18/19      PT LONG TERM GOAL #2   Title  Pt will report no symptoms when performing supine <> sit, sit <> stand, and when looking from floor <> ceiling    Time  8    Period  Weeks    Status  New    Target Date  04/18/19      PT LONG TERM  GOAL #3   Title  If demonstrates orthostatic hypotension - will verbalize safe method for transitioning from supine > sit > stand to minimize risk for syncope    Baseline  no evidence of orthostasis when assessed on 10/5    Status  Deferred      PT LONG TERM GOAL #4   Title  Pt will demonstrate improved use of multiple sensory input for balance as indicated by ability to perform 30 seconds on each condition of MCTSIB with supervision    Baseline  30 seconds for condition 1/2 with minimal sway; conditions 3 and 4 pt required min A to complete 30 seconds    Time  8    Period  Weeks    Status  Revised    Target Date  04/18/19      PT LONG TERM GOAL #5   Title  Pt will report being able to cook a whole meal in standing without needing to sit down due to symptoms    Time  8    Period  Weeks    Status  New    Target Date  04/18/19            Plan - 04/06/19 1415    Clinical Impression Statement  Pt continues to demonstrate progress as she tolerated progression from feet apart to feet together during x1 viewing. PT also added corner balance activities to pt's HEP to improve balance, as pt noted to experience incr. postural sway during activities which require incr. vestibular input. Pt denied dizziness during session  today. Pt would continue to benefit from skilled PT to improve safety during functional moility.    Personal Factors and Comorbidities  Age;Comorbidity 3+;Social Background;Past/Current Experience    Comorbidities  urge urinary incontinence, osteopenia, OAB, HTN, HLD, h/o endometrial CA and radiation therapy, glaucoma of both eyes with laser surgery, sacral nerve stimulator, L patella fracture, hearing loss in L ear, h/o BPPV, back pain and OA    Examination-Activity Limitations  Bend;Locomotion Level;Stand;Stairs    Examination-Participation Restrictions  Meal Prep;Cleaning    Stability/Clinical Decision Making  Evolving/Moderate complexity    Rehab Potential  Good    PT  Frequency  1x / week    PT Duration  8 weeks    PT Treatment/Interventions  ADLs/Self Care Home Management;Canalith Repostioning;Gait training;Functional mobility training;Therapeutic activities;Therapeutic exercise;Balance training;Neuromuscular re-education;Patient/family education;Vestibular    PT Next Visit Plan  F/u with pt regarding her feeling especially sad today about daughter's passing a year ago. progress VOR to more narrow BOS with compliant surface?; bending down to the floor for items with rotation; and sit <> stand to compliant surface; Initiate standing balance in corner (review as needed), gait with head turns    Consulted and Agree with Plan of Care  Patient       Patient will benefit from skilled therapeutic intervention in order to improve the following deficits and impairments:  Decreased activity tolerance, Decreased balance, Difficulty walking, Dizziness, Pain  Visit Diagnosis: Dizziness and giddiness  Unsteadiness on feet     Problem List Patient Active Problem List   Diagnosis Date Noted  . BPV (benign positional vertigo), left 02/10/2019  . Hearing loss of left ear 02/10/2019  . Nausea 02/10/2019  . Rectocele 09/16/2013    Danicia Terhaar L 04/06/2019, 2:19 PM  [facility] [address] [place], [place], [address] Phone: [phone]   Fax:  [phone]  Name: Janet Cooper MRN: [id number] Date of Birth: [birth year]  Geoffry Paradise, PT,DPT 04/06/19 2:20 PM Phone: [phone] Fax: [phone]

## 2019-04-06 NOTE — Patient Instructions (Addendum)
Gaze Stabilization - Tip Card  1.Target must remain in focus, not blurry, and appear stationary while head is in motion. 2.Perform exercises with small head movements (45 to either side of midline). 3.Increase speed of head motion so long as target is in focus. 4.If you wear eyeglasses, be sure you can see target through lens (therapist will give specific instructions for bifocal / progressive lenses). 5.These exercises may provoke dizziness or nausea. Work through these symptoms. If too dizzy, slow head movement slightly. Rest between each exercise. 6.Exercises demand concentration; avoid distractions. 7.For safety, perform standing exercises close to a counter, wall, corner, or next to someone.  Copyright  VHI. All rights reserved.   Gaze Stabilization - Standing Feet Apart   Feet shoulder width apart, keeping eyes on target on wall 3 feet away, tilt head down slightly and move head side to side for 60 seconds. Repeat while moving head up and down for 60 seconds.  Do 2-3 sessions per day.  Gaze Stabilization: Standing Feet Together    Feet together,keeping eyes on target on wall 3 feet away, tilt head down slightly and move head side to side for 60 seconds. Repeat while moving head up and down for 60 seconds.  Do 2-3 sessions per day.Feet Together, Head Motion - Eyes Closed    With eyes closed and feet together, move head slowly, up and down 10 times and side to side 10 times. Repeat _3___ times per session. Do _1___ sessions per day.  Copyright  VHI. All rights reserved.   Copyright  VHI. All rights reserved.

## 2019-04-13 ENCOUNTER — Ambulatory Visit: Payer: Medicare Other

## 2019-04-13 ENCOUNTER — Other Ambulatory Visit: Payer: Self-pay

## 2019-04-13 DIAGNOSIS — R42 Dizziness and giddiness: Secondary | ICD-10-CM | POA: Diagnosis not present

## 2019-04-13 DIAGNOSIS — R262 Difficulty in walking, not elsewhere classified: Secondary | ICD-10-CM

## 2019-04-13 DIAGNOSIS — R2681 Unsteadiness on feet: Secondary | ICD-10-CM

## 2019-04-13 NOTE — Therapy (Signed)
[facility] [address] [place], [place], [address] Phone: [phone]   Fax:  [phone]  Physical Therapy Treatment  Patient Details  Name: Janet Cooper MRN: [id number] Date of Birth: 06-Jan-1930 Referring Provider (PT): Larey Seat, MD   Encounter Date: 04/13/2019  PT End of Session - 04/13/19 1358    Visit Number  6    Number of Visits  9    Date for PT Re-Evaluation  04/18/19    Authorization Type  [employer] MEDICARE; $20 Copay.  10th visit PN    PT Start Time  1316    PT Stop Time  1355    PT Time Calculation (min)  39 min    Equipment Utilized During Treatment  Other (comment)   min guard to S prn   Activity Tolerance  Patient tolerated treatment well    Behavior During Therapy  WFL for tasks assessed/performed       Past Medical History:  Diagnosis Date  . Arthritis    back  . Chronic cystitis   . Detrusor instability of bladder   . Fecal incontinence   . Glaucoma of both eyes   . History of endometrial cancer    1990 dx  Stage IB, Grade 1 endometrial carcinoma  s/p TAH w/ BSO--  recurrence 1994 vaginal cuff  s/p radiation  . History of radiation therapy    1994  lower pelvic area for recurrent endometrial cancer at vaginal cuff  . Hyperlipidemia   . Hypertension   . OAB (overactive bladder)   . Osteopenia 05/2013   T score -2.1 FRAX 8.9%/2.5%  . Urge urinary incontinence   . Wears glasses     Past Surgical History:  Procedure Laterality Date  . ANAL RECTAL MANOMETRY N/A 03/29/2014   Procedure: ANO RECTAL MANOMETRY;  Surgeon: [name], MD;  Location: [facility];  Service: Endoscopy;  Laterality: N/A;  . ANAL RECTAL MANOMETRY N/A 04/28/2014   Procedure: ANO RECTAL MANOMETRY;  Surgeon: [name], MD;  Location: [facility];  Service: Endoscopy;  Laterality: N/A;  . CATARACT EXTRACTION W/ INTRAOCULAR LENS  IMPLANT, BILATERAL  1990's  . GLAUCOMA SURGERY Bilateral 2010   laser   . PATELLA FRACTURE SURGERY Left 1995  . SACRAL NERVE LEAD IMPLANTATION -- STAGE ONE INTERSTIM  [id number]   dr [name]   fecal incontinence  . STAGE TWO SACRAL NERVE STIMULATOR INTERSTIM  09-15-2014  . TOTAL ABDOMINAL HYSTERECTOMY W/ BILATERAL SALPINGOOPHORECTOMY  1990    There were no vitals filed for this visit.  Subjective Assessment - 04/13/19 1318    Subjective  Pt denied falls or changes since last visit. Balance is still an issue with a little bit of lightheadedness when eyes closed. Pt stated walking from parking lot to lobby made her feel unsteady.    Pertinent History  urge urinary incontinence, osteopenia, OAB, HTN, HLD, h/o endometrial CA and radiation therapy, glaucoma of both eyes with laser surgery, sacral nerve stimulator, L patella fracture, hearing loss in L ear, h/o BPPV, back pain and OA    Patient Stated Goals  to continue to improve the dizziness    Currently in Pain?  No/denies                       [facility] Adult PT Treatment/Exercise - 04/13/19 1321      Ambulation/Gait   Ambulation/Gait  Yes    Ambulation/Gait Assistance  4: Min guard    Ambulation Distance (  Feet)  1000 Feet   outdoors and 230', 75' indoors    Assistive device  Straight cane    Gait Pattern  Step-through pattern;Decreased stride length    Ambulation Surface  Indoor;Unlevel;Level;Outdoor;Paved    Gait velocity  2.20ft/sec. and 2.17ft/sec. with SPC and 2.52ft/sec. and 2.34ft/sec without SPC    Gait Comments  Cues and demo for sequencing with SPC.       High Level Balance   High Level Balance Activities  Side stepping;Backward walking;Head turns;Other (comment)   head nods   High Level Balance Comments  Performed in // bars with intermittent UE support and S for safey. 4x10/activity. Cues and demo for proper technique.              PT Education - 04/13/19 1357    Education Details  PT provided pt with [facility] handout and encouraged pt to purchase [facility] prior to next  session to improve balance during gait. PT discussed outcome measure results. PT educated pt on sequencing with SPC.    Person(s) Educated  Patient    Methods  Explanation;Demonstration;Tactile cues;Verbal cues;Handout    Comprehension  Returned demonstration;Verbalized understanding       PT Short Term Goals - 03/23/19 1650      PT SHORT TERM GOAL #1   Title  Patient will demonstrate independence with initial vestibular and balance HEP    Time  4    Period  Weeks    Status  Achieved    Target Date  03/19/19      PT SHORT TERM GOAL #2   Title  Pt will participate in further assessment of symptoms with orthostatic assessment and FGA    Time  4    Period  Weeks    Status  Achieved    Target Date  03/19/19        PT Long Term Goals - 03/10/19 1146      PT LONG TERM GOAL #1   Title  Pt will be independent with HEP and walking program    Time  8    Period  Weeks    Status  New    Target Date  04/18/19      PT LONG TERM GOAL #2   Title  Pt will report no symptoms when performing supine <> sit, sit <> stand, and when looking from floor <> ceiling    Time  8    Period  Weeks    Status  New    Target Date  04/18/19      PT LONG TERM GOAL #3   Title  If demonstrates orthostatic hypotension - will verbalize safe method for transitioning from supine > sit > stand to minimize risk for syncope    Baseline  no evidence of orthostasis when assessed on 10/5    Status  Deferred      PT LONG TERM GOAL #4   Title  Pt will demonstrate improved use of multiple sensory input for balance as indicated by ability to perform 30 seconds on each condition of MCTSIB with supervision    Baseline  30 seconds for condition 1/2 with minimal sway; conditions 3 and 4 pt required min A to complete 30 seconds    Time  8    Period  Weeks    Status  Revised    Target Date  04/18/19      PT LONG TERM GOAL #5   Title  Pt will report being able to cook a  whole meal in standing without needing to sit  down due to symptoms    Time  8    Period  Weeks    Status  New    Target Date  04/18/19            Plan - 04/13/19 1359    Clinical Impression Statement  Today's skilled session focused on sequencing with SPC to improve balance during gait, especially long distances. PT also had pt trial higher level balance activities, which pt still requires intermittent UE support for safety 2/2 impaired balance. Pt would continue to benefit from skilled PT to improve safety during functional mobility.    Personal Factors and Comorbidities  Age;Comorbidity 3+;Social Background;Past/Current Experience    Comorbidities  urge urinary incontinence, osteopenia, OAB, HTN, HLD, h/o endometrial CA and radiation therapy, glaucoma of both eyes with laser surgery, sacral nerve stimulator, L patella fracture, hearing loss in L ear, h/o BPPV, back pain and OA    Examination-Activity Limitations  Bend;Locomotion Level;Stand;Stairs    Examination-Participation Restrictions  Meal Prep;Cleaning    Stability/Clinical Decision Making  Evolving/Moderate complexity    Rehab Potential  Good    PT Frequency  1x / week    PT Duration  8 weeks    PT Treatment/Interventions  ADLs/Self Care Home Management;Canalith Repostioning;Gait training;Functional mobility training;Therapeutic activities;Therapeutic exercise;Balance training;Neuromuscular re-education;Patient/family education;Vestibular    PT Next Visit Plan  Defer LTGs for one week, as pt unable to get in for appt. 2/2 full schedules. Assess pt's new SPC (adjust height prn). progress VOR to more narrow BOS with compliant surface?; bending down to the floor for items with rotation; and sit <> stand to compliant surface; Initiate standing balance in corner (review as needed), gait with head turns    Consulted and Agree with Plan of Care  Patient       Patient will benefit from skilled therapeutic intervention in order to improve the following deficits and impairments:   Decreased activity tolerance, Decreased balance, Difficulty walking, Dizziness, Pain  Visit Diagnosis: Difficulty in walking, not elsewhere classified  Unsteadiness on feet  Dizziness and giddiness     Problem List Patient Active Problem List   Diagnosis Date Noted  . BPV (benign positional vertigo), left 02/10/2019  . Hearing loss of left ear 02/10/2019  . Nausea 02/10/2019  . Rectocele 09/16/2013    Ahmadou Bolz L 04/13/2019, 2:02 PM  [facility] [address] [place], [place], [address] Phone: [phone]   Fax:  [phone]  Name: ADDYSYN FAGLIE MRN: [id number] Date of Birth: April 16, 1930  Geoffry Paradise, PT,DPT 04/13/19 2:02 PM Phone: [phone] Fax: [phone]

## 2019-04-28 ENCOUNTER — Ambulatory Visit: Payer: Medicare Other | Admitting: Physical Therapy

## 2019-04-28 ENCOUNTER — Other Ambulatory Visit: Payer: Self-pay

## 2019-04-28 VITALS — BP 105/72 | HR 85

## 2019-04-28 DIAGNOSIS — R2681 Unsteadiness on feet: Secondary | ICD-10-CM

## 2019-04-28 DIAGNOSIS — R42 Dizziness and giddiness: Secondary | ICD-10-CM

## 2019-04-28 NOTE — Patient Instructions (Signed)
Standing Marching   Using a chair if necessary, march in place. Repeat 10 times. Do 1 sessions per day.  http://gt2.exer.us/344     Hip Backward Kick   Using a chair for balance, keep legs shoulder width apart and toes pointed for- ward. Slowly extend one leg back, keeping knee straight. Do not lean forward. Repeat with other leg. Repeat 10  times. Do 1 sessions per day.  http://gt2.exer.us/340   Copyright  VHI. All rights reserved.     Hip Side Kick   Holding a chair for balance, keep legs shoulder width apart and toes pointed forward. Swing a leg out to side, keeping knee straight. Do not lean. Repeat using other leg. Repeat 10  times. Do 1 sessions per day.  ALSO DO FORWARD KICKS - 10 reps each leg alternating        Standing On One Leg Without Support .  Stand on one leg in neutral spine without support. Hold 10 seconds. Repeat on other leg. Do 2 repetitions, 1 sets.  http://bt.exer.us/36   Copyright  VHI. All rights reserved.

## 2019-04-29 ENCOUNTER — Encounter: Payer: Self-pay | Admitting: Physical Therapy

## 2019-04-29 NOTE — Therapy (Signed)
[facility] [address] [place], [place], [address] Phone: [phone]   Fax:  [phone]  Physical Therapy Treatment  Patient Details  Name: Janet Cooper MRN: [id number] Date of Birth: [birth year] Referring Provider (PT): Larey Seat, MD   Encounter Date: 04/28/2019  PT End of Session - 04/29/19 2022    Visit Number  7    Number of Visits  9    Date for PT Re-Evaluation  04/18/19    Authorization Type  [employer] MEDICARE; $20 Copay.  10th visit PN    PT Start Time  1317    PT Stop Time  1401    PT Time Calculation (min)  44 min    Activity Tolerance  Patient tolerated treatment well    Behavior During Therapy  WFL for tasks assessed/performed       Past Medical History:  Diagnosis Date  . Arthritis    back  . Chronic cystitis   . Detrusor instability of bladder   . Fecal incontinence   . Glaucoma of both eyes   . History of endometrial cancer    1990 dx  Stage IB, Grade 1 endometrial carcinoma  s/p TAH w/ BSO--  recurrence 1994 vaginal cuff  s/p radiation  . History of radiation therapy    1994  lower pelvic area for recurrent endometrial cancer at vaginal cuff  . Hyperlipidemia   . Hypertension   . OAB (overactive bladder)   . Osteopenia 05/2013   T score -2.1 FRAX 8.9%/2.5%  . Urge urinary incontinence   . Wears glasses     Past Surgical History:  Procedure Laterality Date  . ANAL RECTAL MANOMETRY N/A 03/29/2014   Procedure: ANO RECTAL MANOMETRY;  Surgeon: [name], MD;  [facility];  Service: Endoscopy;  Laterality: N/A;  . ANAL RECTAL MANOMETRY N/A 04/28/2014   Procedure: ANO RECTAL MANOMETRY;  Surgeon: [name], MD;  [facility]: [facility];  Service: Endoscopy;  Laterality: N/A;  . CATARACT EXTRACTION W/ INTRAOCULAR LENS  IMPLANT, BILATERAL  1990's  . GLAUCOMA SURGERY Bilateral 2010   laser  . PATELLA FRACTURE SURGERY Left 1995  . SACRAL NERVE LEAD IMPLANTATION --  STAGE ONE INTERSTIM  [id number]   dr [name]   fecal incontinence  . STAGE TWO SACRAL NERVE STIMULATOR INTERSTIM  09-15-2014  . TOTAL ABDOMINAL HYSTERECTOMY W/ BILATERAL SALPINGOOPHORECTOMY  1990    Vitals:   04/28/19 1344 04/28/19 1345  BP: 114/62 105/72  Pulse: 76 85    Subjective Assessment - 04/28/19 1321    Subjective  Pt reports she purchased cane from [employer] - just got it a few days ago; felt more secure using it today with walking from car into clinic.  pt states she tries to be careful and not move too fast - reports minimal dizziness due to her being cautious and moving more slowly    Pertinent History  urge urinary incontinence, osteopenia, OAB, HTN, HLD, h/o endometrial CA and radiation therapy, glaucoma of both eyes with laser surgery, sacral nerve stimulator, L patella fracture, hearing loss in L ear, h/o BPPV, back pain and OA    Patient Stated Goals  to continue to improve the dizziness    Currently in Pain?  Other (Comment)   pt reports she always has pain due to arthritis in her back            Vestibular Assessment - 04/29/19 0001      Positional Testing   Dix-Hallpike  Dix-Hallpike Right;Dix-Hallpike Left    Sidelying Test  Sidelying Right;Sidelying Left      Dix-Hallpike Right   Dix-Hallpike Right Duration  none    Dix-Hallpike Right Symptoms  No nystagmus      Dix-Hallpike Left   Dix-Hallpike Left Duration  none    Dix-Hallpike Left Symptoms  No nystagmus      Sidelying Right   Sidelying Right Duration  none    Sidelying Right Symptoms  No nystagmus      Sidelying Left   Sidelying Left Duration  none    Sidelying Left Symptoms  No nystagmus      Pt performed standing balance exs - forward, back and side kicks 10 reps each with UE support on // bars Marching in place 10 reps each leg SLS 10 sec hold 2 reps each leg Pt was given these exercises for HEP        [facility] Adult PT Treatment/Exercise - 04/29/19 0001      Self-Care    Self-Care  Other Self-Care Comments    Other Self-Care Comments   Pt's SPC checked for correct height as pt states it was adjusted by person at [employer], where she purchased the cane; [place] adjusted correctly              PT Education - 04/29/19 2022    Education Details  instructed in balance HEP    Person(s) Educated  Patient    Methods  Explanation;Demonstration;Handout    Comprehension  Verbalized understanding;Returned demonstration       PT Short Term Goals - 04/29/19 2026      PT SHORT TERM GOAL #1   Title  Patient will demonstrate independence with initial vestibular and balance HEP    Time  4    Period  Weeks    Status  Achieved    Target Date  03/19/19      PT SHORT TERM GOAL #2   Title  Pt will participate in further assessment of symptoms with orthostatic assessment and FGA    Time  4    Period  Weeks    Status  Achieved    Target Date  03/19/19        PT Long Term Goals - 04/29/19 2026      PT LONG TERM GOAL #1   Title  Pt will be independent with HEP and walking program    Time  8    Period  Weeks    Status  New      PT LONG TERM GOAL #2   Title  Pt will report no symptoms when performing supine <> sit, sit <> stand, and when looking from floor <> ceiling    Time  8    Period  Weeks    Status  New      PT LONG TERM GOAL #3   Title  If demonstrates orthostatic hypotension - will verbalize safe method for transitioning from supine > sit > stand to minimize risk for syncope    Baseline  no evidence of orthostasis when assessed on 10/5    Status  Deferred      PT LONG TERM GOAL #4   Title  Pt will demonstrate improved use of multiple sensory input for balance as indicated by ability to perform 30 seconds on each condition of MCTSIB with supervision    Baseline  30 seconds for condition 1/2 with minimal sway; conditions 3 and 4 pt required min A to complete 30  seconds    Time  8    Period  Weeks    Status  Revised      PT LONG TERM GOAL #5   Title   Pt will report being able to cook a whole meal in standing without needing to sit down due to symptoms    Time  8    Period  Weeks    Status  New            Plan - 04/29/19 2024    Clinical Impression Statement  Pt had no spinning vertigo with any positional testing and no nystagmus noted with any positional testing - BPPV appears to have resolved at this time.  Pt does present with age related balance deficits.    Personal Factors and Comorbidities  Age;Comorbidity 3+;Social Background;Past/Current Experience    Comorbidities  urge urinary incontinence, osteopenia, OAB, HTN, HLD, h/o endometrial CA and radiation therapy, glaucoma of both eyes with laser surgery, sacral nerve stimulator, L patella fracture, hearing loss in L ear, h/o BPPV, back pain and OA    Examination-Activity Limitations  Bend;Locomotion Level;Stand;Stairs    Examination-Participation Restrictions  Meal Prep;Cleaning    Stability/Clinical Decision Making  Evolving/Moderate complexity    Rehab Potential  Good    PT Frequency  1x / week    PT Duration  8 weeks    PT Treatment/Interventions  ADLs/Self Care Home Management;Canalith Repostioning;Gait training;Functional mobility training;Therapeutic activities;Therapeutic exercise;Balance training;Neuromuscular re-education;Patient/family education;Vestibular    PT Next Visit Plan  D/C next session    Consulted and Agree with Plan of Care  Patient       Patient will benefit from skilled therapeutic intervention in order to improve the following deficits and impairments:  Decreased activity tolerance, Decreased balance, Difficulty walking, Dizziness, Pain  Visit Diagnosis: Unsteadiness on feet  Dizziness and giddiness     Problem List Patient Active Problem List   Diagnosis Date Noted  . BPV (benign positional vertigo), left 02/10/2019  . Hearing loss of left ear 02/10/2019  . Nausea 02/10/2019  . Rectocele 09/16/2013    Alda Lea,  PT 04/29/2019, 8:27 PM  [facility] [address] [place], [place], [address] Phone: [phone]   Fax:  [phone]  Name: Janet Cooper MRN: [id number] Date of Birth: 1929-10-11

## 2019-05-05 ENCOUNTER — Encounter: Payer: Self-pay | Admitting: Physical Therapy

## 2019-05-05 ENCOUNTER — Ambulatory Visit: Payer: Medicare Other | Attending: Neurology | Admitting: Physical Therapy

## 2019-05-05 ENCOUNTER — Other Ambulatory Visit: Payer: Self-pay

## 2019-05-05 DIAGNOSIS — R42 Dizziness and giddiness: Secondary | ICD-10-CM

## 2019-05-06 NOTE — Therapy (Signed)
[facility] [address] [place], [place], [address] Phone: [phone]   Fax:  [phone]  Physical Therapy Treatment & Discharge summary  Patient Details  Name: Janet Cooper MRN: [id number] Date of Birth: 1929/06/25 Referring Provider (PT): Larey Seat, MD   Encounter Date: 05/05/2019  PT End of Session - 05/06/19 2150    Visit Number  8    Number of Visits  9    Date for PT Re-Evaluation  04/18/19    Authorization Type  [employer] MEDICARE; $20 Copay.  10th visit PN    PT Start Time  1320    PT Stop Time  1347    PT Time Calculation (min)  27 min    Activity Tolerance  Patient tolerated treatment well    Behavior During Therapy  WFL for tasks assessed/performed       Past Medical History:  Diagnosis Date  . Arthritis    back  . Chronic cystitis   . Detrusor instability of bladder   . Fecal incontinence   . Glaucoma of both eyes   . History of endometrial cancer    1990 dx  Stage IB, Grade 1 endometrial carcinoma  s/p TAH w/ BSO--  recurrence 1994 vaginal cuff  s/p radiation  . History of radiation therapy    1994  lower pelvic area for recurrent endometrial cancer at vaginal cuff  . Hyperlipidemia   . Hypertension   . OAB (overactive bladder)   . Osteopenia 05/2013   T score -2.1 FRAX 8.9%/2.5%  . Urge urinary incontinence   . Wears glasses     Past Surgical History:  Procedure Laterality Date  . ANAL RECTAL MANOMETRY N/A 03/29/2014   Procedure: ANO RECTAL MANOMETRY;  Surgeon: [name], MD;  Location: [facility];  Service: Endoscopy;  Laterality: N/A;  . ANAL RECTAL MANOMETRY N/A 04/28/2014   Procedure: ANO RECTAL MANOMETRY;  Surgeon: [name], MD;  Location: [facility];  Service: Endoscopy;  Laterality: N/A;  . CATARACT EXTRACTION W/ INTRAOCULAR LENS  IMPLANT, BILATERAL  1990's  . GLAUCOMA SURGERY Bilateral 2010   laser  . PATELLA FRACTURE SURGERY Left 1995  . SACRAL NERVE LEAD  IMPLANTATION -- STAGE ONE INTERSTIM  [id number]   dr [name]   fecal incontinence  . STAGE TWO SACRAL NERVE STIMULATOR INTERSTIM  09-15-2014  . TOTAL ABDOMINAL HYSTERECTOMY W/ BILATERAL SALPINGOOPHORECTOMY  1990    There were no vitals filed for this visit.  Subjective Assessment - 05/06/19 2149    Subjective  Pt states she feels she must be very careful when she puts her head down to tie her shoes - still gets light headed when she does this movement                               PT Education - 05/06/19 2149    Education Details  reviewed LTG's, progress, HEP and D/C plan    Person(s) Educated  Patient    Methods  Explanation    Comprehension  Verbalized understanding       PT Short Term Goals - 05/05/19 1325      PT SHORT TERM GOAL #1   Title  Patient will demonstrate independence with initial vestibular and balance HEP    Time  4    Period  Weeks    Status  Achieved    Target Date  03/19/19      PT  SHORT TERM GOAL #2   Title  Pt will participate in further assessment of symptoms with orthostatic assessment and FGA    Time  4    Period  Weeks    Status  Achieved    Target Date  03/19/19        PT Long Term Goals - 05/05/19 1325      PT LONG TERM GOAL #1   Title  Pt will be independent with HEP and walking program    Baseline  pt states she is not walking consistently outside for walking program but states she is doing the exercises in her HEP - 05-05-19    Time  8    Period  Weeks    Status  Partially Met      PT LONG TERM GOAL #2   Title  Pt will report no symptoms when performing supine <> sit, sit <> stand, and when looking from floor <> ceiling    Baseline  met 05-05-19    Time  8    Period  Weeks    Status  Achieved      PT LONG TERM GOAL #3   Title  If demonstrates orthostatic hypotension - will verbalize safe method for transitioning from supine > sit > stand to minimize risk for syncope    Baseline  no evidence of  orthostasis when assessed on 10/5    Status  Deferred      PT LONG TERM GOAL #4   Title  Pt will demonstrate improved use of multiple sensory input for balance as indicated by ability to perform 30 seconds on each condition of MCTSIB with supervision    Baseline  30 seconds for condition 1/2 with minimal sway; conditions 3 and 4 pt required min A to complete 30 seconds    Time  8    Period  Weeks    Status  Deferred      PT LONG TERM GOAL #5   Title  Pt will report being able to cook a whole meal in standing without needing to sit down due to symptoms    Baseline  Pt reports she is unable to cook entire meal but is due to fecal incontinence, not due to dizziness - 05-05-19    Time  8    Period  Weeks    Status  Not Met            Plan - 05/06/19 2151    Personal Factors and Comorbidities  Age;Comorbidity 3+;Social Background;Past/Current Experience    Comorbidities  urge urinary incontinence, osteopenia, OAB, HTN, HLD, h/o endometrial CA and radiation therapy, glaucoma of both eyes with laser surgery, sacral nerve stimulator, L patella fracture, hearing loss in L ear, h/o BPPV, back pain and OA    Examination-Activity Limitations  Bend;Locomotion Level;Stand;Stairs    Examination-Participation Restrictions  Meal Prep;Cleaning    Stability/Clinical Decision Making  Evolving/Moderate complexity    Rehab Potential  Good    PT Frequency  1x / week    PT Duration  8 weeks    PT Treatment/Interventions  ADLs/Self Care Home Management;Canalith Repostioning;Gait training;Functional mobility training;Therapeutic activities;Therapeutic exercise;Balance training;Neuromuscular re-education;Patient/family education;Vestibular    PT Next Visit Plan  D/C next session    Consulted and Agree with Plan of Care  Patient       Patient will benefit from skilled therapeutic intervention in order to improve the following deficits and impairments:  Decreased activity tolerance, Decreased balance,  Difficulty walking, Dizziness, Pain  Visit Diagnosis: Dizziness and giddiness     Problem List Patient Active Problem List   Diagnosis Date Noted  . BPV (benign positional vertigo), left 02/10/2019  . Hearing loss of left ear 02/10/2019  . Nausea 02/10/2019  . Rectocele 09/16/2013    Alda Lea, PT 05/06/2019, 9:54 PM  [facility] [address] [place], [place], [address] Phone: [phone]   Fax:  [phone]  Name: Janet Cooper MRN: [id number] Date of Birth: 17-Jul-1929

## 2019-06-10 ENCOUNTER — Encounter: Payer: Self-pay | Admitting: Podiatry

## 2019-06-10 ENCOUNTER — Other Ambulatory Visit: Payer: Self-pay

## 2019-06-10 ENCOUNTER — Ambulatory Visit: Payer: Medicare Other | Admitting: Podiatry

## 2019-06-10 DIAGNOSIS — M79676 Pain in unspecified toe(s): Secondary | ICD-10-CM | POA: Diagnosis not present

## 2019-06-10 DIAGNOSIS — B351 Tinea unguium: Secondary | ICD-10-CM

## 2019-06-10 NOTE — Progress Notes (Addendum)
Complaint:  Visit Type: Patient returns to my office for continued preventative foot care services. Complaint: Patient states" my nails second toe has grown thick and disfigured and painful.  Patient has not been seen for 3 years. . The patient presents for preventative foot care services.  She says she cannot trim her nails herself.  Podiatric Exam: Vascular: dorsalis pedis and posterior tibial pulses are palpable bilateral. Capillary return is immediate. Temperature gradient is WNL. Skin turgor WNL  Sensorium: Normal Semmes Weinstein monofilament test. Normal tactile sensation bilaterally. Nail Exam: Pt has thick disfigured discolored nails with subungual debris second toenail right foot. Ulcer Exam: There is no evidence of ulcer or pre-ulcerative changes or infection. Orthopedic Exam: Muscle tone and strength are WNL. No limitations in general ROM. No crepitus or effusions noted. Foot type and digits show no abnormalities. Bony prominences are unremarkable. Skin: No Porokeratosis. No infection or ulcers  Diagnosis:  Onychomycosis, , Pain in right toe, pain in left toes  Treatment & Plan Procedures and Treatment: Consent by patient was obtained for treatment procedures.   Debridement of mycotic and hypertrophic toenails, 1 through 5 bilateral and clearing of subungual debris. No ulceration, no infection noted.  Return Visit-Office Procedure: Patient instructed to return to the office for a follow up visit 3 months for continued evaluation and treatment.    Gardiner Barefoot DPM

## 2019-06-11 DIAGNOSIS — B351 Tinea unguium: Secondary | ICD-10-CM | POA: Insufficient documentation

## 2019-06-26 ENCOUNTER — Ambulatory Visit: Payer: Medicare PPO | Attending: Internal Medicine

## 2019-06-26 DIAGNOSIS — Z23 Encounter for immunization: Secondary | ICD-10-CM | POA: Insufficient documentation

## 2019-06-26 NOTE — Progress Notes (Signed)
   Covid-19 Vaccination Clinic  Name:  Janet Cooper    MRN: [id number] DOB: 10-Jun-1929  06/26/2019  Janet Cooper was observed post Covid-19 immunization for 15 minutes without incidence. She was provided with Vaccine Information Sheet and instruction to access the V-Safe system.   Janet Cooper was instructed to call 911 with any severe reactions post vaccine: Marland Kitchen Difficulty breathing  . Swelling of your face and throat  . A fast heartbeat  . A bad rash all over your body  . Dizziness and weakness    Immunizations Administered    Name Date Dose VIS Date Route   Pfizer COVID-19 Vaccine 06/26/2019  9:43 AM 0.3 mL 05/15/2019 Intramuscular   Manufacturer: [place]   Lot: [id number]   [place]: [id number]

## 2019-07-17 ENCOUNTER — Ambulatory Visit: Payer: Medicare PPO

## 2019-07-18 ENCOUNTER — Ambulatory Visit: Payer: Medicare PPO | Attending: Internal Medicine

## 2019-07-18 DIAGNOSIS — Z23 Encounter for immunization: Secondary | ICD-10-CM | POA: Insufficient documentation

## 2019-07-18 NOTE — Progress Notes (Signed)
   Covid-19 Vaccination Clinic  Name:  Janet Cooper    MRN: [id number] DOB: 11-Apr-1930  07/18/2019  [name] was observed post Covid-19 immunization for 15 minutes without incidence. She was provided with Vaccine Information Sheet and instruction to access the V-Safe system.   [name] was instructed to call 911 with any severe reactions post vaccine: Marland Kitchen Difficulty breathing  . Swelling of your face and throat  . A fast heartbeat  . A bad rash all over your body  . Dizziness and weakness    Immunizations Administered    Name Date Dose VIS Date Route   Pfizer COVID-19 Vaccine 07/18/2019 12:26 PM 0.3 mL 05/15/2019 Intramuscular   Manufacturer: [place]   Lot: [id number]   [place]: [id number]

## 2019-09-09 ENCOUNTER — Other Ambulatory Visit: Payer: Self-pay

## 2019-09-09 ENCOUNTER — Encounter: Payer: Self-pay | Admitting: Podiatry

## 2019-09-09 ENCOUNTER — Ambulatory Visit: Payer: Medicare PPO | Admitting: Podiatry

## 2019-09-09 VITALS — Temp 95.4°F

## 2019-09-09 DIAGNOSIS — B351 Tinea unguium: Secondary | ICD-10-CM

## 2019-09-09 NOTE — Progress Notes (Signed)
This patient returns to the office for evaluation and treatment of long thick painful nail second toe right foot. .  This patient is unable to trim his own nails since the patient cannot reach the feet.  Patient says the nail is  painful walking and wearing her shoes.  He returns for preventive foot care services.  General Appearance  Alert, conversant and in no acute stress.  Vascular  Dorsalis pedis and posterior tibial  pulses are palpable  bilaterally.  Capillary return is within normal limits  bilaterally. Temperature is within normal limits  bilaterally.  Neurologic  Senn-Weinstein monofilament wire test within normal limits  bilaterally. Muscle power within normal limits bilaterally.  Nails Thick disfigured discolored nails with subungual debris  Second toenail right foot. No evidence of bacterial infection or drainage bilaterally.  Orthopedic  No limitations of motion  feet .  No crepitus or effusions noted.  No bony pathology or digital deformities noted.  Skin  normotropic skin with no porokeratosis noted bilaterally.  No signs of infections or ulcers noted.     Onychomycosis  Pain in toes right foot   Debridement  of nail second toe nail right foot. with a nail nipper.  Nails were then filed using a dremel tool with no incidents.    RTC  3 months   Gardiner Barefoot DPM

## 2019-09-15 ENCOUNTER — Encounter: Payer: Self-pay | Admitting: Orthopaedic Surgery

## 2019-09-15 ENCOUNTER — Ambulatory Visit: Payer: Medicare PPO | Admitting: Orthopaedic Surgery

## 2019-09-15 ENCOUNTER — Other Ambulatory Visit: Payer: Self-pay

## 2019-09-15 DIAGNOSIS — M545 Low back pain, unspecified: Secondary | ICD-10-CM

## 2019-09-15 DIAGNOSIS — G8929 Other chronic pain: Secondary | ICD-10-CM | POA: Diagnosis not present

## 2019-09-15 DIAGNOSIS — M25512 Pain in left shoulder: Secondary | ICD-10-CM

## 2019-09-15 DIAGNOSIS — M25511 Pain in right shoulder: Secondary | ICD-10-CM

## 2019-09-15 MED ORDER — PREDNISONE 10 MG (21) PO TBPK: ORAL_TABLET | ORAL | 0 refills | Status: DC

## 2019-09-15 MED ORDER — METHOCARBAMOL 500 MG PO TABS: 500.0000 mg | ORAL_TABLET | Freq: Two times a day (BID) | ORAL | 0 refills | Status: DC | PRN

## 2019-09-15 MED ORDER — LIDOCAINE HCL 2 % IJ SOLN
2.0000 mL | INTRAMUSCULAR | Status: AC | PRN
  Administered 2019-09-15: 2 mL

## 2019-09-15 MED ORDER — METHYLPREDNISOLONE ACETATE 40 MG/ML IJ SUSP
40.0000 mg | INTRAMUSCULAR | Status: AC | PRN
  Administered 2019-09-15: 40 mg via INTRA_ARTICULAR

## 2019-09-15 MED ORDER — BUPIVACAINE HCL 0.25 % IJ SOLN
2.0000 mL | INTRAMUSCULAR | Status: AC | PRN
  Administered 2019-09-15: 2 mL via INTRA_ARTICULAR

## 2019-09-15 NOTE — Progress Notes (Signed)
Office Visit Note   Patient: Janet Cooper           Date of Birth: November 19, 1929           MRN: [id number] Visit Date: 09/15/2019              Requested by: Aletha Halim., PA-C [address]  [place] [address] PCP: Aletha Halim., PA-C   Assessment & Plan: Visit Diagnoses:  1. Chronic pain of both shoulders   2. Lumbar pain     Plan: Impression is #1 right shoulder glenohumeral osteoarthritis.  #2 left shoulder rotator cuff tendinitis and subacromial bursitis.  #3 chronic midline lower back pain.  In regards to the right shoulder, she does not feel this is bothersome enough to proceed with intervention.  Should her symptoms worsen, she will let us know we will refer her to Dr. Junius Roads for glenohumeral cortisone injection.  In regards to the left shoulder, we will proceed with subacromial cortisone injection.  In regards to her lower back, I have called in a Sterapred taper and muscle relaxer.  We discussed formal physical therapy but she feels due to her underlying bowel problems she does not think she can attend.  Follow-up with [place] as needed.  Follow-Up Instructions: Return if symptoms worsen or fail to improve.   Orders:  Orders Placed This Encounter  Procedures  . Large Joint Inj   Meds ordered this encounter  Medications  . predniSONE (STERAPRED UNI-PAK 21 TAB) 10 MG (21) TBPK tablet    Sig: Take as directed    Dispense:  21 tablet    Refill:  0  . methocarbamol (ROBAXIN) 500 MG tablet    Sig: Take 1 tablet (500 mg total) by mouth 2 (two) times daily as needed.    Dispense:  20 tablet    Refill:  0      Procedures: Large Joint Inj: L subacromial bursa on 09/15/2019 2:43 PM Indications: pain Details: 22 G needle Medications: 2 mL lidocaine 2 %; 2 mL bupivacaine 0.25 %; 40 mg methylPREDNISolone acetate 40 MG/ML Outcome: tolerated well, no immediate complications Patient was prepped and draped in the usual sterile fashion.       Clinical  Data: No additional findings.   Subjective: Chief Complaint  Patient presents with  . Left Shoulder - Pain  . Lower Back - Pain  . Right Shoulder - Pain    HPI patient is a pleasant 86 year old female who comes in today with bilateral shoulder pain left greater than right and midline lower back pain.  In regards to her shoulders, the pain she has has been ongoing for the past year and is progressively worsened.  No specific injury or change in activity.  The majority of her pain is to the deltoid.  This is worse when she is pulling up her pants as well as when she internally rotates her shoulder.  She has tried over-the-counter pain medication without relief of symptoms.  No numbness, tingling or burning to the upper extremities.  No previous cortisone injection or surgical intervention to either shoulder.  In regards to her lower back, the pain she has is to the midline and this has been ongoing for the past 10 years.  No specific injury.  The pain is worse after standing for greater than 5 to 10 minutes at a time or when she is vacuuming.  She gets mild relief when sitting.  Over-the-counter pain medications do not help her  symptoms.  No previous epidural steroid injection, surgical intervention or formal physical therapy.  She has been going to a chiropractor for the past 5 to 6 years which does not seem to be helping.  Review of Systems as detailed in HPI.  All others reviewed and are negative.   Objective: Vital Signs: There were no vitals taken for this visit.  Physical Exam well-developed well-nourished female in no acute distress.  Alert oriented x3.  Ortho Exam examination of both shoulders reveals full active range of motion all planes.  She can internally rotate to L5 both sides.  She does have pain with this on the left.  She does have a positive empty can on both sides worse on the left.  Near full strength throughout.  Negative cross body adduction.  No tenderness to the [facility]  joints.  She is neurovascularly intact distally.  Examination of her lumbar spine reveals moderate tenderness to the lower lumbar spine.  No pain with lumbar flexion, extension or rotation.  Negative straight leg raise.  No focal weakness.  She is neurovascular intact distally.  Specialty Comments:  No specialty comments available.  Imaging: X-rays reviewed by me in canopy reveal moderate degenerative changes to the glenohumeral joint on the right.  Left shoulder x-ray showed no acute or structural abnormalities.  Lumbar x-rays show marked degenerative disc disease L4-5 and to the SI joints.   PMFS History: Patient Active Problem List   Diagnosis Date Noted  . Onychomycosis of toenail 06/11/2019  . BPV (benign positional vertigo), left 02/10/2019  . Hearing loss of left ear 02/10/2019  . Nausea 02/10/2019  . Rectocele 09/16/2013   Past Medical History:  Diagnosis Date  . Arthritis    back  . Chronic cystitis   . Detrusor instability of bladder   . Fecal incontinence   . Glaucoma of both eyes   . History of endometrial cancer    1990 dx  Stage IB, Grade 1 endometrial carcinoma  s/p TAH w/ BSO--  recurrence 1994 vaginal cuff  s/p radiation  . History of radiation therapy    1994  lower pelvic area for recurrent endometrial cancer at vaginal cuff  . Hyperlipidemia   . Hypertension   . OAB (overactive bladder)   . Osteopenia 05/2013   T score -2.1 FRAX 8.9%/2.5%  . Urge urinary incontinence   . Wears glasses     Family History  Problem Relation Age of Onset  . Colon cancer Mother   . Cancer Father        Liver cancer  . Heart disease Maternal Grandmother   . Heart disease Maternal Grandfather     Past Surgical History:  Procedure Laterality Date  . ANAL RECTAL MANOMETRY N/A 03/29/2014   Procedure: ANO RECTAL MANOMETRY;  Surgeon: [name], MD;  Location: [facility];  Service: Endoscopy;  Laterality: N/A;  . ANAL RECTAL MANOMETRY N/A 04/28/2014   Procedure: ANO  RECTAL MANOMETRY;  Surgeon: [name], MD;  Location: [facility];  Service: Endoscopy;  Laterality: N/A;  . CATARACT EXTRACTION W/ INTRAOCULAR LENS  IMPLANT, BILATERAL  1990's  . GLAUCOMA SURGERY Bilateral 2010   laser  . PATELLA FRACTURE SURGERY Left 1995  . SACRAL NERVE LEAD IMPLANTATION -- STAGE ONE INTERSTIM  [id number]   dr [name]   fecal incontinence  . STAGE TWO SACRAL NERVE STIMULATOR INTERSTIM  09-15-2014  . TOTAL ABDOMINAL HYSTERECTOMY W/ BILATERAL SALPINGOOPHORECTOMY  1990   Social History   Occupational History  . Not  on file  Tobacco Use  . Smoking status: Never Smoker  . Smokeless tobacco: Never Used  Substance and Sexual Activity  . Alcohol use: Not Currently    Alcohol/week: 1.0 standard drinks    Types: 1 Standard drinks or equivalent per week    Comment: Occas  . Drug use: No  . Sexual activity: Not on file

## 2019-10-14 ENCOUNTER — Other Ambulatory Visit: Payer: Self-pay | Admitting: Family Medicine

## 2019-10-14 DIAGNOSIS — Z1231 Encounter for screening mammogram for malignant neoplasm of breast: Secondary | ICD-10-CM

## 2019-11-12 ENCOUNTER — Ambulatory Visit: Payer: Medicare PPO

## 2019-11-19 ENCOUNTER — Other Ambulatory Visit: Payer: Self-pay

## 2019-11-19 ENCOUNTER — Ambulatory Visit
Admission: RE | Admit: 2019-11-19 | Discharge: 2019-11-19 | Disposition: A | Discharge status: Home / Self Care | Payer: Medicare PPO | Source: Ambulatory Visit | Attending: Family Medicine | Admitting: Family Medicine

## 2019-11-19 DIAGNOSIS — Z1231 Encounter for screening mammogram for malignant neoplasm of breast: Secondary | ICD-10-CM

## 2019-12-01 ENCOUNTER — Other Ambulatory Visit: Payer: Self-pay

## 2019-12-01 ENCOUNTER — Ambulatory Visit: Payer: Medicare PPO | Admitting: Orthopaedic Surgery

## 2019-12-01 ENCOUNTER — Ambulatory Visit: Payer: Self-pay

## 2019-12-01 DIAGNOSIS — M25511 Pain in right shoulder: Secondary | ICD-10-CM

## 2019-12-01 DIAGNOSIS — G8929 Other chronic pain: Secondary | ICD-10-CM

## 2019-12-01 DIAGNOSIS — M545 Low back pain, unspecified: Secondary | ICD-10-CM

## 2019-12-01 DIAGNOSIS — M25512 Pain in left shoulder: Secondary | ICD-10-CM

## 2019-12-01 DIAGNOSIS — M4807 Spinal stenosis, lumbosacral region: Secondary | ICD-10-CM

## 2019-12-01 NOTE — Progress Notes (Signed)
Office Visit Note   Patient: Janet Cooper           Date of Birth: [birth year]           MRN: [id number] Visit Date: 12/01/2019              Requested by: Aletha Halim., PA-C [address]  [place] [address] PCP: Aletha Halim., PA-C   Assessment & Plan: Visit Diagnoses:  1. Chronic pain of both shoulders   2. Lumbar pain     Plan: Impression is chronic low back pain, right glenohumeral OA, left shoulder pain rotator cuff tendinosis.  For the low back we will need to obtain MRI to evaluate for structural normalities.  With right shoulder we will have Dr. Junius Roads do an intra-articular injection today.  For the left shoulder she has had partial relief from subacromial injection.  We provided her with home exercises and resistance bands.  We will see her back as needed.  Follow-Up Instructions: Return if symptoms worsen or fail to improve.   Orders:  No orders of the defined types were placed in this encounter.  No orders of the defined types were placed in this encounter.     Procedures: No procedures performed   Clinical Data: No additional findings.   Subjective: Chief Complaint  Patient presents with  . Middle Back - Pain  . Lower Back - Pain    Janet Cooper returns today for chronic low back pain.  She continues to have right shoulder pain and left shoulder pain.  She has osteoarthritis of the right glenohumeral joint.  She has chronic low back pain as well.  We did a subacromial injection left shoulder a few months ago which did help.   Review of Systems  Constitutional: Negative.   HENT: Negative.   Eyes: Negative.   Respiratory: Negative.   Cardiovascular: Negative.   Endocrine: Negative.   Musculoskeletal: Negative.   Neurological: Negative.   Hematological: Negative.   Psychiatric/Behavioral: Negative.   All other systems reviewed and are negative.    Objective: Vital Signs: There were no vitals taken for this visit.  Physical  Exam Vitals and nursing note reviewed.  Constitutional:      Appearance: She is well-developed.  Pulmonary:     Effort: Pulmonary effort is normal.  Skin:    General: Skin is warm.     Capillary Refill: Capillary refill takes less than 2 seconds.  Neurological:     Mental Status: She is alert and oriented to person, place, and time.  Psychiatric:        Behavior: Behavior normal.        Thought Content: Thought content normal.        Judgment: Judgment normal.     Ortho Exam Right shoulder exam shows mild to moderate pain with range of motion.  No significant restriction range of motion.  Manual muscle testing is slightly limited secondary to pain. Left shoulder exam is unchanged. Lumbar spine exam is nonfocal. Specialty Comments:  No specialty comments available.  Imaging: No results found.   PMFS History: Patient Active Problem List   Diagnosis Date Noted  . Onychomycosis of toenail 06/11/2019  . BPV (benign positional vertigo), left 02/10/2019  . Hearing loss of left ear 02/10/2019  . Nausea 02/10/2019  . Rectocele 09/16/2013   Past Medical History:  Diagnosis Date  . Arthritis    back  . Chronic cystitis   . Detrusor instability of bladder   .  Fecal incontinence   . Glaucoma of both eyes   . History of endometrial cancer    1990 dx  Stage IB, Grade 1 endometrial carcinoma  s/p TAH w/ BSO--  recurrence 1994 vaginal cuff  s/p radiation  . History of radiation therapy    1994  lower pelvic area for recurrent endometrial cancer at vaginal cuff  . Hyperlipidemia   . Hypertension   . OAB (overactive bladder)   . Osteopenia 05/2013   T score -2.1 FRAX 8.9%/2.5%  . Urge urinary incontinence   . Wears glasses     Family History  Problem Relation Age of Onset  . Colon cancer Mother   . Cancer Father        Liver cancer  . Heart disease Maternal Grandmother   . Heart disease Maternal Grandfather     Past Surgical History:  Procedure Laterality Date  . ANAL  RECTAL MANOMETRY N/A 03/29/2014   Procedure: ANO RECTAL MANOMETRY;  Surgeon: [name], MD;  Location: [facility];  Service: Endoscopy;  Laterality: N/A;  . ANAL RECTAL MANOMETRY N/A 04/28/2014   Procedure: ANO RECTAL MANOMETRY;  Surgeon: [name], MD;  [facility];  Service: Endoscopy;  Laterality: N/A;  . CATARACT EXTRACTION W/ INTRAOCULAR LENS  IMPLANT, BILATERAL  1990's  . GLAUCOMA SURGERY Bilateral 2010   laser  . PATELLA FRACTURE SURGERY Left 1995  . SACRAL NERVE LEAD IMPLANTATION -- STAGE ONE INTERSTIM  [id number]   dr [name]   fecal incontinence  . STAGE TWO SACRAL NERVE STIMULATOR INTERSTIM  09-15-2014  . TOTAL ABDOMINAL HYSTERECTOMY W/ BILATERAL SALPINGOOPHORECTOMY  1990   Social History   Occupational History  . Not on file  Tobacco Use  . Smoking status: Never Smoker  . Smokeless tobacco: Never Used  Substance and Sexual Activity  . Alcohol use: Not Currently    Alcohol/week: 1.0 standard drink    Types: 1 Standard drinks or equivalent per week    Comment: Occas  . Drug use: No  . Sexual activity: Not on file

## 2019-12-01 NOTE — Progress Notes (Signed)
Subjective: Patient is here for ultrasound-guided intra-articular bilateral glenohumeral injection.  She was referred for the right, but asked for both.  Objective:  Pain with overhead reach.  Procedure: Ultrasound-guided bilateral glenohumeral injection: After sterile prep with Betadine, injected 8 cc 1% lidocaine without epinephrine and 40 mg methylprednisolone using a 22-gauge spinal needle, passing the needle from posterior approach into the glenohumeral joint.  Modest immediate relief.

## 2019-12-09 ENCOUNTER — Ambulatory Visit: Payer: Medicare PPO | Admitting: Podiatry

## 2019-12-09 ENCOUNTER — Encounter: Payer: Self-pay | Admitting: Podiatry

## 2019-12-09 ENCOUNTER — Other Ambulatory Visit: Payer: Self-pay

## 2019-12-09 DIAGNOSIS — B351 Tinea unguium: Secondary | ICD-10-CM | POA: Diagnosis not present

## 2019-12-09 NOTE — Progress Notes (Signed)
This patient returns to the office for evaluation and treatment of long thick painful nail second toe right foot. .  This patient is unable to trim his own nails since the patient cannot reach the feet.  Patient says the nail is  painful walking and wearing her shoes.  He returns for preventive foot care services.  General Appearance  Alert, conversant and in no acute stress.  Vascular  Dorsalis pedis and posterior tibial  pulses are palpable  bilaterally.  Capillary return is within normal limits  bilaterally. Temperature is within normal limits  bilaterally.  Neurologic  Senn-Weinstein monofilament wire test within normal limits  bilaterally. Muscle power within normal limits bilaterally.  Nails Thick disfigured discolored nails with subungual debris  second toenail right foot. Pincer nails second right.   No evidence of bacterial infection or drainage bilaterally.  Orthopedic  No limitations of motion  feet .  No crepitus or effusions noted.  No bony pathology or digital deformities noted.  Skin  normotropic skin with no porokeratosis noted bilaterally.  No signs of infections or ulcers noted.     Onychomycosis  Pain in toes right foot   Debridement  of nail second toe nail right foot. with a nail nipper.  Nails were then filed using a dremel tool with no incidents.    RTC  3 months   Gardiner Barefoot DPM

## 2020-01-07 ENCOUNTER — Ambulatory Visit
Admission: RE | Admit: 2020-01-07 | Discharge: 2020-01-07 | Disposition: A | Discharge status: Home / Self Care | Payer: Medicare PPO | Source: Ambulatory Visit | Attending: Orthopaedic Surgery | Admitting: Orthopaedic Surgery

## 2020-01-07 ENCOUNTER — Other Ambulatory Visit: Payer: Self-pay

## 2020-01-07 DIAGNOSIS — M4807 Spinal stenosis, lumbosacral region: Secondary | ICD-10-CM

## 2020-01-07 NOTE — Progress Notes (Signed)
Needs appt.  Thanks.

## 2020-02-02 ENCOUNTER — Ambulatory Visit: Payer: Medicare PPO | Admitting: Orthopaedic Surgery

## 2020-02-02 ENCOUNTER — Encounter: Payer: Self-pay | Admitting: Orthopaedic Surgery

## 2020-02-02 DIAGNOSIS — M4807 Spinal stenosis, lumbosacral region: Secondary | ICD-10-CM

## 2020-02-02 NOTE — Progress Notes (Signed)
Office Visit Note   Patient: Janet Cooper           Date of Birth: 1929/07/21           MRN: [id number] Visit Date: 02/02/2020              Requested by: Aletha Halim., PA-C [address]  [place] [address] PCP: Aletha Halim., PA-C   Assessment & Plan: Visit Diagnoses: No diagnosis found.  Plan: Impression is chronic lower back pain, left shoulder rotator cuff tendinitis and right shoulder glenohumeral osteoarthritis. In regards to her back, we will now refer her to Dr. Ernestina Patches for [facility]. In regards to her left shoulder, we will obtain an MRI to assess for structural abnormalities. In regards to her right shoulder, it is too early to repeat cortisone injection. Follow-up with [place] after the MRI of the left shoulder.  Follow-Up Instructions: Return for after MRI.   Orders:  No orders of the defined types were placed in this encounter.  No orders of the defined types were placed in this encounter.     Procedures: No procedures performed   Clinical Data: No additional findings.   Subjective: Chief Complaint  Patient presents with   Lower Back - Pain    HPI patient is a very pleasant 86 year old female who comes in today to discuss MRI results of lumbar spine. She has been dealing with chronic lower back pain for a while now. She has tried steroids, muscle relaxers and physical therapy all without significant relief of symptoms. Recent MRI shows anterolisthesis L2-3, L3-4 and L4-5. She also has bilateral facet arthropathy L3-4 with bulging disc. She has stenosis of both lateral recesses end of the intervertebral foreman on the left that could affect the L3 nerve. L4-5 bilateral facet arthropathy with bulging of the disc more towards the right. Stenosis of the lateral recess right more than left. Right foraminal stenosis that could affect the right L4 nerve. L5-S1 bulging disc and facet degeneration and hypertrophy right worse than left. Right foraminal  stenosis affecting the right L5 nerve. She is also complaining of recurrent bilateral shoulder pain left greater than right. We have injected the left subacromial space with cortisone in the right glenohumeral space with cortisone which provided approximately 50% relief of symptoms. Her pain has returned and is starting to worsen once again.  Review of Systems as detailed in HPI. All others reviewed and are negative.   Objective: Vital Signs: There were no vitals taken for this visit.  Physical Exam well-developed well-nourished female no acute distress. Alert oriented x3.  Ortho Exam stable lumbar exam. Stable bilateral shoulder exam.  Specialty Comments:  No specialty comments available.  Imaging: No new imaging   PMFS History: Patient Active Problem List   Diagnosis Date Noted   Onychomycosis of toenail 06/11/2019   BPV (benign positional vertigo), left 02/10/2019   Hearing loss of left ear 02/10/2019   Nausea 02/10/2019   Rectocele 09/16/2013   Past Medical History:  Diagnosis Date   Arthritis    back   Chronic cystitis    Detrusor instability of bladder    Fecal incontinence    Glaucoma of both eyes    History of endometrial cancer    1990 dx  Stage IB, Grade 1 endometrial carcinoma  s/p TAH w/ BSO--  recurrence 1994 vaginal cuff  s/p radiation   History of radiation therapy    1994  lower pelvic area for recurrent endometrial cancer  at vaginal cuff   Hyperlipidemia    Hypertension    OAB (overactive bladder)    Osteopenia 05/2013   T score -2.1 FRAX 8.9%/2.5%   Urge urinary incontinence    Wears glasses     Family History  Problem Relation Age of Onset   Colon cancer Mother    Cancer Father        Liver cancer   Heart disease Maternal Grandmother    Heart disease Maternal Grandfather     Past Surgical History:  Procedure Laterality Date   ANAL RECTAL MANOMETRY N/A 03/29/2014   Procedure: ANO RECTAL MANOMETRY;  Surgeon: [name], MD;  Location: [facility];  Service: Endoscopy;  Laterality: N/A;   ANAL RECTAL MANOMETRY N/A 04/28/2014   Procedure: ANO RECTAL MANOMETRY;  Surgeon: [name], MD;  Location: [facility];  Service: Endoscopy;  Laterality: N/A;   CATARACT EXTRACTION W/ INTRAOCULAR LENS  IMPLANT, BILATERAL  1990's   GLAUCOMA SURGERY Bilateral 2010   laser   PATELLA FRACTURE SURGERY Left 1995   SACRAL NERVE LEAD IMPLANTATION -- STAGE ONE INTERSTIM  [id number]   dr [name]   fecal incontinence   STAGE TWO SACRAL NERVE STIMULATOR INTERSTIM  09-15-2014   TOTAL ABDOMINAL HYSTERECTOMY W/ BILATERAL SALPINGOOPHORECTOMY  1990   Social History   Occupational History   Not on file  Tobacco Use   Smoking status: Never Smoker   Smokeless tobacco: Never Used  Substance and Sexual Activity   Alcohol use: Not Currently    Alcohol/week: 1.0 standard drink    Types: 1 Standard drinks or equivalent per week    Comment: Occas   Drug use: No   Sexual activity: Not on file

## 2020-02-03 ENCOUNTER — Other Ambulatory Visit: Payer: Self-pay

## 2020-02-03 DIAGNOSIS — M545 Low back pain, unspecified: Secondary | ICD-10-CM

## 2020-02-03 DIAGNOSIS — G8929 Other chronic pain: Secondary | ICD-10-CM

## 2020-02-03 DIAGNOSIS — M25512 Pain in left shoulder: Secondary | ICD-10-CM

## 2020-02-15 ENCOUNTER — Telehealth: Payer: Self-pay | Admitting: Orthopaedic Surgery

## 2020-02-15 NOTE — Telephone Encounter (Signed)
Called patient left message to return call to schedule an appointment with Dr Erlinda Hong after MRI     MRI scheduled for 02/28/2020

## 2020-02-22 ENCOUNTER — Ambulatory Visit: Payer: Self-pay

## 2020-02-22 ENCOUNTER — Ambulatory Visit: Payer: Medicare PPO | Admitting: Physical Medicine and Rehabilitation

## 2020-02-22 ENCOUNTER — Other Ambulatory Visit: Payer: Self-pay

## 2020-02-22 ENCOUNTER — Encounter: Payer: Self-pay | Admitting: Physical Medicine and Rehabilitation

## 2020-02-22 VITALS — BP 129/80 | HR 75

## 2020-02-22 DIAGNOSIS — M48061 Spinal stenosis, lumbar region without neurogenic claudication: Secondary | ICD-10-CM

## 2020-02-22 DIAGNOSIS — M5416 Radiculopathy, lumbar region: Secondary | ICD-10-CM

## 2020-02-22 DIAGNOSIS — M4316 Spondylolisthesis, lumbar region: Secondary | ICD-10-CM

## 2020-02-22 MED ORDER — METHYLPREDNISOLONE ACETATE 80 MG/ML IJ SUSP
80.0000 mg | Freq: Once | INTRAMUSCULAR | Status: AC
  Administered 2020-02-22: 80 mg

## 2020-02-22 NOTE — Progress Notes (Signed)
Pt state lower back pain. Pt state standing and bending over makes the pain worse. Pt state sitting helps with sum of the pain.  Numeric Pain Rating Scale and Functional Assessment Average Pain 0   In the last MONTH (on 0-10 scale) has pain interfered with the following?  1. General activity like being  able to carry out your everyday physical activities such as walking, climbing stairs, carrying groceries, or moving a chair?  Rating(8)   +Driver, -BT, -Dye Allergies.

## 2020-02-23 ENCOUNTER — Telehealth: Payer: Self-pay | Admitting: Physical Medicine and Rehabilitation

## 2020-02-23 NOTE — Telephone Encounter (Signed)
Needs auth for bilateral L4-5 and L5-S1 facets. Patient is scheduled for 10/11.

## 2020-02-24 NOTE — Telephone Encounter (Signed)
Pt has been approve for [id number] Auth# [id number]

## 2020-02-27 ENCOUNTER — Other Ambulatory Visit: Payer: Medicare PPO

## 2020-02-28 ENCOUNTER — Ambulatory Visit
Admission: RE | Admit: 2020-02-28 | Discharge: 2020-02-28 | Disposition: A | Discharge status: Home / Self Care | Payer: Medicare PPO | Source: Ambulatory Visit | Attending: Orthopaedic Surgery | Admitting: Orthopaedic Surgery

## 2020-02-28 DIAGNOSIS — G8929 Other chronic pain: Secondary | ICD-10-CM

## 2020-02-29 NOTE — Procedures (Signed)
Lumbar Epidural Steroid Injection - Interlaminar Approach with Fluoroscopic Guidance  Patient: Janet Cooper      Date of Birth: 08-Feb-1930 MRN: [id number] PCP: Aletha Halim., PA-C      Visit Date: 02/22/2020   Universal Protocol:     Consent Given By: the patient  Position: PRONE  Additional Comments: Vital signs were monitored before and after the procedure. Patient was prepped and draped in the usual sterile fashion. The correct patient, procedure, and site was verified.   Injection Procedure Details:  Procedure Site One Meds Administered:  Meds ordered this encounter  Medications  . methylPREDNISolone acetate (DEPO-MEDROL) injection 80 mg     Laterality: Right  Location/Site:  L4-L5  Needle size: 20 G  Needle type: Tuohy  Needle Placement: Paramedian epidural  Findings:   -Comments: Excellent flow of contrast into the epidural space.  Procedure Details: Using a paramedian approach from the side mentioned above, the region overlying the inferior lamina was localized under fluoroscopic visualization and the soft tissues overlying this structure were infiltrated with 4 ml. of 1% Lidocaine without Epinephrine. The Tuohy needle was inserted into the epidural space using a paramedian approach.   The epidural space was localized using loss of resistance along with lateral and bi-planar fluoroscopic views.  After negative aspirate for air, blood, and CSF, a 2 ml. volume of Isovue-250 was injected into the epidural space and the flow of contrast was observed. Radiographs were obtained for documentation purposes.    The injectate was administered into the level noted above.   Additional Comments:  The patient tolerated the procedure well Dressing: 2 x 2 sterile gauze and Band-Aid    Post-procedure details: Patient was observed during the procedure. Post-procedure instructions were reviewed.  Patient left the clinic in stable condition.

## 2020-02-29 NOTE — Progress Notes (Signed)
Janet Cooper - 86 y.o. female MRN [id number]  Date of birth: [birth year]  Office Visit Note: Visit Date: 02/22/2020 PCP: Aletha Halim., PA-C Referred by: Aletha Halim., PA-C  Subjective: No chief complaint on file.  HPI: Janet Cooper is a 86 y.o. female who comes in today at the request of Dr. Eduard Roux for planned Right L4-L5 Lumbar epidural steroid injection with fluoroscopic guidance.  The patient has failed conservative care including home exercise, medications, time and activity modification.  This injection will be diagnostic and hopefully therapeutic.  Please see requesting physician notes for further details and justification.  MRI reviewed with images and spine model.  MRI reviewed in the note below.  MRI shows multilevel changes with some scoliosis some degenerative listhesis no high-grade central stenosis but lateral recess narrowing.  Depending on relief with epidural injection could look at diagnostic medial branch blocks of the mid to lower facet joints.  Could look at potential for radiofrequency ablation in the future.   ROS Otherwise per HPI.  Assessment & Plan: Visit Diagnoses:  1. Lumbar radiculopathy   2. Bilateral stenosis of lateral recess of lumbar spine   3. Spondylolisthesis of lumbar region     Plan: No additional findings.   Meds & Orders:  Meds ordered this encounter  Medications  . methylPREDNISolone acetate (DEPO-MEDROL) injection 80 mg    Orders Placed This Encounter  Procedures  . XR C-ARM NO REPORT  . Epidural Steroid injection    Follow-up: Return if symptoms worsen or fail to improve.   Procedures: No procedures performed  Lumbar Epidural Steroid Injection - Interlaminar Approach with Fluoroscopic Guidance  Patient: Janet Cooper      Date of Birth: July 08, 1929 MRN: [id number] PCP: Aletha Halim., PA-C      Visit Date: 02/22/2020   Universal Protocol:     Consent Given By: the patient  Position:  PRONE  Additional Comments: Vital signs were monitored before and after the procedure. Patient was prepped and draped in the usual sterile fashion. The correct patient, procedure, and site was verified.   Injection Procedure Details:  Procedure Site One Meds Administered:  Meds ordered this encounter  Medications  . methylPREDNISolone acetate (DEPO-MEDROL) injection 80 mg     Laterality: Right  Location/Site:  L4-L5  Needle size: 20 G  Needle type: Tuohy  Needle Placement: Paramedian epidural  Findings:   -Comments: Excellent flow of contrast into the epidural space.  Procedure Details: Using a paramedian approach from the side mentioned above, the region overlying the inferior lamina was localized under fluoroscopic visualization and the soft tissues overlying this structure were infiltrated with 4 ml. of 1% Lidocaine without Epinephrine. The Tuohy needle was inserted into the epidural space using a paramedian approach.   The epidural space was localized using loss of resistance along with lateral and bi-planar fluoroscopic views.  After negative aspirate for air, blood, and CSF, a 2 ml. volume of Isovue-250 was injected into the epidural space and the flow of contrast was observed. Radiographs were obtained for documentation purposes.    The injectate was administered into the level noted above.   Additional Comments:  The patient tolerated the procedure well Dressing: 2 x 2 sterile gauze and Band-Aid    Post-procedure details: Patient was observed during the procedure. Post-procedure instructions were reviewed.  Patient left the clinic in stable condition.     Clinical History: Low back pain over the last 15 years. History of ovarian cancer.  EXAM: MRI LUMBAR SPINE WITHOUT CONTRAST  TECHNIQUE: Multiplanar, multisequence MR imaging of the lumbar spine was performed. No intravenous contrast was administered.  COMPARISON:  Lumbar MRI  03/18/2010.  FINDINGS: Segmentation:  5 lumbar type vertebral bodies assumed.  Alignment: Curvature convex to the right with the apex at L2-3. degenerative anterolisthesis at L3-4 measuring 5 mm and at L4-5 measuring 4 mm.  Vertebrae: No fracture or primary bone lesion. Some discogenic endplate edematous changes on the right at L4-5 and L5-S1 could contribute to low back pain.  Conus medullaris and cauda equina: Conus extends to the L1 level. Conus and cauda equina appear normal.  Paraspinal and other soft tissues: Very large right renal cysts, the largest measuring up 2 14 mm in diameter, not primarily or completely evaluated. These were present at least to a degree in October of 2011.  Disc levels:  Mild non-compressive disc bulges at L1-2 and L2-3.  L3-4: Bilateral facet arthropathy with 5 mm of anterolisthesis. Mild bulging of the disc. Mild stenosis of both lateral recesses and of the intervertebral foramen on the left. Some potential the left L3 nerve could be compressed.  L4-5: Bilateral facet arthropathy with 4 mm of anterolisthesis. Bulging of the disc more towards the right. Bilateral lateral recess stenosis worse on the right. Right foraminal stenosis that could affect the right L4 nerve.  L5-S1: Mild bulging of the disc. Facet degeneration and hypertrophy right worse than left. Right foraminal stenosis could affect the right L5 nerve.  Compared to the study of 2011, there is slight worsening of the left foraminal stenosis at L3-4. Slight worsening of the anterolisthesis and right foraminal stenosis at L4-5.  IMPRESSION: 1. Curvature convex to the right with the apex at L2-3. 5 mm of anterolisthesis at L3-4 and 4 mm of anterolisthesis L4-5. 2. L3-4: Bilateral facet arthropathy with 5 mm of anterolisthesis. Bulging of the disc. Stenosis of both lateral recesses and of the intervertebral foramen on the left that could affect the left L3 nerve.  Slight worsening since 2011. 3. L4-5: Bilateral facet arthropathy with 4 mm of anterolisthesis. Bulging of the disc more towards the right. Stenosis of the lateral recesses right more than left. Right foraminal stenosis that could affect the right L4 nerve. Slight worsening since 2011. 4. L5-S1: Bulging of the disc. Facet degeneration and hypertrophy right worse than left. Right foraminal stenosis that could affect the right L5 nerve. 5. Very large cysts in the right retroperitoneum an abdomen presumed to be right renal cysts. These were present in 2011 but appear slightly larger. These were not primarily or completely evaluated.   Electronically Signed   By: Nelson Chimes M.D.   On: 01/07/2020 15:17   She reports that she has never smoked. She has never used smokeless tobacco. No results for input(s): HGBA1C, LABURIC in the last 8760 hours.  Objective:  VS:  HT:    WT:   BMI:     BP:129/80  HR:75bpm  TEMP: ( )  RESP:  Physical Exam Constitutional:      General: She is not in acute distress.    Appearance: Normal appearance. She is not ill-appearing.  HENT:     Head: Normocephalic and atraumatic.     Right Ear: External ear normal.     Left Ear: External ear normal.  Eyes:     Extraocular Movements: Extraocular movements intact.  Cardiovascular:     Rate and Rhythm: Normal rate.     Pulses: Normal pulses.  Musculoskeletal:  Right lower leg: No edema.     Left lower leg: No edema.     Comments: Patient has good distal strength with no pain over the greater trochanters.  No clonus or focal weakness.  Skin:    Findings: No erythema, lesion or rash.  Neurological:     General: No focal deficit present.     Mental Status: She is alert and oriented to person, place, and time.     Sensory: No sensory deficit.     Motor: No weakness or abnormal muscle tone.     Coordination: Coordination normal.  Psychiatric:        Mood and Affect: Mood normal.        Behavior:  Behavior normal.     Ortho Exam  Imaging: No results found.  Past Medical/Family/Surgical/Social History: Medications & Allergies reviewed per EMR, new medications updated. Patient Active Problem List   Diagnosis Date Noted  . Onychomycosis of toenail 06/11/2019  . BPV (benign positional vertigo), left 02/10/2019  . Hearing loss of left ear 02/10/2019  . Nausea 02/10/2019  . Rectocele 09/16/2013   Past Medical History:  Diagnosis Date  . Arthritis    back  . Chronic cystitis   . Detrusor instability of bladder   . Fecal incontinence   . Glaucoma of both eyes   . History of endometrial cancer    1990 dx  Stage IB, Grade 1 endometrial carcinoma  s/p TAH w/ BSO--  recurrence 1994 vaginal cuff  s/p radiation  . History of radiation therapy    1994  lower pelvic area for recurrent endometrial cancer at vaginal cuff  . Hyperlipidemia   . Hypertension   . OAB (overactive bladder)   . Osteopenia 05/2013   T score -2.1 FRAX 8.9%/2.5%  . Urge urinary incontinence   . Wears glasses    Family History  Problem Relation Age of Onset  . Colon cancer Mother   . Cancer Father        Liver cancer  . Heart disease Maternal Grandmother   . Heart disease Maternal Grandfather    Past Surgical History:  Procedure Laterality Date  . ANAL RECTAL MANOMETRY N/A 03/29/2014   Procedure: ANO RECTAL MANOMETRY;  Surgeon: [name], MD;  Location: [facility];  Service: Endoscopy;  Laterality: N/A;  . ANAL RECTAL MANOMETRY N/A 04/28/2014   Procedure: ANO RECTAL MANOMETRY;  Surgeon: [name], MD;  Location: [facility];  Service: Endoscopy;  Laterality: N/A;  . CATARACT EXTRACTION W/ INTRAOCULAR LENS  IMPLANT, BILATERAL  1990's  . GLAUCOMA SURGERY Bilateral 2010   laser  . PATELLA FRACTURE SURGERY Left 1995  . SACRAL NERVE LEAD IMPLANTATION -- STAGE ONE INTERSTIM  [id number]   dr [name]   fecal incontinence  . STAGE TWO SACRAL NERVE STIMULATOR INTERSTIM  09-15-2014  . TOTAL  ABDOMINAL HYSTERECTOMY W/ BILATERAL SALPINGOOPHORECTOMY  1990   Social History   Occupational History  . Not on file  Tobacco Use  . Smoking status: Never Smoker  . Smokeless tobacco: Never Used  Substance and Sexual Activity  . Alcohol use: Not Currently    Alcohol/week: 1.0 standard drink    Types: 1 Standard drinks or equivalent per week    Comment: Occas  . Drug use: No  . Sexual activity: Not on file

## 2020-03-08 ENCOUNTER — Ambulatory Visit: Payer: Medicare PPO | Admitting: Orthopaedic Surgery

## 2020-03-08 ENCOUNTER — Encounter: Payer: Self-pay | Admitting: Orthopaedic Surgery

## 2020-03-08 ENCOUNTER — Ambulatory Visit: Payer: Self-pay

## 2020-03-08 DIAGNOSIS — M25511 Pain in right shoulder: Secondary | ICD-10-CM | POA: Diagnosis not present

## 2020-03-08 DIAGNOSIS — M25512 Pain in left shoulder: Secondary | ICD-10-CM | POA: Diagnosis not present

## 2020-03-08 DIAGNOSIS — G8929 Other chronic pain: Secondary | ICD-10-CM | POA: Diagnosis not present

## 2020-03-08 NOTE — Progress Notes (Signed)
Subjective: Patient is here for ultrasound-guided intra-articular bilateral glenohumeral injection.  Pain from DJD.  Objective:  Decreased overhead reach.  Procedure: Ultrasound-guided bilateral glenohumeral injection: After sterile prep with Betadine, injected 8 cc 1% lidocaine without epinephrine and 40 mg methylprednisolone using a 22-gauge spinal needle, passing the needle from posterior approach into the glenohumeral joint.  If this provides only short-term relief, could contemplate dextrose prolotherapy.

## 2020-03-08 NOTE — Progress Notes (Signed)
Office Visit Note   Patient: Janet Cooper           Date of Birth: [birth year]           MRN: [id number] Visit Date: 03/08/2020              Requested by: Aletha Halim., PA-C [address],  [place] [address] PCP: Aletha Halim., PA-C   Assessment & Plan: Visit Diagnoses:  1. Chronic pain of both shoulders     Plan: Impression is chronic bilateral shoulder pain left greater than right.  At this point, we will refer her back to Dr. Junius Roads for ultrasound-guided cortisone injections to both glenohumeral joint.  She will follow up with [place] as needed.  Follow-Up Instructions: Return if symptoms worsen or fail to improve.   Orders:  Orders Placed This Encounter  Procedures   US Guided Needle Placement - No Linked Charges   No orders of the defined types were placed in this encounter.     Procedures: No procedures performed   Clinical Data: No additional findings.   Subjective: Chief Complaint  Patient presents with   Left Shoulder - Pain    HPI patient is a pleasant 86 year old female who comes in today to discuss MRI results of her left shoulder.  She has been dealing with chronic bilateral shoulder pain left greater than right for the past several months.  She has tried subacromial cortisone injections as well as glenohumeral cortisone injections with only temporary relief of symptoms.  She does remember her subacromial injections by me and she states these helped only 5 days.  She does not remember having glenohumeral injections by Dr. Junius Roads but there are notes for these.  The pain she has is worse with activity as well as lying on either side.  She does not like to take medication for pain and is not interested in surgery.  MRI of the left shoulder shows moderate rotator cuff tendinopathy in glenohumeral osteoarthritis with degenerative tearing of the labrum.     Objective: Vital Signs: There were no vitals taken for this visit.    Ortho  Exam stable shoulder exam  Specialty Comments:  No specialty comments available.  Imaging: No results found.   PMFS History: Patient Active Problem List   Diagnosis Date Noted   Onychomycosis of toenail 06/11/2019   BPV (benign positional vertigo), left 02/10/2019   Hearing loss of left ear 02/10/2019   Nausea 02/10/2019   Rectocele 09/16/2013   Past Medical History:  Diagnosis Date   Arthritis    back   Chronic cystitis    Detrusor instability of bladder    Fecal incontinence    Glaucoma of both eyes    History of endometrial cancer    1990 dx  Stage IB, Grade 1 endometrial carcinoma  s/p TAH w/ BSO--  recurrence 1994 vaginal cuff  s/p radiation   History of radiation therapy    1994  lower pelvic area for recurrent endometrial cancer at vaginal cuff   Hyperlipidemia    Hypertension    OAB (overactive bladder)    Osteopenia 05/2013   T score -2.1 FRAX 8.9%/2.5%   Urge urinary incontinence    Wears glasses     Family History  Problem Relation Age of Onset   Colon cancer Mother    Cancer Father        Liver cancer   Heart disease Maternal Grandmother    Heart disease Maternal Grandfather  Past Surgical History:  Procedure Laterality Date   ANAL RECTAL MANOMETRY N/A 03/29/2014   Procedure: ANO RECTAL MANOMETRY;  Surgeon: [name], MD;  Location: [facility];  Service: Endoscopy;  Laterality: N/A;   ANAL RECTAL MANOMETRY N/A 04/28/2014   Procedure: ANO RECTAL MANOMETRY;  Surgeon: [name], MD;  Location: [facility];  Service: Endoscopy;  Laterality: N/A;   CATARACT EXTRACTION W/ INTRAOCULAR LENS  IMPLANT, BILATERAL  1990's   GLAUCOMA SURGERY Bilateral 2010   laser   PATELLA FRACTURE SURGERY Left 1995   SACRAL NERVE LEAD IMPLANTATION -- STAGE ONE INTERSTIM  [id number]   dr [name]   fecal incontinence   STAGE TWO SACRAL NERVE STIMULATOR INTERSTIM  09-15-2014   TOTAL ABDOMINAL HYSTERECTOMY W/ BILATERAL  SALPINGOOPHORECTOMY  1990   Social History   Occupational History   Not on file  Tobacco Use   Smoking status: Never Smoker   Smokeless tobacco: Never Used  Substance and Sexual Activity   Alcohol use: Not Currently    Alcohol/week: 1.0 standard drink    Types: 1 Standard drinks or equivalent per week    Comment: Occas   Drug use: No   Sexual activity: Not on file

## 2020-03-14 ENCOUNTER — Other Ambulatory Visit: Payer: Self-pay

## 2020-03-14 ENCOUNTER — Encounter: Payer: Self-pay | Admitting: Physical Medicine and Rehabilitation

## 2020-03-14 ENCOUNTER — Ambulatory Visit: Payer: Medicare PPO | Admitting: Physical Medicine and Rehabilitation

## 2020-03-14 ENCOUNTER — Ambulatory Visit: Payer: Self-pay

## 2020-03-14 VITALS — BP 128/81 | HR 66

## 2020-03-14 DIAGNOSIS — M47816 Spondylosis without myelopathy or radiculopathy, lumbar region: Secondary | ICD-10-CM | POA: Diagnosis not present

## 2020-03-14 MED ORDER — METHYLPREDNISOLONE ACETATE 80 MG/ML IJ SUSP
80.0000 mg | Freq: Once | INTRAMUSCULAR | Status: AC
  Administered 2020-03-14: 80 mg

## 2020-03-14 NOTE — Progress Notes (Signed)
Pt state lower back pain. Pt state standing for period of time and lifting things off the floor makes the pain worse. Pt state she has to sit down to rest to ease the pain. Pt has hx of inj on 02/22/20 Pt state that it didn't work work, after three days the pain return.   Numeric Pain Rating Scale and Functional Assessment Average Pain 0   In the last MONTH (on 0-10 scale) has pain interfered with the following?  1. General activity like being  able to carry out your everyday physical activities such as walking, climbing stairs, carrying groceries, or moving a chair?  Rating(10)   +Driver, -BT, -Dye Allergies.

## 2020-03-14 NOTE — Progress Notes (Signed)
BARBARITA HUTMACHER - 86 y.o. female MRN [id number]  Date of birth: [birth year]  Office Visit Note: Visit Date: 03/14/2020 PCP: Aletha Halim., PA-C Referred by: Aletha Halim., PA-C  Subjective: Chief Complaint  Patient presents with  . Lower Back - Pain   HPI:  RUBYANN LINGLE is a 86 y.o. female who comes in today at the request of Dr. Eduard Roux for planned Bilateral L3-L4 and L4-L5 Lumbar facet/medial branch block with fluoroscopic guidance.  The patient has failed conservative care including home exercise, medications, time and activity modification.  This injection will be diagnostic and hopefully therapeutic.  Please see requesting physician notes for further details and justification.  Exam has shown concordant pain with facet joint loading.  MRI reviewed with images and spine model.  MRI reviewed in the note below. Prior epidural injection was not very beneficial.  We will complete diagnostic facet/medial branch blocks today.   ROS Otherwise per HPI.  Assessment & Plan: Visit Diagnoses:  1. Spondylosis without myelopathy or radiculopathy, lumbar region     Plan: No additional findings.   Meds & Orders:  Meds ordered this encounter  Medications  . methylPREDNISolone acetate (DEPO-MEDROL) injection 80 mg    Orders Placed This Encounter  Procedures  . Facet Injection  . XR C-ARM NO REPORT    Follow-up: No follow-ups on file.   Procedures: No procedures performed  Lumbar Diagnostic Facet Joint Nerve Block with Fluoroscopic Guidance   Patient: SHAWNTINA DIFFEE      Date of Birth: June 13, 1929 MRN: [id number] PCP: Aletha Halim., PA-C      Visit Date: 03/14/2020   Universal Protocol:    Date/Time: 10/25/215:23 AM  Consent Given By: the patient  Position: PRONE  Additional Comments: Vital signs were monitored before and after the procedure. Patient was prepped and draped in the usual sterile fashion. The correct patient, procedure, and site  was verified.   Injection Procedure Details:   Procedure diagnoses:  1. Spondylosis without myelopathy or radiculopathy, lumbar region      Meds Administered:  Meds ordered this encounter  Medications  . methylPREDNISolone acetate (DEPO-MEDROL) injection 80 mg     Laterality: Bilateral  Location/Site:  L3-L4 L4-L5  Needle size: 22 ga.  Needle type:spinal  Needle Placement: Oblique pedical  Findings:   -Comments: There was excellent flow of contrast along the articular pillars without intravascular flow.  Procedure Details: The fluoroscope beam is vertically oriented in AP and then obliqued 15 to 20 degrees to the ipsilateral side of the desired nerve to achieve the "Scotty dog" appearance.  The skin over the target area of the junction of the superior articulating process and the transverse process (sacral ala if blocking the L5 dorsal rami) was locally anesthetized with a 1 ml volume of 1% Lidocaine without Epinephrine.  The spinal needle was inserted and advanced in a trajectory view down to the target.   After contact with periosteum and negative aspirate for blood and CSF, correct placement without intravascular or epidural spread was confirmed by injecting 0.5 ml. of Isovue-250.  A spot radiograph was obtained of this image.    Next, a 0.5 ml. volume of the injectate described above was injected. The needle was then redirected to the other facet joint nerves mentioned above if needed.  Prior to the procedure, the patient was given a Pain Diary which was completed for baseline measurements.  After the procedure, the patient rated their pain every 30 minutes and will  continue rating at this frequency for a total of 5 hours.  The patient has been asked to complete the Diary and return to [place] by mail, fax or hand delivered as soon as possible.   Additional Comments:  The patient tolerated the procedure well Dressing: 2 x 2 sterile gauze and Band-Aid    Post-procedure  details: Patient was observed during the procedure. Post-procedure instructions were reviewed.  Patient left the clinic in stable condition.     Clinical History: Low back pain over the last 15 years. History of ovarian cancer.  EXAM: MRI LUMBAR SPINE WITHOUT CONTRAST  TECHNIQUE: Multiplanar, multisequence MR imaging of the lumbar spine was performed. No intravenous contrast was administered.  COMPARISON:  Lumbar MRI 03/18/2010.  FINDINGS: Segmentation:  5 lumbar type vertebral bodies assumed.  Alignment: Curvature convex to the right with the apex at L2-3. degenerative anterolisthesis at L3-4 measuring 5 mm and at L4-5 measuring 4 mm.  Vertebrae: No fracture or primary bone lesion. Some discogenic endplate edematous changes on the right at L4-5 and L5-S1 could contribute to low back pain.  Conus medullaris and cauda equina: Conus extends to the L1 level. Conus and cauda equina appear normal.  Paraspinal and other soft tissues: Very large right renal cysts, the largest measuring up 2 14 mm in diameter, not primarily or completely evaluated. These were present at least to a degree in October of 2011.  Disc levels:  Mild non-compressive disc bulges at L1-2 and L2-3.  L3-4: Bilateral facet arthropathy with 5 mm of anterolisthesis. Mild bulging of the disc. Mild stenosis of both lateral recesses and of the intervertebral foramen on the left. Some potential the left L3 nerve could be compressed.  L4-5: Bilateral facet arthropathy with 4 mm of anterolisthesis. Bulging of the disc more towards the right. Bilateral lateral recess stenosis worse on the right. Right foraminal stenosis that could affect the right L4 nerve.  L5-S1: Mild bulging of the disc. Facet degeneration and hypertrophy right worse than left. Right foraminal stenosis could affect the right L5 nerve.  Compared to the study of 2011, there is slight worsening of the left foraminal  stenosis at L3-4. Slight worsening of the anterolisthesis and right foraminal stenosis at L4-5.  IMPRESSION: 1. Curvature convex to the right with the apex at L2-3. 5 mm of anterolisthesis at L3-4 and 4 mm of anterolisthesis L4-5. 2. L3-4: Bilateral facet arthropathy with 5 mm of anterolisthesis. Bulging of the disc. Stenosis of both lateral recesses and of the intervertebral foramen on the left that could affect the left L3 nerve. Slight worsening since 2011. 3. L4-5: Bilateral facet arthropathy with 4 mm of anterolisthesis. Bulging of the disc more towards the right. Stenosis of the lateral recesses right more than left. Right foraminal stenosis that could affect the right L4 nerve. Slight worsening since 2011. 4. L5-S1: Bulging of the disc. Facet degeneration and hypertrophy right worse than left. Right foraminal stenosis that could affect the right L5 nerve. 5. Very large cysts in the right retroperitoneum an abdomen presumed to be right renal cysts. These were present in 2011 but appear slightly larger. These were not primarily or completely evaluated.   Electronically Signed   By: Nelson Chimes M.D.   On: 01/07/2020 15:17     Objective:  VS:  HT:    WT:   BMI:     BP:128/81  HR:66bpm  TEMP: ( )  RESP:  Physical Exam Constitutional:      General: She is not  in acute distress.    Appearance: Normal appearance. She is not ill-appearing.  HENT:     Head: Normocephalic and atraumatic.     Right Ear: External ear normal.     Left Ear: External ear normal.  Eyes:     Extraocular Movements: Extraocular movements intact.  Cardiovascular:     Rate and Rhythm: Normal rate.     Pulses: Normal pulses.  Musculoskeletal:     Right lower leg: No edema.     Left lower leg: No edema.     Comments: Patient has good distal strength with no pain over the greater trochanters.  No clonus or focal weakness. Patient somewhat slow to rise from a seated position to full extension.   There is concordant low back pain with facet loading and lumbar spine extension rotation.  There are no definitive trigger points but the patient is somewhat tender across the lower back and PSIS.  There is no pain with hip rotation.   Skin:    Findings: No erythema, lesion or rash.  Neurological:     General: No focal deficit present.     Mental Status: She is alert and oriented to person, place, and time.     Sensory: No sensory deficit.     Motor: No weakness or abnormal muscle tone.     Coordination: Coordination normal.  Psychiatric:        Mood and Affect: Mood normal.        Behavior: Behavior normal.      Imaging: No results found.

## 2020-03-22 ENCOUNTER — Other Ambulatory Visit: Payer: Self-pay

## 2020-03-22 ENCOUNTER — Ambulatory Visit: Payer: Medicare PPO | Admitting: Podiatry

## 2020-03-22 ENCOUNTER — Encounter: Payer: Self-pay | Admitting: Podiatry

## 2020-03-22 DIAGNOSIS — L600 Ingrowing nail: Secondary | ICD-10-CM

## 2020-03-22 DIAGNOSIS — B351 Tinea unguium: Secondary | ICD-10-CM | POA: Insufficient documentation

## 2020-03-22 NOTE — Progress Notes (Signed)
This patient returns to the office for evaluation and treatment of long thick painful nails second and fourth toes right foot..Patient has ingrown nail first left foot.   .  This patient is unable to trim his own nails since the patient cannot reach the feet.  Patient says the nails are   painful walking and wearing her shoes.  She returns for preventive foot care services.  General Appearance  Alert, conversant and in no acute stress.  Vascular  Dorsalis pedis and posterior tibial  pulses are palpable  bilaterally.  Capillary return is within normal limits  bilaterally. Temperature is within normal limits  bilaterally.  Neurologic  Senn-Weinstein monofilament wire test within normal limits  bilaterally. Muscle power within normal limits bilaterally.  Nails Thick disfigured discolored nails with subungual debris  second toenail right foot. Pincer nails second and fourth nails  Right  Foot..   No evidence of bacterial infection or drainage bilaterally.  Orthopedic  No limitations of motion  feet .  No crepitus or effusions noted.  No bony pathology or digital deformities noted.  Skin  normotropic skin with no porokeratosis noted bilaterally.  No signs of infections or ulcers noted.     Onychomycosis  Pain in toes right foot   Debridement  of nail second and fourth toenails right foot. with a nail nipper.  Nails were then filed using a dremel tool with no incidents.    RTC  10 weeks    Gardiner Barefoot DPM

## 2020-03-28 NOTE — Procedures (Signed)
Lumbar Diagnostic Facet Joint Nerve Block with Fluoroscopic Guidance   Patient: Janet Cooper      Date of Birth: 26-May-1930 MRN: [id number] PCP: Aletha Halim., PA-C      Visit Date: 03/14/2020   Universal Protocol:    Date/Time: 10/25/215:23 AM  Consent Given By: the patient  Position: PRONE  Additional Comments: Vital signs were monitored before and after the procedure. Patient was prepped and draped in the usual sterile fashion. The correct patient, procedure, and site was verified.   Injection Procedure Details:   Procedure diagnoses:  1. Spondylosis without myelopathy or radiculopathy, lumbar region      Meds Administered:  Meds ordered this encounter  Medications  . methylPREDNISolone acetate (DEPO-MEDROL) injection 80 mg     Laterality: Bilateral  Location/Site:  L3-L4 L4-L5  Needle size: 22 ga.  Needle type:spinal  Needle Placement: Oblique pedical  Findings:   -Comments: There was excellent flow of contrast along the articular pillars without intravascular flow.  Procedure Details: The fluoroscope beam is vertically oriented in AP and then obliqued 15 to 20 degrees to the ipsilateral side of the desired nerve to achieve the "Scotty dog" appearance.  The skin over the target area of the junction of the superior articulating process and the transverse process (sacral ala if blocking the L5 dorsal rami) was locally anesthetized with a 1 ml volume of 1% Lidocaine without Epinephrine.  The spinal needle was inserted and advanced in a trajectory view down to the target.   After contact with periosteum and negative aspirate for blood and CSF, correct placement without intravascular or epidural spread was confirmed by injecting 0.5 ml. of Isovue-250.  A spot radiograph was obtained of this image.    Next, a 0.5 ml. volume of the injectate described above was injected. The needle was then redirected to the other facet joint nerves mentioned above if  needed.  Prior to the procedure, the patient was given a Pain Diary which was completed for baseline measurements.  After the procedure, the patient rated their pain every 30 minutes and will continue rating at this frequency for a total of 5 hours.  The patient has been asked to complete the Diary and return to [place] by mail, fax or hand delivered as soon as possible.   Additional Comments:  The patient tolerated the procedure well Dressing: 2 x 2 sterile gauze and Band-Aid    Post-procedure details: Patient was observed during the procedure. Post-procedure instructions were reviewed.  Patient left the clinic in stable condition.

## 2020-04-13 ENCOUNTER — Telehealth: Payer: Self-pay | Admitting: Physical Medicine and Rehabilitation

## 2020-04-13 NOTE — Telephone Encounter (Signed)
If helped but not lasting then repeat for block #2, I can talk about RFA at that time

## 2020-04-13 NOTE — Telephone Encounter (Signed)
Patient had bilateral L3-4 and L4-5 facets on 03/14/20. Ok to repeat if helped, same problem/side, and no new injury?

## 2020-04-13 NOTE — Telephone Encounter (Signed)
Patient called requesting a call back to set a back injection appt. Patient states to please call after 3:30 pm she has another appt. Patient phone number is [phone].

## 2020-04-13 NOTE — Telephone Encounter (Signed)
Patient states that she had no relief at all, not even for a short time. Please advise.

## 2020-04-14 NOTE — Telephone Encounter (Signed)
Called patient and scheduled for OV with Dr. Erlinda Hong.

## 2020-04-14 NOTE — Telephone Encounter (Signed)
Unfortunately, both epidural and facet with no effect at all, can f/up with Dr. Erlinda Hong for orthopedic eval vs pain mgt or spine surgery eval

## 2020-05-03 ENCOUNTER — Other Ambulatory Visit: Payer: Self-pay

## 2020-05-03 ENCOUNTER — Encounter: Payer: Self-pay | Admitting: Orthopaedic Surgery

## 2020-05-03 ENCOUNTER — Ambulatory Visit: Payer: Self-pay

## 2020-05-03 ENCOUNTER — Ambulatory Visit: Payer: Medicare PPO | Admitting: Orthopaedic Surgery

## 2020-05-03 ENCOUNTER — Ambulatory Visit (INDEPENDENT_AMBULATORY_CARE_PROVIDER_SITE_OTHER): Payer: Medicare PPO

## 2020-05-03 DIAGNOSIS — M19011 Primary osteoarthritis, right shoulder: Secondary | ICD-10-CM | POA: Diagnosis not present

## 2020-05-03 DIAGNOSIS — M545 Low back pain, unspecified: Secondary | ICD-10-CM

## 2020-05-03 DIAGNOSIS — M19012 Primary osteoarthritis, left shoulder: Secondary | ICD-10-CM | POA: Diagnosis not present

## 2020-05-03 DIAGNOSIS — M25562 Pain in left knee: Secondary | ICD-10-CM

## 2020-05-03 DIAGNOSIS — G8929 Other chronic pain: Secondary | ICD-10-CM

## 2020-05-03 MED ORDER — BUPIVACAINE HCL 0.25 % IJ SOLN
2.0000 mL | INTRAMUSCULAR | Status: AC | PRN
  Administered 2020-05-03: 2 mL via INTRA_ARTICULAR

## 2020-05-03 MED ORDER — LIDOCAINE HCL 1 % IJ SOLN
2.0000 mL | INTRAMUSCULAR | Status: AC | PRN
  Administered 2020-05-03: 2 mL

## 2020-05-03 MED ORDER — METHYLPREDNISOLONE ACETATE 40 MG/ML IJ SUSP
40.0000 mg | INTRAMUSCULAR | Status: AC | PRN
  Administered 2020-05-03: 40 mg via INTRA_ARTICULAR

## 2020-05-03 NOTE — Progress Notes (Signed)
Subjective: Patient is here for ultrasound-guided intra-articular bilateral glenohumeral injection.  Cortisone helped for about 10 days, but the relief was only short-lived.  Objective: Pain in both shoulders with overhead reach.  Procedure: Ultrasound guided injection is preferred based studies that show increased duration, increased effect, greater accuracy, decreased procedural pain, increased response rate, and decreased cost with ultrasound guided versus blind injection.   Verbal informed consent obtained.  Time-out conducted.  Noted no overlying erythema, induration, or other signs of local infection. Ultrasound-guided bilateral glenohumeral injection: After sterile prep with Betadine, injected 6 cc 1% lidocaine without epinephrine and 4 cc 50% dextrose using a 22-gauge spinal needle, passing the needle through approach into the glenohumeral joint.  Injected half of the above solution into each shoulder.  We can repeat this if needed.

## 2020-05-03 NOTE — Progress Notes (Signed)
Office Visit Note   Patient: Janet Cooper           Date of Birth: [birth year]           MRN: [id number] Visit Date: 05/03/2020              Requested by: Aletha Halim., PA-C [address]  [place] [address] PCP: Aletha Halim., PA-C   Assessment & Plan: Visit Diagnoses:  1. Left knee pain, unspecified chronicity   2. Primary osteoarthritis, left shoulder   3. Chronic bilateral low back pain without sciatica     Plan: Impression is chronic lower back pain, left shoulder glenohumeral degenerative osteoarthritis and left knee reactive synovitis.  In regards to the lower back, we have made referral to Dr. Kathyrn Sheriff at [facility].  In regards to left shoulder, we have referred her to Dr. Junius Roads for dextrose cryotherapy injection.  In regards to her left knee, I aspirated approximately 15 cc of serosanguineous fluid and then injected this with cortisone.  She will follow up with [place] as needed.  Follow-Up Instructions: Return if symptoms worsen or fail to improve.   Orders:  Orders Placed This Encounter  Procedures  . Large Joint Inj: L knee  . XR KNEE 3 VIEW LEFT  . US Guided Needle Placement - No Linked Charges  . Ambulatory referral to Neurosurgery   No orders of the defined types were placed in this encounter.     Procedures: Large Joint Inj: L knee on 05/03/2020 2:20 PM Indications: pain Details: 22 G needle, anterolateral approach Medications: 2 mL lidocaine 1 %; 2 mL bupivacaine 0.25 %; 40 mg methylPREDNISolone acetate 40 MG/ML      Clinical Data: No additional findings.   Subjective: Chief Complaint  Patient presents with  . Lower Back - Pain  . Left Shoulder - Pain    HPI patient is a pleasant 86 year old female who comes in today with chronic low back pain and left shoulder pain in addition to new injury to her left knee.  In regards to her lower back, she has had pain here for years.  She recently had facet blocks at L3-4 and L4-5 with Dr.  Ernestina Patches without relief of symptoms even temporarily.  In regards to her left shoulder, she has a history of rotator cuff tendinitis and glenohumeral degenerative osteoarthritis.  She has had both subacromial and glenohumeral injections with only temporary relief.  In regards to the left knee, she had a new injury which she sustained a fall onto the anterior knee the day before Thanksgiving.  She has had pain with ambulation since.  She does note a remote history of surgical intervention to the right knee Many years ago.  Review of Systems as detailed in HPI.  All others reviewed and are negative.   Objective: Vital Signs: There were no vitals taken for this visit.  Physical Exam well-developed well-nourished female no acute distress.  Alert oriented x3.  Ortho Exam stable back and left shoulder exams.  Left knee exam shows a 1+ effusion.  Range of motion 0 to 100 degrees.  She does have marked ecchymosis.  Diffuse tenderness throughout both joint lines and the patella.  Ligaments are stable.  She is neurovascular intact distally.  Specialty Comments:  No specialty comments available.  Imaging: XR KNEE 3 VIEW LEFT  Result Date: 05/03/2020 No acute or structural abnormalities    PMFS History: Patient Active Problem List   Diagnosis Date Noted  .  Onychomycosis with ingrown toenail 03/22/2020  . Onychomycosis of toenail 06/11/2019  . BPV (benign positional vertigo), left 02/10/2019  . Hearing loss of left ear 02/10/2019  . Nausea 02/10/2019  . Rectocele 09/16/2013   Past Medical History:  Diagnosis Date  . Arthritis    back  . Chronic cystitis   . Detrusor instability of bladder   . Fecal incontinence   . Glaucoma of both eyes   . History of endometrial cancer    1990 dx  Stage IB, Grade 1 endometrial carcinoma  s/p TAH w/ BSO--  recurrence 1994 vaginal cuff  s/p radiation  . History of radiation therapy    1994  lower pelvic area for recurrent endometrial cancer at vaginal  cuff  . Hyperlipidemia   . Hypertension   . OAB (overactive bladder)   . Osteopenia 05/2013   T score -2.1 FRAX 8.9%/2.5%  . Urge urinary incontinence   . Wears glasses     Family History  Problem Relation Age of Onset  . Colon cancer Mother   . Cancer Father        Liver cancer  . Heart disease Maternal Grandmother   . Heart disease Maternal Grandfather     Past Surgical History:  Procedure Laterality Date  . ANAL RECTAL MANOMETRY N/A 03/29/2014   Procedure: ANO RECTAL MANOMETRY;  Surgeon: [name], MD;  Location: [facility];  Service: Endoscopy;  Laterality: N/A;  . ANAL RECTAL MANOMETRY N/A 04/28/2014   Procedure: ANO RECTAL MANOMETRY;  Surgeon: [name], MD;  Location: [facility];  Service: Endoscopy;  Laterality: N/A;  . CATARACT EXTRACTION W/ INTRAOCULAR LENS  IMPLANT, BILATERAL  1990's  . GLAUCOMA SURGERY Bilateral 2010   laser  . PATELLA FRACTURE SURGERY Left 1995  . SACRAL NERVE LEAD IMPLANTATION -- STAGE ONE INTERSTIM  [id number]   dr [name]   fecal incontinence  . STAGE TWO SACRAL NERVE STIMULATOR INTERSTIM  09-15-2014  . TOTAL ABDOMINAL HYSTERECTOMY W/ BILATERAL SALPINGOOPHORECTOMY  1990   Social History   Occupational History  . Not on file  Tobacco Use  . Smoking status: Never Smoker  . Smokeless tobacco: Never Used  Substance and Sexual Activity  . Alcohol use: Not Currently    Alcohol/week: 1.0 standard drink    Types: 1 Standard drinks or equivalent per week    Comment: Occas  . Drug use: No  . Sexual activity: Not on file

## 2020-05-20 DIAGNOSIS — G8929 Other chronic pain: Secondary | ICD-10-CM | POA: Insufficient documentation

## 2020-05-20 DIAGNOSIS — M4150 Other secondary scoliosis, site unspecified: Secondary | ICD-10-CM | POA: Insufficient documentation

## 2020-05-20 DIAGNOSIS — M4180 Other forms of scoliosis, site unspecified: Secondary | ICD-10-CM | POA: Insufficient documentation

## 2020-05-24 ENCOUNTER — Telehealth: Payer: Self-pay | Admitting: Orthopaedic Surgery

## 2020-05-24 NOTE — Telephone Encounter (Signed)
Fine by me 

## 2020-05-24 NOTE — Telephone Encounter (Signed)
S/w paitent. Appt scheduled to see Dr Erlinda Hong

## 2020-05-24 NOTE — Telephone Encounter (Signed)
Patient called asking if she can set appt with Dr. Junius Roads. Patient has been seen by Dr. Erlinda Hong several times. Please call patient to set appt with Dr. Junius Roads for bilateral shoulder pains

## 2020-05-24 NOTE — Telephone Encounter (Signed)
Please advise. Patient just injected by Dr Junius Roads on 11/30

## 2020-06-01 ENCOUNTER — Encounter: Payer: Self-pay | Admitting: Podiatry

## 2020-06-01 ENCOUNTER — Ambulatory Visit: Payer: Medicare PPO | Admitting: Podiatry

## 2020-06-01 ENCOUNTER — Other Ambulatory Visit: Payer: Self-pay

## 2020-06-01 DIAGNOSIS — B351 Tinea unguium: Secondary | ICD-10-CM | POA: Diagnosis not present

## 2020-06-01 DIAGNOSIS — L600 Ingrowing nail: Secondary | ICD-10-CM

## 2020-06-01 NOTE — Progress Notes (Signed)
This patient returns to the office for evaluation and treatment of long thick painful nails second right foot..Patient has ingrown nail first right  foot.   .  This patient is unable to trim her own nails since the patient cannot reach the feet.  Patient says the nails are   painful walking and wearing her shoes.  She returns for preventive foot care services.  General Appearance  Alert, conversant and in no acute stress.  Vascular  Dorsalis pedis and posterior tibial  pulses are palpable  bilaterally.  Capillary return is within normal limits  bilaterally. Temperature is within normal limits  bilaterally.  Neurologic  Senn-Weinstein monofilament wire test within normal limits  bilaterally. Muscle power within normal limits bilaterally.  Nails Thick disfigured discolored nails with subungual debris  second toenail right foot. Pincer nails second Right  Foot..   No evidence of bacterial infection or drainage bilaterally. Ontchomycosis with marked incurvation medial border right hallux.  No redness or swelling or pain.  Orthopedic  No limitations of motion  feet .  No crepitus or effusions noted.  No bony pathology or digital deformities noted.  Skin  normotropic skin with no porokeratosis noted bilaterally.  No signs of infections or ulcers noted.     Onychomycosis  Pain in toes right foot   Debridement  of nail second and fourth toenails right foot. with a nail nipper.  Nails were then filed using a dremel tool with no incidents.    RTC  10 weeks    Gardiner Barefoot DPM

## 2020-06-06 ENCOUNTER — Ambulatory Visit: Payer: Medicare PPO | Admitting: Family Medicine

## 2020-06-08 ENCOUNTER — Other Ambulatory Visit: Payer: Self-pay

## 2020-06-08 ENCOUNTER — Ambulatory Visit: Payer: Self-pay

## 2020-06-08 ENCOUNTER — Encounter: Payer: Self-pay | Admitting: Family Medicine

## 2020-06-08 ENCOUNTER — Ambulatory Visit: Payer: Medicare PPO | Admitting: Family Medicine

## 2020-06-08 ENCOUNTER — Telehealth: Payer: Self-pay | Admitting: Family Medicine

## 2020-06-08 DIAGNOSIS — M19012 Primary osteoarthritis, left shoulder: Secondary | ICD-10-CM | POA: Diagnosis not present

## 2020-06-08 DIAGNOSIS — M19011 Primary osteoarthritis, right shoulder: Secondary | ICD-10-CM

## 2020-06-08 NOTE — Progress Notes (Signed)
I saw and examined the patient with Dr. Marga Hoots and agree with assessment and plan as outlined.    Injections aren't helping much.  Will do subacromial injections today.  PT at [facility] in [place].  Request approval for gel injections.  If insurance denies, will do cash-pay gel injections.

## 2020-06-08 NOTE — Progress Notes (Signed)
Office Visit Note   Patient: Janet Cooper           Date of Birth: Nov 09, 1929           MRN: [id number] Visit Date: 06/08/2020 Requested by: Richmond Campbell., PA-C [address]  [place] [address] PCP: Richmond Campbell., PA-C  Subjective: Chief Complaint  Patient presents with  . Right Shoulder - Follow-up, Pain    No relief from last injections, bilaterally. Much pain in upper arms. Difficulty sleeping (is a side sleeper).  . Left Shoulder - Follow-up, Pain    HPI: 87yo F presenting to clinic to follow up on bilateral shoulder pain. Patient has a known history of OA and RC tendinopathy, who is presenting to clinic with the same. Patient states that previous Dextrose injections offered minimal/no benefit. She wants to know what else she can do, if anything. She is scared to go to PT, as she has bowel incontinence, and doesn't want to risk embarrassment in a PT clinic. She says the most effective shot was her initial subacromial steroid injection, but even this only lasted for a few days. She wants to know if there are any other available injections that may benefit her. She lives alone, with no assistance, and thus does not want to consider surgery.               ROS:   All other systems were reviewed and are negative.  Objective: Vital Signs: There were no vitals taken for this visit.  Physical Exam:  General:  Alert and oriented, in no acute distress. Pulm:  Breathing unlabored. Psy:  Normal mood, congruent affect. Skin:  Bilateral shoulders with no bruising, rashes, or erythema. Overlying skin intact.   Shoulder Exam:  Inspection: Symmetric muscle mass, no atrophy or deformity, no scars. Palpation: Endorses tenderness to palpation over and around the acromion, some tenderness over bilateral AC joints. No tenderness at biceps insertion. Full strength but significant pain with empty can (supraspinatus).  Load-and-shift without multidirectional  instability.  Strength testing:  5 out of 5 strength with shoulder abduction (C5), wrist extension (C6), wrist flexion (C7), grip strength (C8), and finger abduction (T1).  Sensation: Intact to light touch throughout bilateral upper extremities.   Brisk distal capillary refill.   Imaging: No results found.  Assessment & Plan: 87yo F presenting to clinic with concerns of ongoing bilateral shoulder pain, which has responded poorly in injection therapy thus far. Has tried both GH and Subacromial shoulder injections, and feels like, perhaps, the SA injections were more effective.  - Will try repeat Subacromial injections bilaterally today. Patient tolerated this well with no immediate complications.  - Given reassurance that PT will accommodate her, and are medial professionals, so she does not need to be so embarrassed. Patient states she would be willing to try. Referral placed for [facility].  - Will submit for Gel injections for shoulders, given failure of other modalities.  - RTC Pending PT, Injection approval.  - Patient expresses understanding, with no further questions or concerns.      Procedures: Bilateral Subacromial Cortisone Injection:  Risks and benefits of procedure discussed, Patient opted to proceed. Verbal Consent obtained.  Timeout performed.  Skin prepped in a sterile fashion with betadine before further cleansing with alcohol. Ethyl Chloride was used for topical analgesia.  Right subacromial area was injected with 5cc 0.25% Bupivocaine without epinephrine via the posterior approach using a 25G, 1.5in needle. Syringe was removed from the needle,  and 6mg  betamethasone was then injected into the area.  Procedure was then repeated on the left shoulder.  Patient tolerated the injection well with no immediate complications. Aftercare instructions were discussed, and patient was given strict return precautions.         PMFS History: Patient Active Problem List    Diagnosis Date Noted  . Onychomycosis with ingrown toenail 03/22/2020  . Onychomycosis of toenail 06/11/2019  . BPV (benign positional vertigo), left 02/10/2019  . Hearing loss of left ear 02/10/2019  . Nausea 02/10/2019  . Rectocele 09/16/2013   Past Medical History:  Diagnosis Date  . Arthritis    back  . Chronic cystitis   . Detrusor instability of bladder   . Fecal incontinence   . Glaucoma of both eyes   . History of endometrial cancer    1990 dx  Stage IB, Grade 1 endometrial carcinoma  s/p TAH w/ BSO--  recurrence 1994 vaginal cuff  s/p radiation  . History of radiation therapy    1994  lower pelvic area for recurrent endometrial cancer at vaginal cuff  . Hyperlipidemia   . Hypertension   . OAB (overactive bladder)   . Osteopenia 05/2013   T score -2.1 FRAX 8.9%/2.5%  . Urge urinary incontinence   . Wears glasses     Family History  Problem Relation Age of Onset  . Colon cancer Mother   . Cancer Father        Liver cancer  . Heart disease Maternal Grandmother   . Heart disease Maternal Grandfather     Past Surgical History:  Procedure Laterality Date  . ANAL RECTAL MANOMETRY N/A 03/29/2014   Procedure: ANO RECTAL MANOMETRY;  Surgeon: [name], MD;  Location: [facility];  Service: Endoscopy;  Laterality: N/A;  . ANAL RECTAL MANOMETRY N/A 04/28/2014   Procedure: ANO RECTAL MANOMETRY;  Surgeon: [name], MD;  Location: [facility];  Service: Endoscopy;  Laterality: N/A;  . CATARACT EXTRACTION W/ INTRAOCULAR LENS  IMPLANT, BILATERAL  1990's  . GLAUCOMA SURGERY Bilateral 2010   laser  . PATELLA FRACTURE SURGERY Left 1995  . SACRAL NERVE LEAD IMPLANTATION -- STAGE ONE INTERSTIM  [id number]   dr [name]   fecal incontinence  . STAGE TWO SACRAL NERVE STIMULATOR INTERSTIM  09-15-2014  . TOTAL ABDOMINAL HYSTERECTOMY W/ BILATERAL SALPINGOOPHORECTOMY  1990   Social History   Occupational History  . Not on file  Tobacco Use  . Smoking status: Never  Smoker  . Smokeless tobacco: Never Used  Substance and Sexual Activity  . Alcohol use: Not Currently    Alcohol/week: 1.0 standard drink    Types: 1 Standard drinks or equivalent per week    Comment: Occas  . Drug use: No  . Sexual activity: Not on file

## 2020-06-08 NOTE — Telephone Encounter (Signed)
Per Toniann Fail May, we no longer do gel injections for shoulders.

## 2020-06-08 NOTE — Telephone Encounter (Signed)
So can we do the cash pay gel injections for her shoulders?

## 2020-06-08 NOTE — Telephone Encounter (Signed)
Requesting approval for gel injections for shoulders.

## 2020-06-09 NOTE — Telephone Encounter (Signed)
Please tell pt that unfortunately, we can't do the gel injections in her shoulders.

## 2020-06-09 NOTE — Telephone Encounter (Signed)
Per rep gel injections only clinically indicated for knee OA.

## 2020-06-09 NOTE — Telephone Encounter (Signed)
Yes, PT for now.

## 2020-06-09 NOTE — Telephone Encounter (Signed)
Just try PT for now?

## 2020-06-09 NOTE — Telephone Encounter (Signed)
I called and left this info on the patient's mobile voice mail.

## 2020-08-10 ENCOUNTER — Ambulatory Visit: Payer: Medicare PPO | Admitting: Podiatry

## 2020-08-10 ENCOUNTER — Encounter: Payer: Self-pay | Admitting: Podiatry

## 2020-08-10 ENCOUNTER — Other Ambulatory Visit: Payer: Self-pay

## 2020-08-10 DIAGNOSIS — B351 Tinea unguium: Secondary | ICD-10-CM

## 2020-08-10 DIAGNOSIS — L600 Ingrowing nail: Secondary | ICD-10-CM

## 2020-08-10 DIAGNOSIS — M47816 Spondylosis without myelopathy or radiculopathy, lumbar region: Secondary | ICD-10-CM | POA: Insufficient documentation

## 2020-08-10 DIAGNOSIS — M419 Scoliosis, unspecified: Secondary | ICD-10-CM | POA: Insufficient documentation

## 2020-08-10 NOTE — Progress Notes (Signed)
This patient returns to the office for evaluation and treatment of long thick painful nails second right foot..Patient has ingrown nail first right  foot.   .  This patient is unable to trim her own nails since the patient cannot reach the feet.  Patient says the nails are   painful walking and wearing her shoes.  She returns for preventive foot care services.  General Appearance  Alert, conversant and in no acute stress.  Vascular  Dorsalis pedis and posterior tibial  pulses are palpable  bilaterally.  Capillary return is within normal limits  bilaterally. Temperature is within normal limits  bilaterally.  Neurologic  Senn-Weinstein monofilament wire test within normal limits  bilaterally. Muscle power within normal limits bilaterally.  Nails Thick disfigured discolored nails with subungual debris  second toenail right foot. Pincer nails second Right  Foot..   No evidence of bacterial infection or drainage bilaterally. Ontchomycosis with marked incurvation medial border right hallux.  No redness or swelling or pain.  Orthopedic  No limitations of motion  feet .  No crepitus or effusions noted.  No bony pathology or digital deformities noted.  Skin  normotropic skin with no porokeratosis noted bilaterally.  No signs of infections or ulcers noted.     Onychomycosis  Pain in toes right foot   Debridement  of nail second and fourth toenails right foot. with a nail nipper.  Nails were then filed using a dremel tool with no incidents.    RTC  10 weeks    Gardiner Barefoot DPM

## 2020-10-20 ENCOUNTER — Other Ambulatory Visit: Payer: Self-pay | Admitting: Family Medicine

## 2020-10-20 DIAGNOSIS — Z1231 Encounter for screening mammogram for malignant neoplasm of breast: Secondary | ICD-10-CM

## 2020-10-25 ENCOUNTER — Ambulatory Visit: Payer: Medicare PPO | Admitting: Podiatry

## 2020-10-25 ENCOUNTER — Encounter: Payer: Self-pay | Admitting: Podiatry

## 2020-10-25 ENCOUNTER — Other Ambulatory Visit: Payer: Self-pay

## 2020-10-25 DIAGNOSIS — B351 Tinea unguium: Secondary | ICD-10-CM

## 2020-10-25 NOTE — Progress Notes (Signed)
This patient returns to the office for evaluation and treatment of long thick painful nails second right foot..Patient has ingrown nail first right  foot.   .  This patient is unable to trim her own nails since the patient cannot reach the feet.  Patient says the nails are   painful walking and wearing her shoes.  She returns for preventive foot care services.  General Appearance  Alert, conversant and in no acute stress.  Vascular  Dorsalis pedis and posterior tibial  pulses are palpable  bilaterally.  Capillary return is within normal limits  bilaterally. Temperature is within normal limits  bilaterally.  Neurologic  Senn-Weinstein monofilament wire test within normal limits  bilaterally. Muscle power within normal limits bilaterally.  Nails Thick disfigured discolored nails with subungual debris  second toenail right foot. Pincer nails second Right  Foot..   No evidence of bacterial infection or drainage bilaterally. Ontchomycosis with marked incurvation medial border right hallux.  No redness or swelling or pain.  Orthopedic  No limitations of motion  feet .  No crepitus or effusions noted.  No bony pathology or digital deformities noted.  Skin  normotropic skin with no porokeratosis noted bilaterally.  No signs of infections or ulcers noted.     Onychomycosis  Pain in toes right foot   Debridement  of nail second and fourth toenails right foot. with a nail nipper.  Nails were then filed using a dremel tool with no incidents.    RTC  10 weeks    Gardiner Barefoot DPM

## 2020-12-19 ENCOUNTER — Other Ambulatory Visit: Payer: Self-pay

## 2020-12-19 ENCOUNTER — Ambulatory Visit
Admission: RE | Admit: 2020-12-19 | Discharge: 2020-12-19 | Disposition: A | Discharge status: Home / Self Care | Payer: Medicare PPO | Source: Ambulatory Visit | Attending: Family Medicine | Admitting: Family Medicine

## 2020-12-19 DIAGNOSIS — Z1231 Encounter for screening mammogram for malignant neoplasm of breast: Secondary | ICD-10-CM

## 2021-01-03 ENCOUNTER — Encounter: Payer: Self-pay | Admitting: Podiatry

## 2021-01-03 ENCOUNTER — Other Ambulatory Visit: Payer: Self-pay

## 2021-01-03 ENCOUNTER — Ambulatory Visit: Payer: Medicare PPO | Admitting: Podiatry

## 2021-01-03 DIAGNOSIS — L600 Ingrowing nail: Secondary | ICD-10-CM | POA: Diagnosis not present

## 2021-01-03 DIAGNOSIS — B351 Tinea unguium: Secondary | ICD-10-CM | POA: Diagnosis not present

## 2021-01-03 NOTE — Progress Notes (Signed)
This patient returns to the office for evaluation and treatment of long thick painful nails second right foot..Patient has ingrown nail first right  foot.   .  This patient is unable to trim her own nails since the patient cannot reach the feet.  Patient says the nails are   painful walking and wearing her shoes.  She returns for preventive foot care services.  General Appearance  Alert, conversant and in no acute stress.  Vascular  Dorsalis pedis and posterior tibial  pulses are palpable  bilaterally.  Capillary return is within normal limits  bilaterally. Temperature is within normal limits  bilaterally.  Neurologic  Senn-Weinstein monofilament wire test within normal limits  bilaterally. Muscle power within normal limits bilaterally.  Nails Thick disfigured discolored nails with subungual debris  second toenail right foot. Pincer nails second Right  Foot..   No evidence of bacterial infection or drainage bilaterally. Ontchomycosis with marked incurvation medial border right hallux.  No redness or swelling or pain.  Orthopedic  No limitations of motion  feet .  No crepitus or effusions noted.  No bony pathology or digital deformities noted.  Skin  normotropic skin with no porokeratosis noted bilaterally.  No signs of infections or ulcers noted.     Onychomycosis  Pain in toes right foot   Debridement  of nail second and fourth toenails right foot. with a nail nipper.  Nails were then filed using a dremel tool with no incidents.    RTC  10 weeks    Gardiner Barefoot DPM

## 2021-03-06 ENCOUNTER — Other Ambulatory Visit: Payer: Self-pay | Admitting: Family Medicine

## 2021-03-06 DIAGNOSIS — E2839 Other primary ovarian failure: Secondary | ICD-10-CM

## 2021-03-07 ENCOUNTER — Ambulatory Visit: Payer: Medicare PPO | Admitting: Podiatry

## 2021-03-07 ENCOUNTER — Encounter: Payer: Self-pay | Admitting: Podiatry

## 2021-03-07 ENCOUNTER — Other Ambulatory Visit: Payer: Self-pay

## 2021-03-07 DIAGNOSIS — L600 Ingrowing nail: Secondary | ICD-10-CM

## 2021-03-07 DIAGNOSIS — B351 Tinea unguium: Secondary | ICD-10-CM | POA: Diagnosis not present

## 2021-03-07 NOTE — Progress Notes (Signed)
This patient returns to the office for evaluation and treatment of long thick painful nails second right foot..Patient has ingrown nail first right  foot.   .  This patient is unable to trim her own nails since the patient cannot reach the feet.  Patient says the nails are   painful walking and wearing her shoes.  She returns for preventive foot care services.  General Appearance  Alert, conversant and in no acute stress.  Vascular  Dorsalis pedis and posterior tibial  pulses are palpable  bilaterally.  Capillary return is within normal limits  bilaterally. Temperature is within normal limits  bilaterally.  Neurologic  Senn-Weinstein monofilament wire test within normal limits  bilaterally. Muscle power within normal limits bilaterally.  Nails Thick disfigured discolored nails with subungual debris  second toenail right foot. Pincer nails second Right  Foot..   No evidence of bacterial infection or drainage bilaterally. Ontchomycosis with marked incurvation medial border right hallux.  No redness or swelling or pain.  Orthopedic  No limitations of motion  feet .  No crepitus or effusions noted.  No bony pathology or digital deformities noted.  Skin  normotropic skin with no porokeratosis noted bilaterally.  No signs of infections or ulcers noted.     Onychomycosis  Pain in toes right foot   Debridement  of nail second and fourth toenails right foot. with a nail nipper.  Nails were then filed using a dremel tool with no incidents.    RTC  10 weeks    Gardiner Barefoot DPM

## 2021-04-05 IMAGING — MG DIGITAL SCREENING BILATERAL MAMMOGRAM WITH TOMO AND CAD
8 series · 9 of 24 positions shown · non-contrast
Comparison: Previous exam(s).

CLINICAL DATA: Screening.

EXAM:
DIGITAL SCREENING BILATERAL MAMMOGRAM WITH TOMO AND CAD

[L CC synth-2D]
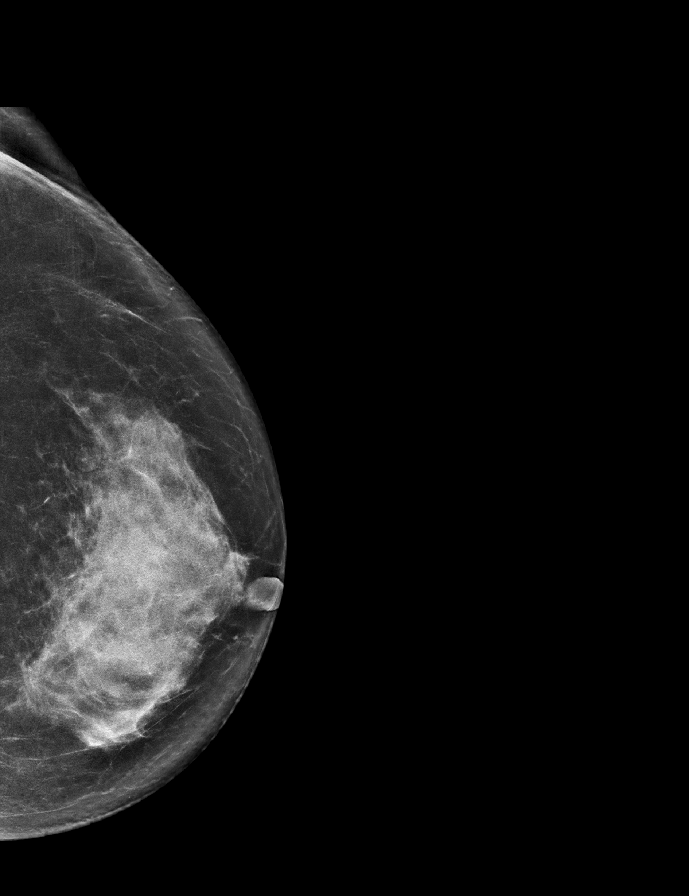

[R MLO synth-2D]
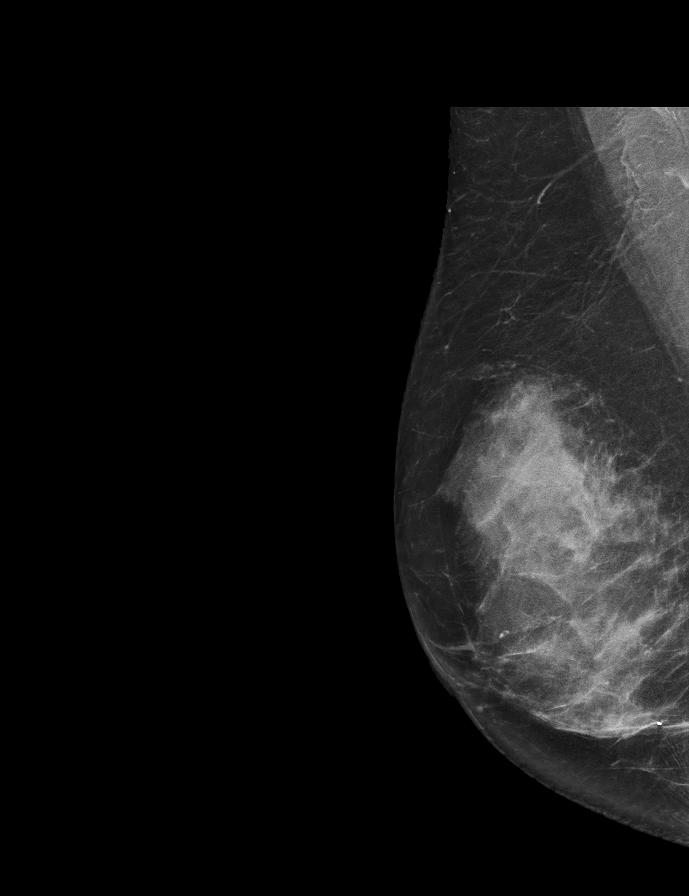

[R CC synth-2D]
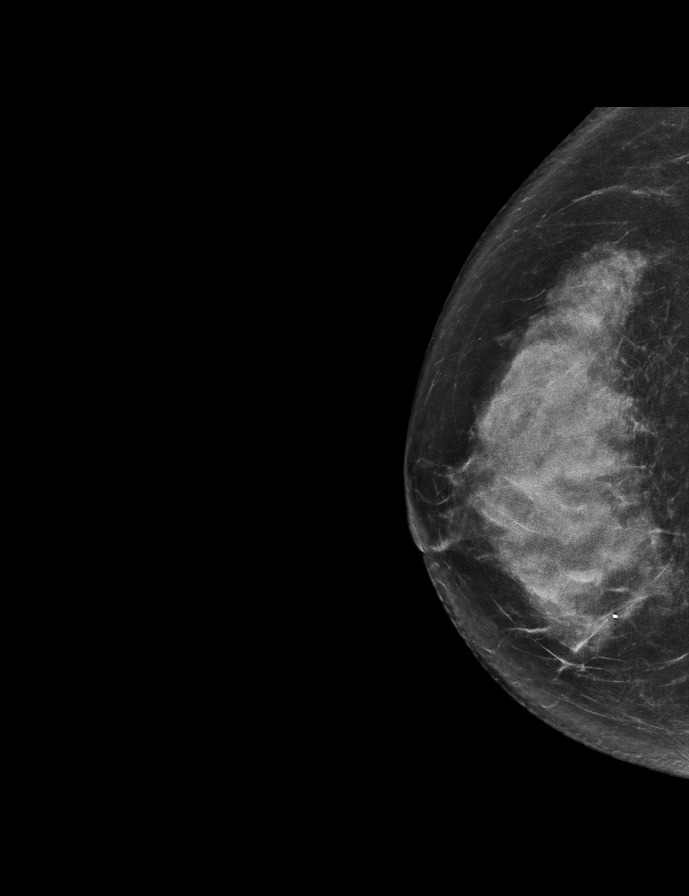

[L MLO synth-2D]
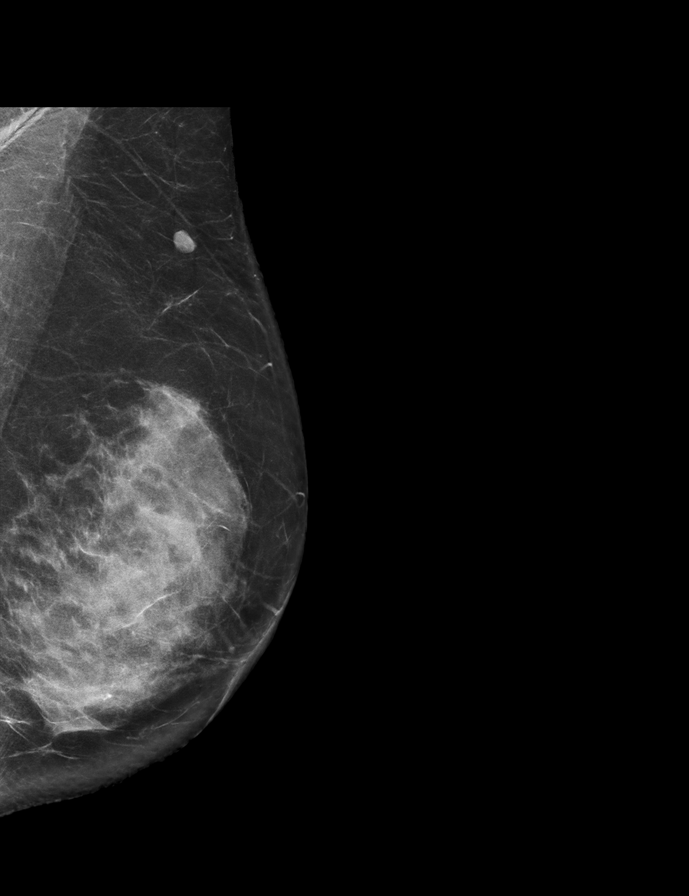

[R MLO tomo · 2 of 71 frames shown]
[frame 23/71]
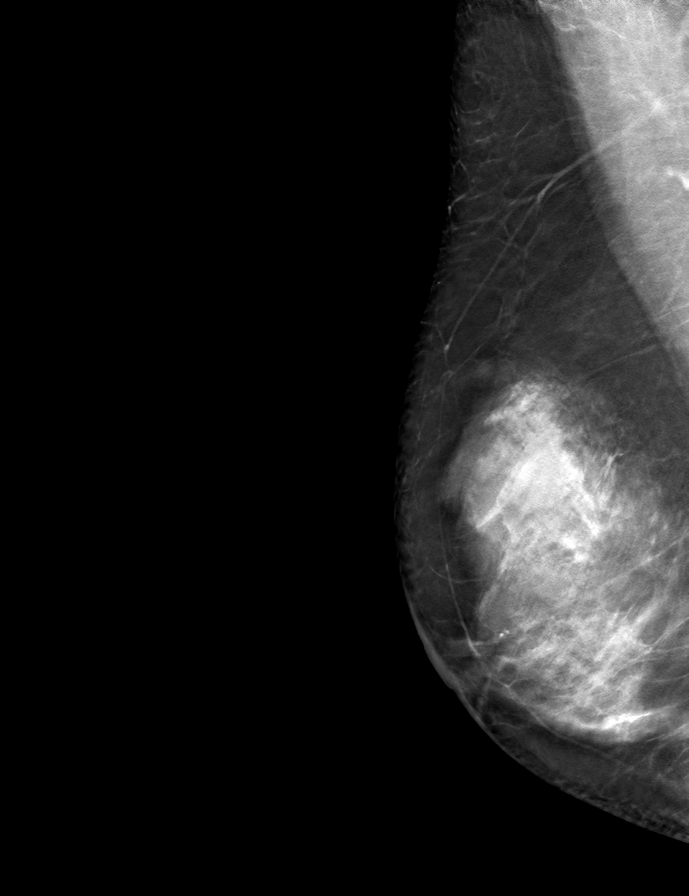
[frame 36/71]
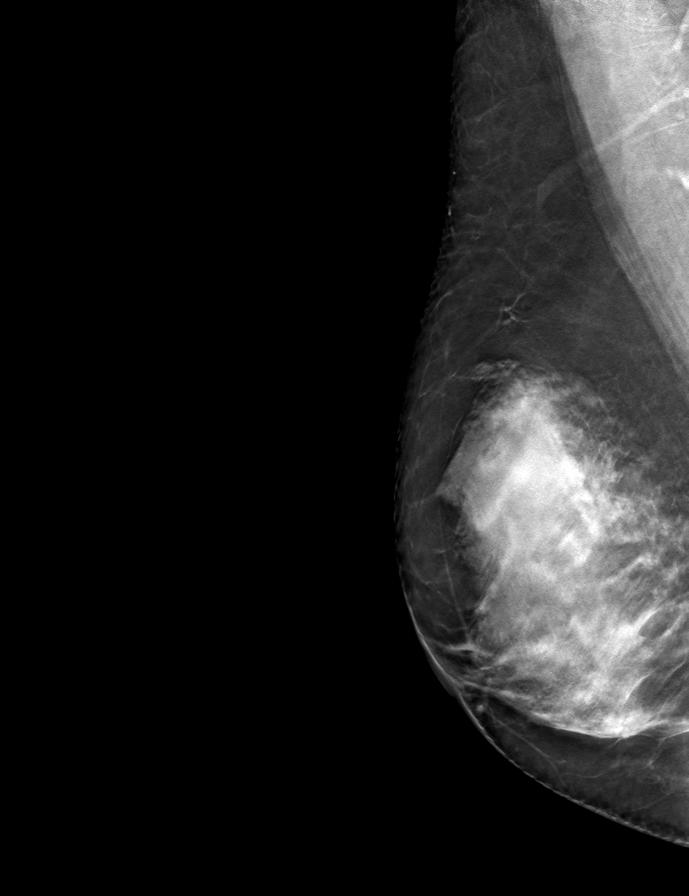

[L CC tomo · tomo slice 36/71.0]
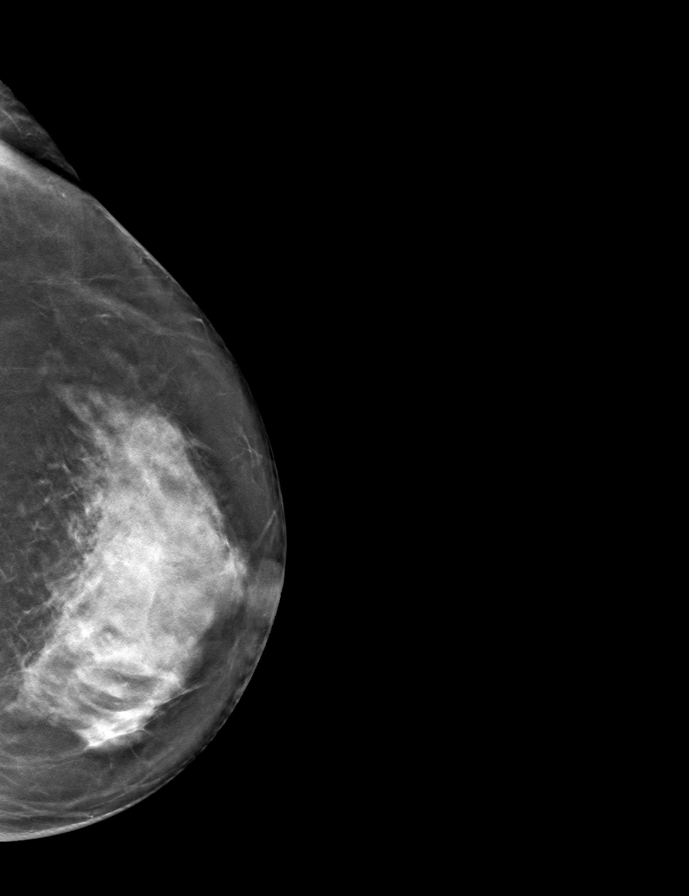

[R CC tomo · tomo slice 35/70.0]
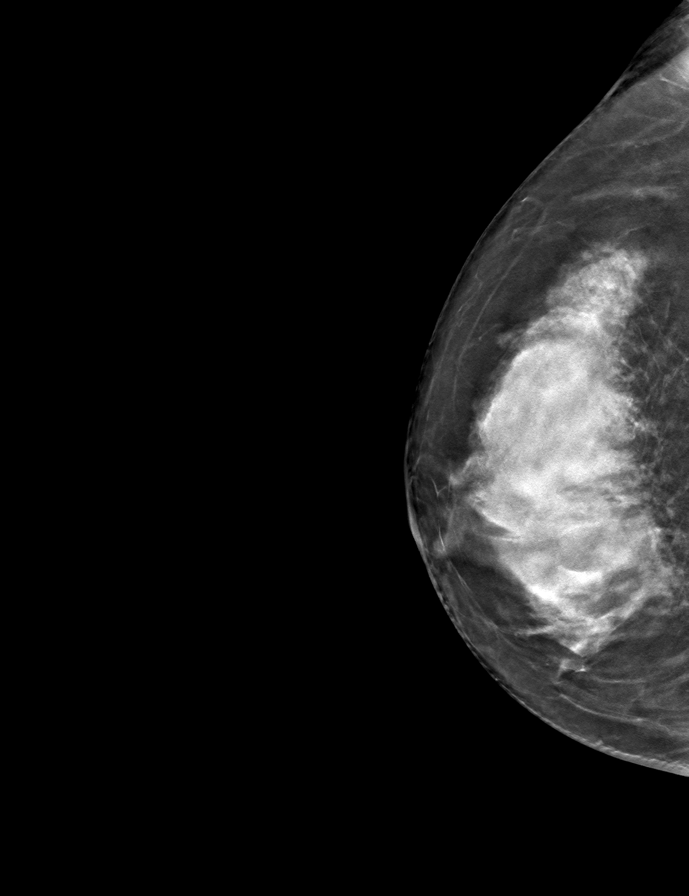

[L MLO tomo · tomo slice 36/71.0]
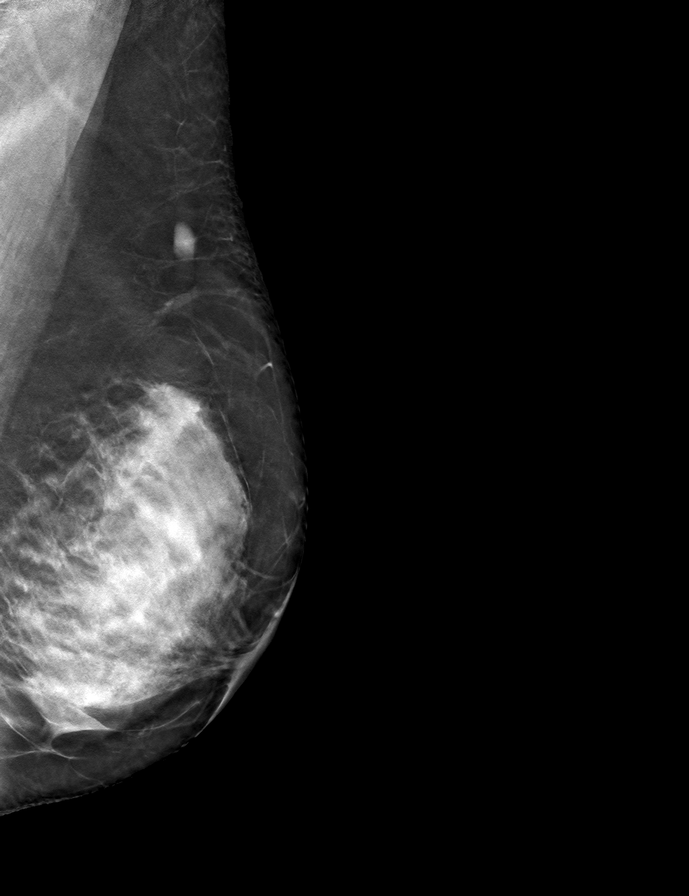

[9 of 24 positions shown; findings below may reference images not displayed]

ACR Breast Density Category d: The breast tissue is extremely dense,
which lowers the sensitivity of mammography
FINDINGS: There are no findings suspicious for malignancy. Images were
processed with CAD.
IMPRESSION: No mammographic evidence of malignancy. A result letter of this
screening mammogram will be mailed directly to the patient.

RECOMMENDATION:
Screening mammogram in one year. (Code:[id number]-[id number])

BI-RADS CATEGORY  1: Negative.

## 2021-05-22 ENCOUNTER — Encounter: Payer: Self-pay | Admitting: Podiatry

## 2021-05-22 ENCOUNTER — Other Ambulatory Visit: Payer: Self-pay

## 2021-05-22 ENCOUNTER — Ambulatory Visit: Payer: Medicare PPO | Admitting: Podiatry

## 2021-05-22 DIAGNOSIS — B351 Tinea unguium: Secondary | ICD-10-CM

## 2021-05-22 DIAGNOSIS — L600 Ingrowing nail: Secondary | ICD-10-CM

## 2021-05-22 NOTE — Progress Notes (Signed)
This patient returns to the office for evaluation and treatment of long thick painful nails second right foot..Patient has ingrown nail first right  foot.   .  This patient is unable to trim her own nails since the patient cannot reach the feet.  Patient says the nails are   painful walking and wearing her shoes.  She returns for preventive foot care services.  General Appearance  Alert, conversant and in no acute stress.  Vascular  Dorsalis pedis and posterior tibial  pulses are palpable  bilaterally.  Capillary return is within normal limits  bilaterally. Temperature is within normal limits  bilaterally.  Neurologic  Senn-Weinstein monofilament wire test within normal limits  bilaterally. Muscle power within normal limits bilaterally.  Nails Thick disfigured discolored nails with subungual debris  second toenail right foot. Pincer nails second Right  Foot..   No evidence of bacterial infection or drainage bilaterally. Ontchomycosis with marked incurvation medial border right hallux.  No redness or swelling or pain.  Orthopedic  No limitations of motion  feet .  No crepitus or effusions noted.  No bony pathology or digital deformities noted.  Skin  normotropic skin with no porokeratosis noted bilaterally.  No signs of infections or ulcers noted.     Onychomycosis  Pain in toes right foot   Debridement  of nail second and fourth toenails right foot. with a nail nipper.  Nails were then filed using a dremel tool with no incidents.    RTC  10 weeks    Gardiner Barefoot DPM

## 2021-07-31 ENCOUNTER — Encounter: Payer: Self-pay | Admitting: Podiatry

## 2021-07-31 ENCOUNTER — Ambulatory Visit: Payer: Medicare PPO | Admitting: Podiatry

## 2021-07-31 ENCOUNTER — Other Ambulatory Visit: Payer: Self-pay

## 2021-07-31 DIAGNOSIS — B351 Tinea unguium: Secondary | ICD-10-CM | POA: Diagnosis not present

## 2021-07-31 DIAGNOSIS — L600 Ingrowing nail: Secondary | ICD-10-CM

## 2021-07-31 NOTE — Progress Notes (Signed)
This patient returns to the office for evaluation and treatment of long thick painful nails second right foot..Patient has ingrown nail first right  foot.   .  This patient is unable to trim her own nails since the patient cannot reach the feet.  Patient says the nails are   painful walking and wearing her shoes.  She returns for preventive foot care services.  General Appearance  Alert, conversant and in no acute stress.  Vascular  Dorsalis pedis and posterior tibial  pulses are palpable  bilaterally.  Capillary return is within normal limits  bilaterally. Temperature is within normal limits  bilaterally.  Neurologic  Senn-Weinstein monofilament wire test within normal limits  bilaterally. Muscle power within normal limits bilaterally.  Nails Thick disfigured discolored nails with subungual debris  second toenail right foot. Pincer nails second Right  Foot..   No evidence of bacterial infection or drainage bilaterally.  No redness or swelling or pain.  Orthopedic  No limitations of motion  feet .  No crepitus or effusions noted.  No bony pathology or digital deformities noted.  Skin  normotropic skin with no porokeratosis noted bilaterally.  No signs of infections or ulcers noted.     Onychomycosis  Pain in toes right foot   Debridement  of nail second and fourth toenails right foot. with a nail nipper.  Nails were then filed using a dremel tool with no incidents.  In future file first and then use nail nipper.  RTC  10 weeks    Shelby Peltz DPM  

## 2021-08-10 ENCOUNTER — Other Ambulatory Visit: Payer: Medicare PPO

## 2021-10-09 ENCOUNTER — Ambulatory Visit: Payer: Medicare PPO | Admitting: Podiatry

## 2021-10-09 ENCOUNTER — Encounter: Payer: Self-pay | Admitting: Podiatry

## 2021-10-09 DIAGNOSIS — L600 Ingrowing nail: Secondary | ICD-10-CM | POA: Diagnosis not present

## 2021-10-09 DIAGNOSIS — B351 Tinea unguium: Secondary | ICD-10-CM | POA: Diagnosis not present

## 2021-10-09 NOTE — Progress Notes (Signed)
This patient returns to the office for evaluation and treatment of long thick painful nails second right foot..Patient has ingrown nail first right  foot.   .  This patient is unable to trim her own nails since the patient cannot reach the feet.  Patient says the nails are   painful walking and wearing her shoes.  She returns for preventive foot care services.  General Appearance  Alert, conversant and in no acute stress.  Vascular  Dorsalis pedis and posterior tibial  pulses are palpable  bilaterally.  Capillary return is within normal limits  bilaterally. Temperature is within normal limits  bilaterally.  Neurologic  Senn-Weinstein monofilament wire test within normal limits  bilaterally. Muscle power within normal limits bilaterally.  Nails Thick disfigured discolored nails with subungual debris  second toenail right foot. Pincer nails second Right  Foot..   No evidence of bacterial infection or drainage bilaterally.  No redness or swelling or pain.  Orthopedic  No limitations of motion  feet .  No crepitus or effusions noted.  No bony pathology or digital deformities noted.  Skin  normotropic skin with no porokeratosis noted bilaterally.  No signs of infections or ulcers noted.     Onychomycosis  Pain in toes right foot   Debridement  of nail second and fourth toenails right foot. with a nail nipper.  Nails were then filed using a dremel tool with no incidents.  In future file first and then use nail nipper.  RTC  10 weeks    Caitlin Hillmer DPM  

## 2021-11-21 ENCOUNTER — Ambulatory Visit
Admission: RE | Admit: 2021-11-21 | Discharge: 2021-11-21 | Disposition: A | Discharge status: Home / Self Care | Payer: Medicare PPO | Source: Ambulatory Visit | Attending: Family Medicine | Admitting: Family Medicine

## 2021-11-21 DIAGNOSIS — E2839 Other primary ovarian failure: Secondary | ICD-10-CM

## 2021-11-27 ENCOUNTER — Other Ambulatory Visit: Payer: Self-pay | Admitting: Family Medicine

## 2021-11-27 DIAGNOSIS — Z1231 Encounter for screening mammogram for malignant neoplasm of breast: Secondary | ICD-10-CM

## 2021-12-19 ENCOUNTER — Encounter: Payer: Self-pay | Admitting: Podiatry

## 2021-12-19 ENCOUNTER — Ambulatory Visit: Payer: Medicare PPO | Admitting: Podiatry

## 2021-12-19 DIAGNOSIS — B351 Tinea unguium: Secondary | ICD-10-CM

## 2021-12-19 DIAGNOSIS — L600 Ingrowing nail: Secondary | ICD-10-CM | POA: Diagnosis not present

## 2021-12-19 NOTE — Progress Notes (Signed)
This patient returns to the office for evaluation and treatment of long thick painful nails second right foot..Patient has ingrown nail first right  foot.   .  This patient is unable to trim her own nails since the patient cannot reach the feet.  Patient says the nails are   painful walking and wearing her shoes.  She returns for preventive foot care services.  General Appearance  Alert, conversant and in no acute stress.  Vascular  Dorsalis pedis and posterior tibial  pulses are palpable  bilaterally.  Capillary return is within normal limits  bilaterally. Temperature is within normal limits  bilaterally.  Neurologic  Senn-Weinstein monofilament wire test within normal limits  bilaterally. Muscle power within normal limits bilaterally.  Nails Thick disfigured discolored nails with subungual debris  second toenail right foot. Pincer nails second Right  Foot..   No evidence of bacterial infection or drainage bilaterally.  No redness or swelling or pain.  Orthopedic  No limitations of motion  feet .  No crepitus or effusions noted.  No bony pathology or digital deformities noted.  Skin  normotropic skin with no porokeratosis noted bilaterally.  No signs of infections or ulcers noted.     Onychomycosis  Pain in toes right foot   Debridement  of nail second and fourth toenails right foot. with a nail nipper.  Nails were then filed using a dremel tool with no incidents.  In future file first and then use nail nipper.  RTC  10 weeks    Gardiner Barefoot DPM

## 2021-12-21 ENCOUNTER — Ambulatory Visit
Admission: RE | Admit: 2021-12-21 | Discharge: 2021-12-21 | Disposition: A | Discharge status: Home / Self Care | Payer: Medicare PPO | Source: Ambulatory Visit | Attending: Family Medicine | Admitting: Family Medicine

## 2021-12-21 DIAGNOSIS — Z1231 Encounter for screening mammogram for malignant neoplasm of breast: Secondary | ICD-10-CM

## 2022-03-19 ENCOUNTER — Encounter: Payer: Self-pay | Admitting: Podiatry

## 2022-03-19 ENCOUNTER — Ambulatory Visit: Payer: Medicare PPO | Admitting: Podiatry

## 2022-03-19 DIAGNOSIS — B351 Tinea unguium: Secondary | ICD-10-CM | POA: Diagnosis not present

## 2022-03-19 NOTE — Progress Notes (Signed)
This patient returns to the office for evaluation and treatment of long thick painful nails second right foot..Patient has ingrown nail first right  foot.   .  This patient is unable to trim her own nails since the patient cannot reach the feet.  Patient says the nails are   painful walking and wearing her shoes.  She returns for preventive foot care services.  General Appearance  Alert, conversant and in no acute stress.  Vascular  Dorsalis pedis and posterior tibial  pulses are palpable  bilaterally.  Capillary return is within normal limits  bilaterally. Temperature is within normal limits  bilaterally.  Neurologic  Senn-Weinstein monofilament wire test within normal limits  bilaterally. Muscle power within normal limits bilaterally.  Nails Thick disfigured discolored nails with subungual debris  second toenail right foot. Pincer nails second Right  Foot..   No evidence of bacterial infection or drainage bilaterally.  No redness or swelling or pain.  Orthopedic  No limitations of motion  feet .  No crepitus or effusions noted.  No bony pathology or digital deformities noted.  Skin  normotropic skin with no porokeratosis noted bilaterally.  No signs of infections or ulcers noted.     Onychomycosis  Pain in toes right foot   Debridement  of nail second and fifth  toenails right foot. with a nail nipper.  Nails were then filed using a dremel tool with no incidents.  In future file first and then use nail nipper.  RTC  10 weeks    Gardiner Barefoot DPM

## 2022-05-30 ENCOUNTER — Ambulatory Visit: Payer: Medicare PPO | Admitting: Podiatry

## 2022-05-30 ENCOUNTER — Encounter: Payer: Self-pay | Admitting: Podiatry

## 2022-05-30 DIAGNOSIS — B351 Tinea unguium: Secondary | ICD-10-CM | POA: Diagnosis not present

## 2022-05-30 NOTE — Progress Notes (Signed)
This patient returns to the office for evaluation and treatment of long thick painful nails second right foot..Patient has ingrown nail first right  foot.   .  This patient is unable to trim her own nails since the patient cannot reach the feet.  Patient says the nails are   painful walking and wearing her shoes.  She returns for preventive foot care services.  General Appearance  Alert, conversant and in no acute stress.  Vascular  Dorsalis pedis and posterior tibial  pulses are palpable  bilaterally.  Capillary return is within normal limits  bilaterally. Temperature is within normal limits  bilaterally.  Neurologic  Senn-Weinstein monofilament wire test within normal limits  bilaterally. Muscle power within normal limits bilaterally.  Nails Thick disfigured discolored nails with subungual debris  second toenail right foot. Pincer nails hallux Right  Foot..   No evidence of bacterial infection or drainage bilaterally.  No redness or swelling or pain.  Orthopedic  No limitations of motion  feet .  No crepitus or effusions noted.  No bony pathology or digital deformities noted.  Skin  normotropic skin with no porokeratosis noted bilaterally.  No signs of infections or ulcers noted.     Onychomycosis  Pain in toes right foot   Debridement  of nail second and fifth  toenails right foot. with a nail nipper.  Nails were then filed using a dremel tool with no incidents.  In future file first and then use nail nipper.  RTC  10 weeks    Gardiner Barefoot DPM

## 2022-08-14 ENCOUNTER — Ambulatory Visit: Payer: Medicare PPO | Admitting: Podiatry

## 2022-08-24 ENCOUNTER — Ambulatory Visit: Payer: Medicare PPO | Admitting: Podiatry

## 2022-08-24 ENCOUNTER — Encounter: Payer: Self-pay | Admitting: Podiatry

## 2022-08-24 DIAGNOSIS — B351 Tinea unguium: Secondary | ICD-10-CM | POA: Diagnosis not present

## 2022-08-24 NOTE — Progress Notes (Signed)
This patient returns to the office for evaluation and treatment of long thick painful nails second right foot..Patient has ingrown nail first right  foot.   .  This patient is unable to trim her own nails since the patient cannot reach the feet.  Patient says the nails are   painful walking and wearing her shoes.  She returns for preventive foot care services.  General Appearance  Alert, conversant and in no acute stress.  Vascular  Dorsalis pedis and posterior tibial  pulses are palpable  bilaterally.  Capillary return is within normal limits  bilaterally. Temperature is within normal limits  bilaterally.  Neurologic  Senn-Weinstein monofilament wire test within normal limits  bilaterally. Muscle power within normal limits bilaterally.  Nails Thick disfigured discolored nails with subungual debris  second toenail right foot. Pincer nails hallux Right  Foot..   No evidence of bacterial infection or drainage bilaterally.  No redness or swelling or pain.  Orthopedic  No limitations of motion  feet .  No crepitus or effusions noted.  No bony pathology or digital deformities noted.  Skin  normotropic skin with no porokeratosis noted bilaterally.  No signs of infections or ulcers noted.     Onychomycosis  Pain in toes right foot   Debridement  of nail second and fifth  toenails right foot. with a nail nipper.  Nails were then filed using a dremel tool with no incidents.  In future file first and then use nail nipper.  RTC  10 weeks    Brix Brearley DPM  

## 2022-11-02 ENCOUNTER — Encounter: Payer: Self-pay | Admitting: Podiatry

## 2022-11-02 ENCOUNTER — Ambulatory Visit: Payer: Medicare PPO | Admitting: Podiatry

## 2022-11-02 DIAGNOSIS — L600 Ingrowing nail: Secondary | ICD-10-CM | POA: Diagnosis not present

## 2022-11-02 DIAGNOSIS — B351 Tinea unguium: Secondary | ICD-10-CM

## 2022-11-02 NOTE — Progress Notes (Signed)
This patient returns to the office for evaluation and treatment of long thick painful nails second right foot..Patient has ingrown nail first right  foot.   .  This patient is unable to trim her own nails since the patient cannot reach the feet.  Patient says the nails are   painful walking and wearing her shoes.  She returns for preventive foot care services.  General Appearance  Alert, conversant and in no acute stress.  Vascular  Dorsalis pedis and posterior tibial  pulses are palpable  bilaterally.  Capillary return is within normal limits  bilaterally. Temperature is within normal limits  bilaterally.  Neurologic  Senn-Weinstein monofilament wire test within normal limits  bilaterally. Muscle power within normal limits bilaterally.  Nails Thick disfigured discolored nails with subungual debris  second toenail right foot. Pincer nails hallux Right  Foot..   No evidence of bacterial infection or drainage bilaterally.  No redness or swelling or pain.  Orthopedic  No limitations of motion  feet .  No crepitus or effusions noted.  No bony pathology or digital deformities noted.  Skin  normotropic skin with no porokeratosis noted bilaterally.  No signs of infections or ulcers noted.     Onychomycosis  Pain in toes right foot   Debridement  of nail second and fifth  toenails right foot. with a nail nipper.  Nails were then filed using a dremel tool with no incidents.  In future file first and then use nail nipper.  RTC  10 weeks    Berlyn Saylor DPM  

## 2022-11-29 ENCOUNTER — Other Ambulatory Visit: Payer: Self-pay | Admitting: Family Medicine

## 2022-11-29 DIAGNOSIS — Z1231 Encounter for screening mammogram for malignant neoplasm of breast: Secondary | ICD-10-CM

## 2022-12-24 ENCOUNTER — Ambulatory Visit
Admission: RE | Admit: 2022-12-24 | Discharge: 2022-12-24 | Disposition: A | Discharge status: Home / Self Care | Payer: Medicare PPO | Source: Ambulatory Visit | Attending: Family Medicine | Admitting: Family Medicine

## 2022-12-24 DIAGNOSIS — Z1231 Encounter for screening mammogram for malignant neoplasm of breast: Secondary | ICD-10-CM

## 2023-01-08 ENCOUNTER — Ambulatory Visit: Payer: Medicare PPO | Admitting: Podiatry

## 2023-01-08 DIAGNOSIS — B351 Tinea unguium: Secondary | ICD-10-CM

## 2023-01-08 DIAGNOSIS — L600 Ingrowing nail: Secondary | ICD-10-CM | POA: Diagnosis not present

## 2023-01-08 NOTE — Progress Notes (Signed)
This patient returns to the office for evaluation and treatment of long thick painful nails second right foot..Patient has ingrown nail first right  foot.   .  This patient is unable to trim her own nails since the patient cannot reach the feet.  Patient says the nails are   painful walking and wearing her shoes.  She returns for preventive foot care services.  General Appearance  Alert, conversant and in no acute stress.  Vascular  Dorsalis pedis and posterior tibial  pulses are palpable  bilaterally.  Capillary return is within normal limits  bilaterally. Temperature is within normal limits  bilaterally.  Neurologic  Senn-Weinstein monofilament wire test within normal limits  bilaterally. Muscle power within normal limits bilaterally.  Nails Thick disfigured discolored nails with subungual debris  second toenail right foot. Pincer nails hallux Right  Foot..   No evidence of bacterial infection or drainage bilaterally.  No redness or swelling or pain.  Orthopedic  No limitations of motion  feet .  No crepitus or effusions noted.  No bony pathology or digital deformities noted.  Skin  normotropic skin with no porokeratosis noted bilaterally.  No signs of infections or ulcers noted.     Onychomycosis  Pain in toes right foot   Debridement  of nail second and fifth  toenails right foot. with a nail nipper.  Nails were then filed using a dremel tool with no incidents.  In future file first and then use nail nipper.  RTC  10 weeks      DPM  

## 2023-03-19 ENCOUNTER — Ambulatory Visit: Payer: Medicare PPO | Admitting: Podiatry

## 2023-03-19 ENCOUNTER — Encounter: Payer: Self-pay | Admitting: Podiatry

## 2023-03-19 DIAGNOSIS — B351 Tinea unguium: Secondary | ICD-10-CM

## 2023-03-19 DIAGNOSIS — M79676 Pain in unspecified toe(s): Secondary | ICD-10-CM | POA: Diagnosis not present

## 2023-03-19 NOTE — Progress Notes (Signed)
This patient returns to the office for evaluation and treatment of long thick painful nails second right foot..   .  This patient is unable to trim her own nails since the patient cannot reach the feet.  Patient says the nails are   painful walking and wearing her shoes.  She returns for preventive foot care services.  General Appearance  Alert, conversant and in no acute stress.  Vascular  Dorsalis pedis and posterior tibial  pulses are  weakly palpable  bilaterally.  Capillary return is within normal limits  bilaterally. Temperature is within normal limits  bilaterally.  Neurologic  Senn-Weinstein monofilament wire test within normal limits  bilaterally. Muscle power within normal limits bilaterally.  Nails Thick disfigured discolored nails with subungual debris  second toenail right foot. Pincer nails hallux Right  Foot..   No evidence of bacterial infection or drainage bilaterally.  No redness or swelling or pain.  Orthopedic  No limitations of motion  feet .  No crepitus or effusions noted.  No bony pathology or digital deformities noted.   HAV  right foot.  Skin  normotropic skin with no porokeratosis noted bilaterally.  No signs of infections or ulcers noted.     Onychomycosis  Pain in toes right foot   HAV    Debridement  of nail second and fifth  toenails right foot. with a nail nipper.  Nails were then filed using a dremel tool with no incidents.  In future file first and then use nail nipper.  RTC  9   weeks   Prescribe voltaren.for bunion pain.   Helane Gunther DPM

## 2023-04-09 ENCOUNTER — Institutional Professional Consult (permissible substitution) (INDEPENDENT_AMBULATORY_CARE_PROVIDER_SITE_OTHER): Payer: Medicare PPO

## 2023-04-10 ENCOUNTER — Encounter (INDEPENDENT_AMBULATORY_CARE_PROVIDER_SITE_OTHER): Payer: Self-pay

## 2023-04-10 ENCOUNTER — Ambulatory Visit (INDEPENDENT_AMBULATORY_CARE_PROVIDER_SITE_OTHER): Payer: Medicare PPO | Admitting: Audiology

## 2023-04-10 ENCOUNTER — Ambulatory Visit (INDEPENDENT_AMBULATORY_CARE_PROVIDER_SITE_OTHER): Payer: Medicare PPO | Admitting: Otolaryngology

## 2023-04-10 VITALS — Ht 60.0 in | Wt 106.0 lb

## 2023-04-10 DIAGNOSIS — H903 Sensorineural hearing loss, bilateral: Secondary | ICD-10-CM

## 2023-04-10 DIAGNOSIS — R42 Dizziness and giddiness: Secondary | ICD-10-CM

## 2023-04-10 NOTE — Progress Notes (Signed)
  [address], [address] [place], [place] [address] [phone]  Audiological Evaluation    Name: Janet Cooper     DOB:   February 27, 1930      MRN:   [id number]                                                                                     Service Date: 04/10/2023        Patient was referred today for a hearing evaluation by Dr. Karle Barr.   Symptoms Yes Details  Tinnitus  []  Patient denied experiencing tinnitus.  Balance problems  [x]  Patient reported using a cane for support while walking.  Previous ear surgeries  []  Patient denied any previous ear surgeries.  Family history  []  Patient denied family history of hearing loss.  Amplification  []  Patient denied the use of hearing aids.    Tympanogram: Right ear: Normal external ear canal volume with normal middle ear pressure and tympanic membrane compliance (Type A). Left ear: Normal external ear canal volume with normal middle ear pressure and tympanic membrane compliance (Type A).    Hearing Evaluation: The audiogram was completed using conventional audiometric techniques under headphones with good reliability.   The hearing test results indicate: Right ear: Mild sensorineural hearing loss from [phone] Hz sloping to severe sensorineural hearing loss from 2000-8000 Hz. Left ear: Mild sensorineural hearing loss from [phone] Hz sloping to profound sensorineural hearing loss from 2000-8000 Hz.  Speech Audiometry: Right ear- Speech Reception Threshold (SRT) was obtained at 40 dBHL. Left ear- Speech Reception Threshold (SRT) was obtained at 40 dBHL.   Word Recognition Score Tested using NU-6 (MLV) Right ear: 84% was obtained at a presentation level of 65 dBHL which is deemed as good understanding. Left ear: 76% was obtained at a presentation level of 65 dBHL which is deemed as fair understanding.    Impression:  There is not a significant difference between puretone thresholds between  ears.   Recommendations: Repeat audiogram when changes are perceived or per MD. Patient is a candidate for hearing amplification, consider a hearing aid evaluation.   Conley Rolls [employer] Schlarb, AUD, CCC-A 04/10/23

## 2023-04-13 DIAGNOSIS — H903 Sensorineural hearing loss, bilateral: Secondary | ICD-10-CM | POA: Insufficient documentation

## 2023-04-13 DIAGNOSIS — R42 Dizziness and giddiness: Secondary | ICD-10-CM | POA: Insufficient documentation

## 2023-04-13 NOTE — Progress Notes (Signed)
Patient ID: Janet Cooper, female   DOB: 1929-09-04, 89 y.o.   MRN: [id number]  Cc: Chronic dizziness, "not feeling well"  HPI:  Janet Cooper is a 89 y.o. female who presents today complaining of chronic dizziness since January 2024.  According to the patient, her symptoms started after she took a medication to treat her overactive bladder.  Despite stopping the medication, she continues to be symptomatic.  She describes her dizziness as "not feeling well" and occasional nausea.  She denies any true spinning vertigo.  She was diagnosed with UTI, and was treated with oral antibiotic.  She has frequent motion sickness.  She was evaluated and treated at the [facility] physical therapy.  She had 9 visits over the past few months.  Her dizziness is often triggered with exertion, such as when she is walking or standing up.  She seldom has any dizziness when sitting still.  Currently she denies any otalgia or otorrhea.  Past Medical History:  Diagnosis Date   Arthritis    back   Chronic cystitis    Detrusor instability of bladder    Fecal incontinence    Glaucoma of both eyes    History of endometrial cancer    1990 dx  Stage IB, Grade 1 endometrial carcinoma  s/p TAH w/ BSO--  recurrence 1994 vaginal cuff  s/p radiation   History of radiation therapy    1994  lower pelvic area for recurrent endometrial cancer at vaginal cuff   Hyperlipidemia    Hypertension    OAB (overactive bladder)    Osteopenia 05/2013   T score -2.1 FRAX 8.9%/2.5%   Urge urinary incontinence    Wears glasses     Past Surgical History:  Procedure Laterality Date   ANAL RECTAL MANOMETRY N/A 03/29/2014   Procedure: ANO RECTAL MANOMETRY;  Surgeon: Romie Levee, MD;  Location: [facility];  Service: Endoscopy;  Laterality: N/A;   ANAL RECTAL MANOMETRY N/A 04/28/2014   Procedure: ANO RECTAL MANOMETRY;  Surgeon: Romie Levee, MD;  Location: [facility];  Service: Endoscopy;  Laterality: N/A;   CATARACT  EXTRACTION W/ INTRAOCULAR LENS  IMPLANT, BILATERAL  1990's   GLAUCOMA SURGERY Bilateral 2010   laser   PATELLA FRACTURE SURGERY Left 1995   SACRAL NERVE LEAD IMPLANTATION -- STAGE ONE INTERSTIM  09-01-2014   dr Romie Levee   fecal incontinence   STAGE TWO SACRAL NERVE STIMULATOR INTERSTIM  09-15-2014   TOTAL ABDOMINAL HYSTERECTOMY W/ BILATERAL SALPINGOOPHORECTOMY  1990    Family History  Problem Relation Age of Onset   Colon cancer Mother    Cancer Father        Liver cancer   Heart disease Maternal Grandmother    Heart disease Maternal Grandfather     Social History:  reports that she has never smoked. She has never used smokeless tobacco. She reports that she does not currently use alcohol after a past usage of about 1.0 standard drink of alcohol per week. She reports that she does not use drugs.  Allergies:  Allergies  Allergen Reactions   Homatropine Itching   Hydrocodone Itching   Sulfa Antibiotics Itching   Sulfamethoxazole Itching    Prior to Admission medications   Medication Sig Start Date End Date Taking? Authorizing Provider  amLODipine-olmesartan (AZOR) 5-40 MG tablet TAKE 1 TABLET BY MOUTH EVERY DAY 12/01/11  Yes [provider]  aspirin 81 MG EC tablet Take by mouth.   Yes [provider]  B Complex-C (B-COMPLEX WITH  VITAMIN C) tablet Take by mouth.   Yes [provider]  B Complex-C-Folic Acid (SM B-COMPLEX/VITAMIN C) TABS take 1 tablet by oral route every day   Yes [provider]  Calcium Citrate-Vitamin D 200-250 MG-UNIT TABS Take by mouth.   Yes [provider]  calcium-vitamin D (OSCAL WITH D) 500-200 MG-UNIT per tablet Take 1 tablet by mouth 2 (two) times daily.   Yes [provider]  cephALEXin (KEFLEX) 500 MG capsule Take 500 mg by mouth 3 (three) times daily. 10/18/22  Yes [provider]  Cholecalciferol 25 MCG (1000 UT) capsule    Yes [provider]  dorzolamide (TRUSOPT) 2 %  ophthalmic solution SMARTSIG:In Eye(s) 10/30/22  Yes [provider]  dorzolamide-timolol (COSOPT) 22.3-6.8 MG/ML ophthalmic solution Apply to eye.   Yes [provider]  estradiol (ESTRACE) 0.1 MG/GM vaginal cream insert (1G)  by vaginal route 3 times every week   Yes [provider]  GEMTESA 75 MG TABS Take by mouth. 07/12/21  Yes [provider]  glucosamine-chondroitin 500-400 MG tablet Take by mouth.   Yes [provider]  latanoprost (XALATAN) 0.005 % ophthalmic solution INSTILL 1 DROP INTO BOTH EYES AT BEDTIME 12/06/11  Yes [provider]  MAGNESIUM PO Take by mouth.   Yes [provider]  meclizine (ANTIVERT) 12.5 MG tablet TAKE ONE PILL UP TO THREE TIMES PER DAY FOR DIZZINESS 10/01/22  Yes [provider]  methocarbamol (ROBAXIN) 500 MG tablet Take 1 tablet (500 mg total) by mouth 2 (two) times daily as needed. 09/15/19  Yes Jari Sportsman L, PA-C  Omega-3 1000 MG CAPS Take by mouth.   Yes [provider]  omeprazole (PRILOSEC) 20 MG capsule Take 1 tablet by mouth daily. 02/19/22  Yes [provider]  predniSONE (STERAPRED UNI-PAK 21 TAB) 10 MG (21) TBPK tablet Take as directed 09/15/19  Yes Stanbery, Mary L, PA-C  PROLIA 60 MG/ML SOSY injection TO BE ADMINISTERED IN PHYSICIAN'S OFFICE. INJECT ONE SYRINGE SUBCUTANEOUSLY ONCE EVERY 6 MONTHS. REFRIGERATE. USE WITHIN 14 DAYS ONCE AT ROOM TEMPERATURE. 05/29/22  Yes [provider]  simvastatin (ZOCOR) 10 MG tablet TAKE 1 TABLET BY MOUTH EVERY DAY NIGHTLY 04/26/15  Yes [provider]  tamsulosin (FLOMAX) 0.4 MG CAPS capsule Take by mouth.   Yes [provider]  UNABLE TO FIND Take by mouth.   Yes [provider]   Height 5' (1.524 m), weight 106 lb (48.1 kg). Exam: General: Communicates without difficulty, well nourished, no acute distress. Head: Normocephalic, no evidence injury, no tenderness, facial buttresses intact  without stepoff. Face/sinus: No tenderness to palpation and percussion. Facial movement is normal and symmetric. Eyes: PERRL, EOMI. No scleral icterus, conjunctivae clear. Neuro: CN II exam reveals vision grossly intact.  No nystagmus at any point of gaze. Ears: Auricles well formed without lesions.  Ear canals are intact without mass or lesion.  No erythema or edema is appreciated.  The TMs are intact without fluid. Nose: External evaluation reveals normal support and skin without lesions.  Dorsum is intact.  Anterior rhinoscopy reveals congested mucosa over anterior aspect of inferior turbinates and intact septum.  No purulence noted. Oral:  Oral cavity and oropharynx are intact, symmetric, without erythema or edema.  Mucosa is moist without lesions. Neck: Full range of motion without pain.  There is no significant lymphadenopathy.  No masses palpable.  Thyroid bed within normal limits to palpation.  Parotid glands and submandibular glands equal bilaterally without mass.  Trachea is midline. Neuro:  CN 2-12 grossly intact.  Vestibular: No nystagmus at any point of gaze. Dix Hallpike negative.  She reports dizziness and not feeling well when sitting up from a supine position.  Vestibular: There is no nystagmus with pneumatic pressure on either tympanic membrane or Valsalva. The cerebellar examination is unremarkable.    The patient has bilateral symmetric high-frequency sensorineural hearing loss, likely secondary to presbycusis.  Assessment: 1.  Chronic dizziness, likely secondary to multifactorial causes.  Based on her history and physical exam findings, the dizziness is likely secondary to general deconditioning and orthostatic causes.  She is able to recreate the dizziness when sitting up quickly from a supine position. 2.  Bilateral symmetric high-frequency sensorineural hearing loss, likely secondary to presbycusis. 3.  Her ear canals, tympanic membranes, and middle ear spaces are all normal.  Her  Dix-Hallpike maneuver is negative.  Plan: 1.  The physical exam findings and the hearing test results are reviewed with the patient. 2.  The pathophysiology of dizziness and vestibular dysfunction are discussed extensively with the patient. The possible differential diagnoses are reviewed. Questions are invited and answered.   3.  The patient is encouraged to perform balance exercises at home.  She is advised to slowly increase her activity level to improve her general physical conditioning. 4.  If she continues to be symptomatic, we may proceed with vestibular neurodiagnostic testing to rule out any vestibular dysfunction. 5.  The patient is encouraged to call with any questions or concerns.  Ajmal Kathan W Jakeya Gherardi 04/13/2023, 1:00 PM

## 2023-05-21 ENCOUNTER — Ambulatory Visit: Payer: Medicare PPO | Admitting: Podiatry

## 2023-05-21 ENCOUNTER — Encounter: Payer: Self-pay | Admitting: Podiatry

## 2023-05-21 DIAGNOSIS — M79675 Pain in left toe(s): Secondary | ICD-10-CM | POA: Diagnosis not present

## 2023-05-21 DIAGNOSIS — B351 Tinea unguium: Secondary | ICD-10-CM | POA: Diagnosis not present

## 2023-05-21 DIAGNOSIS — L600 Ingrowing nail: Secondary | ICD-10-CM

## 2023-05-21 DIAGNOSIS — M79674 Pain in right toe(s): Secondary | ICD-10-CM

## 2023-05-21 NOTE — Progress Notes (Signed)
This patient returns to the office for evaluation and treatment of long thick painful nails second right foot..   .  This patient is unable to trim her own nails since the patient cannot reach the feet.  Patient says the nails are   painful walking and wearing her shoes.  She returns for preventive foot care services.  General Appearance  Alert, conversant and in no acute stress.  Vascular  Dorsalis pedis and posterior tibial  pulses are  weakly palpable  bilaterally.  Capillary return is within normal limits  bilaterally. Temperature is within normal limits  bilaterally.  Neurologic  Senn-Weinstein monofilament wire test within normal limits  bilaterally. Muscle power within normal limits bilaterally.  Nails Thick disfigured discolored nails with subungual debris  second toenail right foot. Pincer nails hallux Right  Foot..   No evidence of bacterial infection or drainage bilaterally.  No redness or swelling or pain.  Orthopedic  No limitations of motion  feet .  No crepitus or effusions noted.  No bony pathology or digital deformities noted.   HAV  right foot.  Skin  normotropic skin with no porokeratosis noted bilaterally.  No signs of infections or ulcers noted.     Onychomycosis  Pain in toes right foot   HAV    Debridement  of nail second and fifth  toenails right foot. with a nail nipper.  Nails were then filed using a dremel tool with no incidents.  In future file first and then use nail nipper.  RTC  9   weeks   Prescribe voltaren.for bunion pain.   Helane Gunther DPM

## 2023-07-23 ENCOUNTER — Ambulatory Visit: Payer: Medicare PPO | Admitting: Podiatry

## 2023-07-23 ENCOUNTER — Encounter: Payer: Self-pay | Admitting: Podiatry

## 2023-07-23 DIAGNOSIS — B351 Tinea unguium: Secondary | ICD-10-CM

## 2023-07-23 DIAGNOSIS — L600 Ingrowing nail: Secondary | ICD-10-CM | POA: Diagnosis not present

## 2023-07-23 NOTE — Progress Notes (Signed)
This patient returns to the office for evaluation and treatment of long thick painful nails second right foot..   .  This patient is unable to trim her own nails since the patient cannot reach the feet.  Patient says the nails are   painful walking and wearing her shoes.  She returns for preventive foot care services.  General Appearance  Alert, conversant and in no acute stress.  Vascular  Dorsalis pedis and posterior tibial  pulses are  weakly palpable  bilaterally.  Capillary return is within normal limits  bilaterally. Temperature is within normal limits  bilaterally.  Neurologic  Senn-Weinstein monofilament wire test within normal limits  bilaterally. Muscle power within normal limits bilaterally.  Nails Thick disfigured discolored nails with subungual debris  second toenail right foot. Pincer nails hallux Right  Foot..   No evidence of bacterial infection or drainage bilaterally.  No redness or swelling or pain.  Orthopedic  No limitations of motion  feet .  No crepitus or effusions noted.  No bony pathology or digital deformities noted.   HAV  right foot.  Skin  normotropic skin with no porokeratosis noted bilaterally.  No signs of infections or ulcers noted.     Onychomycosis  Pain in toes right foot      Debridement  of nail second and fifth  toenails right foot. with a nail nipper.  Nails were then filed using a dremel tool with no incidents.  In future file first and then use nail nipper.  RTC  9   weeks     Helane Gunther DPM

## 2023-09-13 ENCOUNTER — Other Ambulatory Visit: Payer: Self-pay | Admitting: Family Medicine

## 2023-09-13 DIAGNOSIS — Z1231 Encounter for screening mammogram for malignant neoplasm of breast: Secondary | ICD-10-CM

## 2023-09-27 ENCOUNTER — Ambulatory Visit: Payer: Medicare PPO | Admitting: Podiatry

## 2023-09-30 ENCOUNTER — Ambulatory Visit: Admitting: Podiatry

## 2023-09-30 ENCOUNTER — Encounter: Payer: Self-pay | Admitting: Podiatry

## 2023-09-30 DIAGNOSIS — M79674 Pain in right toe(s): Secondary | ICD-10-CM | POA: Diagnosis not present

## 2023-09-30 DIAGNOSIS — M79675 Pain in left toe(s): Secondary | ICD-10-CM

## 2023-09-30 DIAGNOSIS — L600 Ingrowing nail: Secondary | ICD-10-CM

## 2023-09-30 DIAGNOSIS — B351 Tinea unguium: Secondary | ICD-10-CM

## 2023-09-30 NOTE — Progress Notes (Signed)
 This patient returns to the office for evaluation and treatment of long thick painful nails second right foot..   .  This patient is unable to trim her own nails since the patient cannot reach the feet.  Patient says the nails are   painful walking and wearing her shoes.  She returns for preventive foot care services.  General Appearance  Alert, conversant and in no acute stress.  Vascular  Dorsalis pedis and posterior tibial  pulses are  weakly palpable  bilaterally.  Capillary return is within normal limits  bilaterally. Temperature is within normal limits  bilaterally.  Neurologic  Senn-Weinstein monofilament wire test within normal limits  bilaterally. Muscle power within normal limits bilaterally.  Nails Thick disfigured discolored nails with subungual debris  second toenail right foot. Pincer nails hallux Right  Foot..   No evidence of bacterial infection or drainage bilaterally.  No redness or swelling or pain.  Orthopedic  No limitations of motion  feet .  No crepitus or effusions noted.  No bony pathology or digital deformities noted.   HAV  right foot.  Skin  normotropic skin with no porokeratosis noted bilaterally.  No signs of infections or ulcers noted.     Onychomycosis  Pain in toes right foot      Debridement  of nail second and fifth  toenails right foot. with a nail nipper.  Nails were then filed using a dremel tool with no incidents.  In future file first and then use nail nipper.  RTC  10   weeks     Ruffin Cotton DPM

## 2023-11-11 ENCOUNTER — Encounter (HOSPITAL_COMMUNITY): Payer: Self-pay | Admitting: Emergency Medicine

## 2023-11-11 ENCOUNTER — Other Ambulatory Visit: Payer: Self-pay

## 2023-11-11 ENCOUNTER — Emergency Department (HOSPITAL_COMMUNITY)

## 2023-11-11 ENCOUNTER — Inpatient Hospital Stay (HOSPITAL_COMMUNITY)
Admission: EM | Admit: 2023-11-11 | Discharge: 2023-11-20 | DRG: 521 | Disposition: A | Discharge status: Skilled Nursing Facility | Attending: Internal Medicine | Admitting: Internal Medicine

## 2023-11-11 DIAGNOSIS — Z90722 Acquired absence of ovaries, bilateral: Secondary | ICD-10-CM

## 2023-11-11 DIAGNOSIS — Z8542 Personal history of malignant neoplasm of other parts of uterus: Secondary | ICD-10-CM

## 2023-11-11 DIAGNOSIS — B9620 Unspecified Escherichia coli [E. coli] as the cause of diseases classified elsewhere: Secondary | ICD-10-CM | POA: Diagnosis present

## 2023-11-11 DIAGNOSIS — Z638 Other specified problems related to primary support group: Secondary | ICD-10-CM

## 2023-11-11 DIAGNOSIS — I10 Essential (primary) hypertension: Secondary | ICD-10-CM | POA: Diagnosis present

## 2023-11-11 DIAGNOSIS — Z8543 Personal history of malignant neoplasm of ovary: Secondary | ICD-10-CM

## 2023-11-11 DIAGNOSIS — Z885 Allergy status to narcotic agent status: Secondary | ICD-10-CM

## 2023-11-11 DIAGNOSIS — K219 Gastro-esophageal reflux disease without esophagitis: Secondary | ICD-10-CM | POA: Diagnosis present

## 2023-11-11 DIAGNOSIS — Y92009 Unspecified place in unspecified non-institutional (private) residence as the place of occurrence of the external cause: Secondary | ICD-10-CM | POA: Diagnosis not present

## 2023-11-11 DIAGNOSIS — E8809 Other disorders of plasma-protein metabolism, not elsewhere classified: Secondary | ICD-10-CM | POA: Diagnosis present

## 2023-11-11 DIAGNOSIS — Z515 Encounter for palliative care: Secondary | ICD-10-CM | POA: Diagnosis not present

## 2023-11-11 DIAGNOSIS — D696 Thrombocytopenia, unspecified: Secondary | ICD-10-CM | POA: Diagnosis present

## 2023-11-11 DIAGNOSIS — R197 Diarrhea, unspecified: Secondary | ICD-10-CM | POA: Diagnosis present

## 2023-11-11 DIAGNOSIS — E872 Acidosis, unspecified: Secondary | ICD-10-CM | POA: Diagnosis present

## 2023-11-11 DIAGNOSIS — F419 Anxiety disorder, unspecified: Secondary | ICD-10-CM | POA: Diagnosis present

## 2023-11-11 DIAGNOSIS — Z79899 Other long term (current) drug therapy: Secondary | ICD-10-CM | POA: Diagnosis not present

## 2023-11-11 DIAGNOSIS — Z882 Allergy status to sulfonamides status: Secondary | ICD-10-CM

## 2023-11-11 DIAGNOSIS — Z7982 Long term (current) use of aspirin: Secondary | ICD-10-CM | POA: Diagnosis not present

## 2023-11-11 DIAGNOSIS — E440 Moderate protein-calorie malnutrition: Secondary | ICD-10-CM | POA: Diagnosis present

## 2023-11-11 DIAGNOSIS — Z66 Do not resuscitate: Secondary | ICD-10-CM | POA: Diagnosis present

## 2023-11-11 DIAGNOSIS — S72001A Fracture of unspecified part of neck of right femur, initial encounter for closed fracture: Secondary | ICD-10-CM | POA: Diagnosis present

## 2023-11-11 DIAGNOSIS — R45851 Suicidal ideations: Secondary | ICD-10-CM | POA: Diagnosis present

## 2023-11-11 DIAGNOSIS — D649 Anemia, unspecified: Secondary | ICD-10-CM | POA: Diagnosis not present

## 2023-11-11 DIAGNOSIS — R579 Shock, unspecified: Secondary | ICD-10-CM | POA: Diagnosis not present

## 2023-11-11 DIAGNOSIS — R571 Hypovolemic shock: Secondary | ICD-10-CM | POA: Diagnosis not present

## 2023-11-11 DIAGNOSIS — I9581 Postprocedural hypotension: Secondary | ICD-10-CM | POA: Diagnosis not present

## 2023-11-11 DIAGNOSIS — Z1611 Resistance to penicillins: Secondary | ICD-10-CM | POA: Diagnosis present

## 2023-11-11 DIAGNOSIS — R159 Full incontinence of feces: Secondary | ICD-10-CM | POA: Diagnosis present

## 2023-11-11 DIAGNOSIS — E876 Hypokalemia: Secondary | ICD-10-CM | POA: Diagnosis not present

## 2023-11-11 DIAGNOSIS — S72141A Displaced intertrochanteric fracture of right femur, initial encounter for closed fracture: Principal | ICD-10-CM | POA: Diagnosis present

## 2023-11-11 DIAGNOSIS — R7881 Bacteremia: Secondary | ICD-10-CM | POA: Diagnosis not present

## 2023-11-11 DIAGNOSIS — W109XXA Fall (on) (from) unspecified stairs and steps, initial encounter: Secondary | ICD-10-CM | POA: Diagnosis present

## 2023-11-11 DIAGNOSIS — N3281 Overactive bladder: Secondary | ICD-10-CM | POA: Diagnosis present

## 2023-11-11 DIAGNOSIS — Z6824 Body mass index (BMI) 24.0-24.9, adult: Secondary | ICD-10-CM

## 2023-11-11 DIAGNOSIS — Z8249 Family history of ischemic heart disease and other diseases of the circulatory system: Secondary | ICD-10-CM | POA: Diagnosis not present

## 2023-11-11 DIAGNOSIS — I959 Hypotension, unspecified: Secondary | ICD-10-CM | POA: Diagnosis not present

## 2023-11-11 DIAGNOSIS — Z634 Disappearance and death of family member: Secondary | ICD-10-CM

## 2023-11-11 DIAGNOSIS — R578 Other shock: Secondary | ICD-10-CM | POA: Diagnosis not present

## 2023-11-11 DIAGNOSIS — R0600 Dyspnea, unspecified: Secondary | ICD-10-CM | POA: Diagnosis not present

## 2023-11-11 DIAGNOSIS — S72001P Fracture of unspecified part of neck of right femur, subsequent encounter for closed fracture with malunion: Secondary | ICD-10-CM | POA: Diagnosis not present

## 2023-11-11 DIAGNOSIS — R0902 Hypoxemia: Secondary | ICD-10-CM | POA: Diagnosis present

## 2023-11-11 DIAGNOSIS — Z96641 Presence of right artificial hip joint: Secondary | ICD-10-CM

## 2023-11-11 DIAGNOSIS — Z923 Personal history of irradiation: Secondary | ICD-10-CM

## 2023-11-11 DIAGNOSIS — R339 Retention of urine, unspecified: Secondary | ICD-10-CM | POA: Diagnosis present

## 2023-11-11 DIAGNOSIS — E785 Hyperlipidemia, unspecified: Secondary | ICD-10-CM | POA: Diagnosis present

## 2023-11-11 DIAGNOSIS — E8770 Fluid overload, unspecified: Secondary | ICD-10-CM | POA: Diagnosis not present

## 2023-11-11 DIAGNOSIS — N3020 Other chronic cystitis without hematuria: Secondary | ICD-10-CM | POA: Diagnosis present

## 2023-11-11 DIAGNOSIS — H409 Unspecified glaucoma: Secondary | ICD-10-CM | POA: Diagnosis present

## 2023-11-11 DIAGNOSIS — D62 Acute posthemorrhagic anemia: Secondary | ICD-10-CM | POA: Diagnosis not present

## 2023-11-11 DIAGNOSIS — Z608 Other problems related to social environment: Secondary | ICD-10-CM | POA: Diagnosis present

## 2023-11-11 DIAGNOSIS — E861 Hypovolemia: Secondary | ICD-10-CM | POA: Diagnosis not present

## 2023-11-11 DIAGNOSIS — W19XXXA Unspecified fall, initial encounter: Principal | ICD-10-CM

## 2023-11-11 DIAGNOSIS — M79601 Pain in right arm: Secondary | ICD-10-CM | POA: Diagnosis present

## 2023-11-11 DIAGNOSIS — Z90710 Acquired absence of both cervix and uterus: Secondary | ICD-10-CM

## 2023-11-11 DIAGNOSIS — Z800 Family history of malignant neoplasm of digestive organs: Secondary | ICD-10-CM

## 2023-11-11 DIAGNOSIS — B9689 Other specified bacterial agents as the cause of diseases classified elsewhere: Secondary | ICD-10-CM | POA: Diagnosis present

## 2023-11-11 LAB — BASIC METABOLIC PANEL WITH GFR
Anion gap: 6 (ref 5–15)
BUN: 18 mg/dL (ref 8–23)
CO2: 20 mmol/L — ABNORMAL LOW (ref 22–32)
Calcium: 7.6 mg/dL — ABNORMAL LOW (ref 8.9–10.3)
Chloride: 112 mmol/L — ABNORMAL HIGH (ref 98–111)
Creatinine, Ser: 0.75 mg/dL (ref 0.44–1.00)
GFR, Estimated: >60 mL/min (ref >60)
Glucose, Bld: 141 mg/dL — ABNORMAL HIGH (ref 70–99)
Potassium: 3.6 mmol/L (ref 3.5–5.1)
Sodium: 138 mmol/L (ref 135–145)

## 2023-11-11 LAB — CBC WITH DIFFERENTIAL/PLATELET
Abs Immature Granulocytes: 0.1 10*3/uL — ABNORMAL HIGH (ref 0.00–0.07)
Basophils Absolute: 0.1 10*3/uL (ref 0.0–0.1)
Basophils Relative: 0 %
Eosinophils Absolute: 0.1 10*3/uL (ref 0.0–0.5)
Eosinophils Relative: 1 %
HCT: 36.9 % (ref 36.0–46.0)
Hemoglobin: 12 g/dL (ref 12.0–15.0)
Immature Granulocytes: 1 %
Lymphocytes Relative: 6 %
Lymphs Abs: 0.9 10*3/uL (ref 0.7–4.0)
MCH: 31.7 pg (ref 26.0–34.0)
MCHC: 32.5 g/dL (ref 30.0–36.0)
MCV: 97.6 fL (ref 80.0–100.0)
Monocytes Absolute: 0.8 10*3/uL (ref 0.1–1.0)
Monocytes Relative: 6 %
Neutro Abs: 12.4 10*3/uL — ABNORMAL HIGH (ref 1.7–7.7)
Neutrophils Relative %: 86 %
Platelets: 179 10*3/uL (ref 150–400)
RBC: 3.78 MIL/uL — ABNORMAL LOW (ref 3.87–5.11)
RDW: 13.2 % (ref 11.5–15.5)
WBC: 14.4 10*3/uL — ABNORMAL HIGH (ref 4.0–10.5)
nRBC: 0 % (ref 0.0–0.2)

## 2023-11-11 LAB — PROTIME-INR
INR: 1 (ref 0.8–1.2)
Prothrombin Time: 13.6 s (ref 11.4–15.2)

## 2023-11-11 LAB — HEPATIC FUNCTION PANEL
ALT: 15 U/L (ref 0–44)
AST: 18 U/L (ref 15–41)
Albumin: 2.8 g/dL — ABNORMAL LOW (ref 3.5–5.0)
Alkaline Phosphatase: 25 U/L — ABNORMAL LOW (ref 38–126)
Bilirubin, Direct: <0.1 mg/dL (ref 0.0–0.2)
Total Bilirubin: 0.5 mg/dL (ref 0.0–1.2)
Total Protein: 5.2 g/dL — ABNORMAL LOW (ref 6.5–8.1)

## 2023-11-11 LAB — CK: Total CK: 111 U/L (ref 38–234)

## 2023-11-11 LAB — PHOSPHORUS: Phosphorus: 3.1 mg/dL (ref 2.5–4.6)

## 2023-11-11 LAB — MAGNESIUM: Magnesium: 2.1 mg/dL (ref 1.7–2.4)

## 2023-11-11 MED ORDER — METHOCARBAMOL 500 MG PO TABS: 500.0000 mg | ORAL_TABLET | Freq: Four times a day (QID) | ORAL | Status: DC | PRN

## 2023-11-11 MED ORDER — DORZOLAMIDE HCL-TIMOLOL MAL 2-0.5 % OP SOLN
1.0000 [drp] | Freq: Two times a day (BID) | OPHTHALMIC | Status: DC
  Administered 2023-11-13 – 2023-11-20 (×15): 1 [drp] via OPHTHALMIC
  Filled 2023-11-11 (×2): qty 10

## 2023-11-11 MED ORDER — LACTATED RINGERS IV BOLUS
500.0000 mL | Freq: Once | INTRAVENOUS | Status: AC
  Administered 2023-11-11: 500 mL via INTRAVENOUS

## 2023-11-11 MED ORDER — METOCLOPRAMIDE HCL 5 MG/ML IJ SOLN
10.0000 mg | Freq: Four times a day (QID) | INTRAMUSCULAR | Status: DC | PRN
  Administered 2023-11-12 (×2): 10 mg via INTRAVENOUS
  Filled 2023-11-11 (×2): qty 2

## 2023-11-11 MED ORDER — ONDANSETRON HCL 4 MG/2ML IJ SOLN
4.0000 mg | Freq: Once | INTRAMUSCULAR | Status: AC
  Administered 2023-11-11: 4 mg via INTRAVENOUS
  Filled 2023-11-11: qty 2

## 2023-11-11 MED ORDER — SODIUM CHLORIDE 0.9 % IV BOLUS
500.0000 mL | Freq: Once | INTRAVENOUS | Status: AC
  Administered 2023-11-12: 500 mL via INTRAVENOUS

## 2023-11-11 MED ORDER — FENTANYL CITRATE PF 50 MCG/ML IJ SOSY
50.0000 ug | PREFILLED_SYRINGE | INTRAMUSCULAR | Status: AC | PRN
  Administered 2023-11-12: 50 ug via INTRAVENOUS
  Filled 2023-11-11: qty 1

## 2023-11-11 MED ORDER — METHOCARBAMOL 1000 MG/10ML IJ SOLN: 500.0000 mg | Freq: Four times a day (QID) | INTRAMUSCULAR | Status: DC | PRN

## 2023-11-11 MED ORDER — SODIUM CHLORIDE 0.9 % IV SOLN: INTRAVENOUS | Status: AC

## 2023-11-11 MED ORDER — SIMVASTATIN 20 MG PO TABS
10.0000 mg | ORAL_TABLET | Freq: Every day | ORAL | Status: DC
  Administered 2023-11-13 – 2023-11-19 (×7): 10 mg via ORAL
  Filled 2023-11-11 (×7): qty 1

## 2023-11-11 MED ORDER — FENTANYL CITRATE PF 50 MCG/ML IJ SOSY
50.0000 ug | PREFILLED_SYRINGE | INTRAMUSCULAR | Status: DC | PRN
  Administered 2023-11-11: 50 ug via INTRAVENOUS
  Filled 2023-11-11: qty 1

## 2023-11-11 MED ORDER — LATANOPROST 0.005 % OP SOLN
1.0000 [drp] | Freq: Every day | OPHTHALMIC | Status: DC
  Administered 2023-11-14 – 2023-11-19 (×7): 1 [drp] via OPHTHALMIC
  Filled 2023-11-11 (×2): qty 2.5

## 2023-11-11 MED ORDER — METOCLOPRAMIDE HCL 5 MG/ML IJ SOLN
10.0000 mg | Freq: Once | INTRAMUSCULAR | Status: AC
  Administered 2023-11-11: 10 mg via INTRAVENOUS
  Filled 2023-11-11: qty 2

## 2023-11-11 NOTE — ED Provider Notes (Signed)
 [place] EMERGENCY DEPARTMENT AT [facility] Provider Note   CSN: [id number] Arrival date & time: 11/11/23  1957     History  Chief Complaint  Patient presents with   Fall   Hip Injury    Right    Janet Cooper is a 89 y.o. female.  Patient is a 89 year old female with a history of hypertension, hyperlipidemia, fecal and urinary incontinence who is presenting today after a fall.  She reports she was coming down 2 steps and she fell landing on her right side.  She was having severe pain in her right leg and was unable to get up.  She denies hitting her head or loss of consciousness.  She reports she always leaks a lot of stool but today has been a little bit more.  She denies any abdominal pain, shortness of breath, chest pain.  No headaches.  She is feeling some nausea after getting the pain medication.  She denies any numbness or tingling in her foot.  She reports as long as she does not move her right leg it does not hurt too bad.  She has never had surgery on this leg in the past.  The history is provided by the patient and the EMS personnel.  Fall       Home Medications Prior to Admission medications   Medication Sig Start Date End Date Taking? Authorizing Provider  amLODipine-olmesartan (AZOR) 5-40 MG tablet TAKE 1 TABLET BY MOUTH EVERY DAY 12/01/11   [provider]  aspirin 81 MG EC tablet Take by mouth.    [provider]  B Complex-C (B-COMPLEX WITH VITAMIN C) tablet Take by mouth.    [provider]  B Complex-C-Folic Acid (SM B-COMPLEX/VITAMIN C) TABS take 1 tablet by oral route every day    [provider]  Calcium Citrate-Vitamin D 200-250 MG-UNIT TABS Take by mouth.    [provider]  calcium-vitamin D (OSCAL WITH D) 500-200 MG-UNIT per tablet Take 1 tablet by mouth 2 (two) times daily.    [provider]  cephALEXin (KEFLEX) 500 MG capsule Take 500 mg by mouth 3 (three) times daily. 10/18/22    [provider]  Cholecalciferol 25 MCG (1000 UT) capsule     [provider]  dorzolamide (TRUSOPT) 2 % ophthalmic solution SMARTSIG:In Eye(s) 10/30/22   [provider]  dorzolamide-timolol (COSOPT) 22.3-6.8 MG/ML ophthalmic solution Apply to eye.    [provider]  estradiol (ESTRACE) 0.1 MG/GM vaginal cream insert (1G)  by vaginal route 3 times every week    [provider]  GEMTESA 75 MG TABS Take by mouth. 07/12/21   [provider]  glucosamine-chondroitin 500-400 MG tablet Take by mouth.    [provider]  latanoprost (XALATAN) 0.005 % ophthalmic solution INSTILL 1 DROP INTO BOTH EYES AT BEDTIME 12/06/11   [provider]  MAGNESIUM PO Take by mouth.    [provider]  meclizine (ANTIVERT) 12.5 MG tablet TAKE ONE PILL UP TO THREE TIMES PER DAY FOR DIZZINESS 10/01/22   [provider]  methocarbamol  (ROBAXIN ) 500 MG tablet Take 1 tablet (500 mg total) by mouth 2 (two) times daily as needed. 09/15/19   Sandie Cross, PA-C  Omega-3 1000 MG CAPS Take by mouth.    [provider]  omeprazole (PRILOSEC) 20 MG capsule Take 1 tablet by mouth daily. 02/19/22   [provider]  predniSONE  (STERAPRED UNI-PAK 21 TAB) 10 MG (21) TBPK tablet  Take as directed 09/15/19   Sandie Cross, PA-C  PROLIA 60 MG/ML SOSY injection TO BE ADMINISTERED IN PHYSICIAN'S OFFICE. INJECT ONE SYRINGE SUBCUTANEOUSLY ONCE EVERY 6 MONTHS. REFRIGERATE. USE WITHIN 14 DAYS ONCE AT ROOM TEMPERATURE. 05/29/22   [provider]  simvastatin (ZOCOR) 10 MG tablet TAKE 1 TABLET BY MOUTH EVERY DAY NIGHTLY 04/26/15   [provider]  tamsulosin (FLOMAX) 0.4 MG CAPS capsule Take by mouth.    [provider]  UNABLE TO FIND Take by mouth.    [provider]      Allergies    Homatropine, Hydrocodone, Sulfa antibiotics, and Sulfamethoxazole    Review of Systems   Review of Systems  Physical  Exam Updated Vital Signs BP 94/74   Pulse 77   Temp (!) 97.5 F (36.4 C) (Oral)   Resp 12   SpO2 97%  Physical Exam Vitals and nursing note reviewed.  Constitutional:      General: She is not in acute distress.    Appearance: She is well-developed.  HENT:     Head: Normocephalic and atraumatic.  Eyes:     Pupils: Pupils are equal, round, and reactive to light.  Cardiovascular:     Rate and Rhythm: Regular rhythm. Tachycardia present.     Pulses: Normal pulses.     Heart sounds: Normal heart sounds. No murmur heard.    No friction rub.  Pulmonary:     Effort: Pulmonary effort is normal.     Breath sounds: Normal breath sounds. No wheezing or rales.  Abdominal:     General: Bowel sounds are normal. There is no distension.     Palpations: Abdomen is soft.     Tenderness: There is no abdominal tenderness. There is no guarding or rebound.  Musculoskeletal:        General: Tenderness and deformity present.     Right hip: Deformity and tenderness present. Decreased range of motion.     Comments: No edema  Skin:    General: Skin is warm and dry.     Findings: No rash.  Neurological:     Mental Status: She is alert and oriented to person, place, and time.     Cranial Nerves: No cranial nerve deficit.     Comments: Normal movement and sensation in the right foot  Psychiatric:        Behavior: Behavior normal.     ED Results / Procedures / Treatments   Labs (all labs ordered are listed, but only abnormal results are displayed) Labs Reviewed  BASIC METABOLIC PANEL WITH GFR - Abnormal; Notable for the following components:      Result Value   Chloride 112 (*)    CO2 20 (*)    Glucose, Bld 141 (*)    Calcium 7.6 (*)    All other components within normal limits  CBC WITH DIFFERENTIAL/PLATELET - Abnormal; Notable for the following components:   WBC 14.4 (*)    RBC 3.78 (*)    Neutro Abs 12.4 (*)    Abs Immature Granulocytes 0.10 (*)    All other components within normal  limits  PROTIME-INR  TYPE AND SCREEN    EKG EKG Interpretation Date/Time:  Monday November 11 2023 20:34:20 EDT Ventricular Rate:  75 PR Interval:  175 QRS Duration:  137 QT Interval:  406 QTC Calculation: 454 R Axis:   -1  Text Interpretation: Sinus rhythm Right bundle branch block No significant change since last tracing Confirmed by Almond Army (  [id number]) on 11/11/2023 9:01:53 PM  Radiology DG Chest Port 1 View Result Date: 11/11/2023 CLINICAL DATA:  med clearance.  Right hip deformity/fracture EXAM: PORTABLE CHEST 1 VIEW COMPARISON:  None Available. FINDINGS: The heart and mediastinal contours are within normal limits. Atherosclerotic plaque. Left base atelectasis. No focal consolidation. No pulmonary edema. No pleural effusion. No pneumothorax. No acute osseous abnormality. IMPRESSION: 1. No active disease. 2.  Aortic Atherosclerosis (ICD10-I70.0). Electronically Signed   By: Morgane  Naveau M.D.   On: 11/11/2023 21:39   DG Hip Unilat With Pelvis 2-3 Views Right Result Date: 11/11/2023 CLINICAL DATA:  med clearance Pt arriving via [facility] for fall from second step. Right hip deformity, EXAM: DG HIP (WITH OR WITHOUT PELVIS) 2-3V RIGHT COMPARISON:  X-ray pelvis 03/13/2010 FINDINGS: Acute markedly superiorly and laterally displaced right intertrochanteric femoral fracture. No right hip dislocation. No acute displaced fracture or dislocation of the left hip. No acute displaced fracture or dislocation of the left hip on frontal view. No acute displaced fracture or diastasis of the bones of the pelvis. There is no evidence of arthropathy or other focal bone abnormality. Degenerative changes of the lower lumbar spine. IMPRESSION: Acute markedly superolaterally displaced right intertrochanteric femoral fracture. Electronically Signed   By: Morgane  Naveau M.D.   On: 11/11/2023 21:37    Procedures Procedures    Medications Ordered in ED Medications  0.9 %  sodium chloride  infusion ( Intravenous  New Bag/Given 11/11/23 2041)  fentaNYL  (SUBLIMAZE ) injection 50 mcg (50 mcg Intravenous Given 11/11/23 2104)  ondansetron  (ZOFRAN ) injection 4 mg (4 mg Intravenous Given 11/11/23 2041)    ED Course/ Medical Decision Making/ A&P                                 Medical Decision Making Amount and/or Complexity of Data Reviewed Independent Historian: EMS External Data Reviewed: notes. Labs: ordered. Decision-making details documented in ED Course. Radiology: ordered and independent interpretation performed. Decision-making details documented in ED Course. ECG/medicine tests: ordered and independent interpretation performed. Decision-making details documented in ED Course.  Risk Prescription drug management. Decision regarding hospitalization.   Pt with multiple medical problems and comorbidities and presenting today with a complaint that caries a high risk for morbidity and mortality.  Here today with a fall and signs of injury to the right hip.  Concern for fracture, dislocation.  Neurovascularly intact at this time.  No head injury and patient does not take anticoagulation.  Hip fracture protocol initiated.  9:42 PM I have independently visualized and interpreted pt's images today.  Intertrochanteric femur fracture with significant displacement.  I independently interpreted patient's labs and EKG and BMP without acute findings other than low calcium of 7.6, CBC with leukocytosis of 14 most likely related to acute phase reaction from the injury, EKG without acute findings today.  Will consult orthopedist for recommendations and patient will require admission to the hospitalist will require surgery.  Spoke with DR. Olin with ortho and he recommended bucks traction and muscle relaxer prn.  Will need surgery          Final Clinical Impression(s) / ED Diagnoses Final diagnoses:  Fall, initial encounter  Intertrochanteric fracture of right hip, closed, initial encounter [facility])    Rx / DC  Orders ED Discharge Orders     None         Almond Army, MD 11/11/23 2142

## 2023-11-11 NOTE — Subjective & Objective (Signed)
 Had a fall, was walking down 2 steps and landed on right side  No head injury no LOC, severe right leg pain Prior hx of HTN HLD

## 2023-11-11 NOTE — Assessment & Plan Note (Signed)
 Patient as she has been hypotensive In the ER blood pressures as low as 70s.  Will observe and progressive overnight.  Monitor blood pressures carefully patient states that her blood pressure has been running low at home as well But her primary care provider has advised her to continue to take her medications Will hold blood pressure meds for now

## 2023-11-11 NOTE — Assessment & Plan Note (Signed)
 Continue Zocor 10 mg when able to tolerate p.o.

## 2023-11-11 NOTE — ED Triage Notes (Signed)
 Pt arriving via [facility] for fall from second step. Right hip deformity, Received 50mcg fentanyl  via EMS

## 2023-11-11 NOTE — Assessment & Plan Note (Signed)
-   management as per orthopedics,  plan to operate   in  a.m.     Keep nothing by mouth post midnight. Patient  not on anticoagulation or antiplatelet agents    Ordered type and screen  order a vitamin D level  Patient at baseline  able to walk a flight of stairs or 100 feet   Patient denies any chest pain or shortness of breath currently and/or with exertion,  ECG showing no evidence of acute ischemia  no known history of coronary artery disease,  COPD   Liver failure or CKD  Given advanced age patient is at least moderate  risk  which has been discussed with her and MPOA #2 but at this point no furthther cardiac workup is indicated.

## 2023-11-11 NOTE — H&P (Signed)
 Janet Cooper MVH:[id number] DOB: 1929-10-17 DOA: 11/11/2023     PCP: Lory Rough., PA-C   Outpatient Specialists:   Patient arrived to ER on 11/11/23 at 1957 Referred by Attending Almond Army, MD   Patient coming from:    home Lives alone,    Chief Complaint:   Chief Complaint  Patient presents with   Fall   Hip Injury    Right    HPI: Janet Cooper is a 89 y.o. female with medical history significant of HTN HLD    Presented with   fall Had a fall, was walking down 2 steps and landed on right side  No head injury no LOC, severe right leg pain Prior hx of HTN HLD   She was walking down 2 steps  Denies feeling dizzy Not lightheaded At baseline walks with a cain At baseline able to walk a 100 feet or one flight of stairs without any CP or SOB Reports her BP has been running low She does have MPOA  She gets freuent UTI but not recently Denies significant ETOH intake   Does not smoke       Regarding pertinent Chronic problems:     Hyperlipidemia - on statins Zocor (simvastatin)  Lipid Panel  No results found for: "CHOL", "TRIG", "HDL", "CHOLHDL", "VLDL", "LDLCALC", "LDLDIRECT", "LABVLDL"   HTN on amlodipine, olmesartan      While in ER:    Displace right intertochanteric frx    Lab Orders         Basic metabolic panel         CBC with Differential         Protime-INR      Right hip Acute markedly superolaterally displaced right intertrochanteric femoral fracture.  CXR -  NON acute    Following Medications were ordered in ER: Medications  0.9 %  sodium chloride  infusion ( Intravenous New Bag/Given 11/11/23 2041)  fentaNYL  (SUBLIMAZE ) injection 50 mcg (50 mcg Intravenous Given 11/11/23 2104)  metoCLOPramide (REGLAN) injection 10 mg (has no administration in time range)  ondansetron  (ZOFRAN ) injection 4 mg (4 mg Intravenous Given 11/11/23 2041)    _______________________________________________________ ER Provider Called:      orthopedics  Dr. Bernard Brick They Recommend admit to medicine   Will see in AM       ED Triage Vitals  Encounter Vitals Group     BP 11/11/23 2006 102/72     Systolic BP Percentile --      Diastolic BP Percentile --      Pulse Rate 11/11/23 2006 (!) 105     Resp 11/11/23 2006 17     Temp 11/11/23 2006 (!) 97.5 F (36.4 C)     Temp Source 11/11/23 2006 Oral     SpO2 11/11/23 2005 98 %     Weight --      Height --      Head Circumference --      Peak Flow --      Pain Score 11/11/23 2008 8     Pain Loc --      Pain Education --      Exclude from Growth Chart --   WUXL(24)@     _________________________________________ Significant initial  Findings: Abnormal Labs Reviewed  BASIC METABOLIC PANEL WITH GFR - Abnormal; Notable for the following components:      Result Value   Chloride 112 (*)    CO2 20 (*)    Glucose, Bld 141 (*)  Calcium 7.6 (*)    All other components within normal limits  CBC WITH DIFFERENTIAL/PLATELET - Abnormal; Notable for the following components:   WBC 14.4 (*)    RBC 3.78 (*)    Neutro Abs 12.4 (*)    Abs Immature Granulocytes 0.10 (*)    All other components within normal limits         ECG: Ordered Personally reviewed and interpreted by me showing: HR : 75 Rhythm:Sinus rhythm Right bundle branch block No significant change since last tracing QTC 454   BNP The recent clinical data is shown below. Vitals:   11/11/23 2035 11/11/23 2105 11/11/23 2115 11/11/23 2130  BP:  93/66 94/74 (!) 91/59  Pulse: 75 77 77 75  Resp: 10 14 12 16   Temp:      TempSrc:      SpO2: 100% 97% 97% 96%    WBC     Component Value Date/Time   WBC 14.4 (H) 11/11/2023 2038   LYMPHSABS 0.9 11/11/2023 2038   MONOABS 0.8 11/11/2023 2038   EOSABS 0.1 11/11/2023 2038   BASOSABS 0.1 11/11/2023 2038       UA  not ordered     Results for orders placed or performed in visit on 03/26/13  Urine culture     Status: None   Collection Time: 03/26/13  1:01 PM  Result  Value Ref Range Status   Colony Count >=100,000 COLONIES/ML  Final   Organism ID, Bacteria ESCHERICHIA COLI  Final      Susceptibility   Escherichia coli -  (no method available)    AMPICILLIN >=32 Resistant     AMPICILLIN/SULBACTAM 8 Sensitive     PIP/TAZO <=4 Sensitive     IMIPENEM <=0.25 Sensitive     CEFAZOLIN  <=4 Sensitive     CEFOXITIN <=4 Sensitive     CEFTRIAXONE <=1 Sensitive     CEFTAZIDIME <=1 Sensitive     CEFEPIME <=1 Sensitive     GENTAMICIN <=1 Sensitive     TOBRAMYCIN <=1 Sensitive     CIPROFLOXACIN  <=0.25 Sensitive     LEVOFLOXACIN <=0.12 Sensitive     NITROFURANTOIN <=16 Sensitive     TRIMETH/SULFA >=320 Resistant     ___________________________________________________ Recent Labs  Lab 11/11/23 2038  NA 138  K 3.6  CO2 20*  GLUCOSE 141*  BUN 18  CREATININE 0.75  CALCIUM 7.6*    Cr    stable,   Lab Results  Component Value Date   CREATININE 0.75 11/11/2023   CREATININE 0.70 10/06/2015    Recent Labs  Lab 11/11/23 2038  AST 18  ALT 15  ALKPHOS 25*  BILITOT 0.5  PROT 5.2*  ALBUMIN 2.8*   Lab Results  Component Value Date   CALCIUM 7.6 (L) 11/11/2023       Plt: Lab Results  Component Value Date   PLT 179 11/11/2023       Recent Labs  Lab 11/11/23 2038  WBC 14.4*  NEUTROABS 12.4*  HGB 12.0  HCT 36.9  MCV 97.6  PLT 179    HG/HCT  stable,     Component Value Date/Time   HGB 12.0 11/11/2023 2038   HCT 36.9 11/11/2023 2038   MCV 97.6 11/11/2023 2038     _______________________________________________ Hospitalist was called for admission for   Fall,   Intertrochanteric fracture of right hip,      The following Work up has been ordered so far:  Orders Placed This Encounter  Procedures   DG Chest  Port 1 View   DG Hip Unilat With Pelvis 2-3 Views Right   Basic metabolic panel   CBC with Differential   Protime-INR   Diet NPO time specified   Check CMS   Bed rest   Initiate Carrier Fluid Protocol   Apply bucks  traction   Consult to orthopedic surgery   Consult to hospitalist   ED EKG   EKG 12-Lead   Type and screen [facility]   ABO/Rh     OTHER Significant initial  Findings:  labs showing:     DM  labs:  HbA1C: No results for input(s): "HGBA1C" in the last 8760 hours.     CBG (last 3)  No results for input(s): "GLUCAP" in the last 72 hours.        Cultures: No results found for: "SDES", "SPECREQUEST", "CULT", "REPTSTATUS"   Radiological Exams on Admission: DG Chest Port 1 View Result Date: 11/11/2023 CLINICAL DATA:  med clearance.  Right hip deformity/fracture EXAM: PORTABLE CHEST 1 VIEW COMPARISON:  None Available. FINDINGS: The heart and mediastinal contours are within normal limits. Atherosclerotic plaque. Left base atelectasis. No focal consolidation. No pulmonary edema. No pleural effusion. No pneumothorax. No acute osseous abnormality. IMPRESSION: 1. No active disease. 2.  Aortic Atherosclerosis (ICD10-I70.0). Electronically Signed   By: Morgane  Naveau M.D.   On: 11/11/2023 21:39   DG Hip Unilat With Pelvis 2-3 Views Right Result Date: 11/11/2023 CLINICAL DATA:  med clearance Pt arriving via [facility] for fall from second step. Right hip deformity, EXAM: DG HIP (WITH OR WITHOUT PELVIS) 2-3V RIGHT COMPARISON:  X-ray pelvis 03/13/2010 FINDINGS: Acute markedly superiorly and laterally displaced right intertrochanteric femoral fracture. No right hip dislocation. No acute displaced fracture or dislocation of the left hip. No acute displaced fracture or dislocation of the left hip on frontal view. No acute displaced fracture or diastasis of the bones of the pelvis. There is no evidence of arthropathy or other focal bone abnormality. Degenerative changes of the lower lumbar spine. IMPRESSION: Acute markedly superolaterally displaced right intertrochanteric femoral fracture. Electronically Signed   By: Morgane  Naveau M.D.   On: 11/11/2023 21:37    _______________________________________________________________________________________________________ Latest  Blood pressure (!) 91/59, pulse 75, temperature (!) 97.5 F (36.4 C), temperature source Oral, resp. rate 16, SpO2 96%.   Vitals  labs and radiology finding personally reviewed  Review of Systems:    Pertinent positives include: fall  Constitutional:  No weight loss, night sweats, Fevers, chills, fatigue, weight loss  HEENT:  No headaches, Difficulty swallowing,Tooth/dental problems,Sore throat,  No sneezing, itching, ear ache, nasal congestion, post nasal drip,  Cardio-vascular:  No chest pain, Orthopnea, PND, anasarca, dizziness, palpitations.no Bilateral lower extremity swelling  GI:  No heartburn, indigestion, abdominal pain, nausea, vomiting, diarrhea, change in bowel habits, loss of appetite, melena, blood in stool, hematemesis Resp:  no shortness of breath at rest. No dyspnea on exertion, No excess mucus, no productive cough, No non-productive cough, No coughing up of blood.No change in color of mucus.No wheezing. Skin:  no rash or lesions. No jaundice GU:  no dysuria, change in color of urine, no urgency or frequency. No straining to urinate.  No flank pain.  Musculoskeletal:  No joint pain or no joint swelling. No decreased range of motion. No back pain.  Psych:  No change in mood or affect. No depression or anxiety. No memory loss.  Neuro: no localizing neurological complaints, no tingling, no weakness, no double vision, no gait abnormality, no slurred  speech, no confusion  All systems reviewed and apart from HOPI all are negative _______________________________________________________________________________________________ Past Medical History:   Past Medical History:  Diagnosis Date   Arthritis    back   Chronic cystitis    Detrusor instability of bladder    Fecal incontinence    Glaucoma of both eyes    History of endometrial cancer    1990 dx   Stage IB, Grade 1 endometrial carcinoma  s/p TAH w/ BSO--  recurrence 1994 vaginal cuff  s/p radiation   History of radiation therapy    1994  lower pelvic area for recurrent endometrial cancer at vaginal cuff   Hyperlipidemia    Hypertension    OAB (overactive bladder)    Osteopenia 05/2013   T score -2.1 FRAX 8.9%/2.5%   Urge urinary incontinence    Wears glasses       Past Surgical History:  Procedure Laterality Date   ANAL RECTAL MANOMETRY N/A 03/29/2014   Procedure: ANO RECTAL MANOMETRY;  Surgeon: Joyce Nixon, MD;  Location: [facility];  Service: Endoscopy;  Laterality: N/A;   ANAL RECTAL MANOMETRY N/A 04/28/2014   Procedure: ANO RECTAL MANOMETRY;  Surgeon: Joyce Nixon, MD;  Location: [facility];  Service: Endoscopy;  Laterality: N/A;   CATARACT EXTRACTION W/ INTRAOCULAR LENS  IMPLANT, BILATERAL  1990's   GLAUCOMA SURGERY Bilateral 2010   laser   PATELLA FRACTURE SURGERY Left 1995   SACRAL NERVE LEAD IMPLANTATION -- STAGE ONE INTERSTIM  09-01-2014   dr Joyce Nixon   fecal incontinence   STAGE TWO SACRAL NERVE STIMULATOR INTERSTIM  09-15-2014   TOTAL ABDOMINAL HYSTERECTOMY W/ BILATERAL SALPINGOOPHORECTOMY  1990    Social History:  Ambulatory   cane,       reports that she has never smoked. She has never used smokeless tobacco. She reports that she does not currently use alcohol after a past usage of about 1.0 standard drink of alcohol per week. She reports that she does not use drugs.     Family History:   Family History  Problem Relation Age of Onset   Colon cancer Mother    Cancer Father        Liver cancer   Heart disease Maternal Grandmother    Heart disease Maternal Grandfather    ______________________________________________________________________________________________ Allergies: Allergies  Allergen Reactions   Homatropine Itching   Hydrocodone Itching   Sulfa Antibiotics Itching   Sulfamethoxazole Itching     Prior to Admission  medications   Medication Sig Start Date End Date Taking? Authorizing Provider  amLODipine-olmesartan (AZOR) 5-40 MG tablet TAKE 1 TABLET BY MOUTH EVERY DAY 12/01/11   [provider]  aspirin 81 MG EC tablet Take by mouth.    [provider]  B Complex-C (B-COMPLEX WITH VITAMIN C) tablet Take by mouth.    [provider]  B Complex-C-Folic Acid (SM B-COMPLEX/VITAMIN C) TABS take 1 tablet by oral route every day    [provider]  Calcium Citrate-Vitamin D 200-250 MG-UNIT TABS Take by mouth.    [provider]  calcium-vitamin D (OSCAL WITH D) 500-200 MG-UNIT per tablet Take 1 tablet by mouth 2 (two) times daily.    [provider]  cephALEXin (KEFLEX) 500 MG capsule Take 500 mg by mouth 3 (three) times daily. 10/18/22   [provider]  Cholecalciferol 25 MCG (1000 UT) capsule     [provider]  dorzolamide (TRUSOPT) 2 % ophthalmic solution SMARTSIG:In Eye(s) 10/30/22   [provider]  dorzolamide-timolol (COSOPT) 22.3-6.8 MG/ML ophthalmic solution Apply to eye.    [provider]  estradiol (ESTRACE) 0.1 MG/GM vaginal cream insert (1G)  by vaginal route 3 times every week    [provider]  GEMTESA 75 MG TABS Take by mouth. 07/12/21   [provider]  glucosamine-chondroitin 500-400 MG tablet Take by mouth.    [provider]  latanoprost (XALATAN) 0.005 % ophthalmic solution INSTILL 1 DROP INTO BOTH EYES AT BEDTIME 12/06/11   [provider]  MAGNESIUM PO Take by mouth.    [provider]  meclizine (ANTIVERT) 12.5 MG tablet TAKE ONE PILL UP TO THREE TIMES PER DAY FOR DIZZINESS 10/01/22   [provider]  methocarbamol  (ROBAXIN ) 500 MG tablet Take 1 tablet (500 mg total) by mouth 2 (two) times daily as needed. 09/15/19   Sandie Cross, PA-C  Omega-3 1000 MG CAPS Take by mouth.    [provider]  omeprazole (PRILOSEC) 20 MG capsule Take 1 tablet  by mouth daily. 02/19/22   [provider]  predniSONE  (STERAPRED UNI-PAK 21 TAB) 10 MG (21) TBPK tablet Take as directed 09/15/19   Dorthy Gavia L, PA-C  PROLIA 60 MG/ML SOSY injection TO BE ADMINISTERED IN PHYSICIAN'S OFFICE. INJECT ONE SYRINGE SUBCUTANEOUSLY ONCE EVERY 6 MONTHS. REFRIGERATE. USE WITHIN 14 DAYS ONCE AT ROOM TEMPERATURE. 05/29/22   [provider]  simvastatin (ZOCOR) 10 MG tablet TAKE 1 TABLET BY MOUTH EVERY DAY NIGHTLY 04/26/15   [provider]  tamsulosin (FLOMAX) 0.4 MG CAPS capsule Take by mouth.    [provider]  UNABLE TO FIND Take by mouth.    [provider]    ___________________________________________________________________________________________________ Physical Exam:    11/11/2023    9:30 PM 11/11/2023    9:15 PM 11/11/2023    9:05 PM  Vitals with BMI  Systolic 91 94 93  Diastolic 59 74 66  Pulse 75 77 77     1. General:  in No  Acute distress   well   -appearing 2. Psychological: Alert and   Oriented 3. Head/ENT:   Dry Mucous Membranes                          Head Non traumatic, neck supple                         Poor Dentition 4. SKIN:  decreased Skin turgor,  Skin clean Dry and intact no rash    5. Heart: Regular rate and rhythm no  Murmur, no Rub or gallop 6. Lungs:  no wheezes or crackles   7. Abdomen: Soft,  non-tender, Non distended bowel sounds present 8. Lower extremities: no clubbing, cyanosis, no  edema 9. Neurologically Grossly intact, moving all 4 extremities equally  10. MSK: Normal range of motion limited in right leg due to pain     Chart has been reviewed  ______________________________________________________________________________________________  Assessment/Plan  89 y.o. female with medical history significant of HTN HLD    Admitted for   Fall,   Intertrochanteric fracture of right hip,       Present on Admission:  Closed right hip fracture (HCC)  Hyperlipidemia   Hypertension     Closed right hip fracture (HCC)  - management as per orthopedics,  plan to operate   in  a.m.     Keep nothing by mouth post midnight. Patient  not on anticoagulation or  antiplatelet agents    Ordered type and screen  order a vitamin D level  Patient at baseline  able to walk a flight of stairs or 100 feet   Patient denies any chest pain or shortness of breath currently and/or with exertion,  ECG showing no evidence of acute ischemia  no known history of coronary artery disease,  COPD   Liver failure or CKD  Given advanced age patient is at least moderate  risk  which has been discussed with her and MPOA #2 but at this point no furthther cardiac workup is indicated.      Hyperlipidemia Continue Zocor 10 mg when able to tolerate p.o.  Hypertension Patient as she has been hypotensive In the ER blood pressures as low as 70s.  Will observe and progressive overnight.  Monitor blood pressures carefully patient states that her blood pressure has been running low at home as well But her primary care provider has advised her to continue to take her medications Will hold blood pressure meds for now   Other plan as per orders.  DVT prophylaxis:  SCD    Code Status:   DNR/DNI   as per patient   I had personally discussed CODE STATUS with patient   ACP   none    Family Communication:   Family not at  Bedside    Diet  Diet Orders (From admission, onward)     Start     Ordered   11/11/23 2018  Diet NPO time specified  Diet effective now        11/11/23 2019            Disposition Plan:     likely will need placement for rehabilitation                            Following barriers for discharge:                            Hip repaired                             Pain controlled with PO medications                                                          Will need consultants to evaluate patient prior to discharge                           Consult Orders   (From admission, onward)           Start     Ordered   11/11/23 2142  Consult to hospitalist  Once       Provider:  (Not yet assigned)  Question Answer Comment  Place call to: [facility]   Reason for Consult Admit      11/11/23 2141                               Consults called: orthopedics   Admission status:  ED Disposition     ED Disposition  Admit   Condition  --  Comment  The patient appears reasonably stabilized for admission considering the current resources, flow, and capabilities available in the ED at this time, and I doubt any other [facility] requiring further screening and/or treatment in the ED prior to admission is  present.             inpatient     I Expect 2 midnight stay secondary to severity of patient's current illness need for inpatient interventions justified by the following:  hemodynamic instability despite optimal treatment  hypotension )   Severe lab/radiological/exam abnormalities including:      Intertrochanteric fracture of right hip     :  That are currently affecting medical management.   I expect  patient to be hospitalized for 2 midnights requiring inpatient medical care.  Patient is at high risk for adverse outcome (such as loss of life or disability) if not treated.  Indication for inpatient stay as follows:  Severe change from baseline regarding mental status Hemodynamic instability despite maximal medical therapy,  severe pain requiring acute inpatient management,  inability to maintain oral hydration   persistent chest pain despite medical management Need for operative/procedural  intervention New or worsening hypoxia ongoing suicidal ideations   Need for , IV fluids,, IV pain medications,    Level of care      progressive       Mohd Clemons 11/11/2023, 11:47 PM    [facility]     after 2 AM please page floor coverage   If 7AM-7PM, please contact the day team taking care of the patient  using [email or web address]

## 2023-11-12 ENCOUNTER — Inpatient Hospital Stay (HOSPITAL_COMMUNITY)

## 2023-11-12 ENCOUNTER — Other Ambulatory Visit: Payer: Self-pay

## 2023-11-12 ENCOUNTER — Encounter (HOSPITAL_COMMUNITY)
Admission: EM | Disposition: A | Discharge status: Skilled Nursing Facility | Payer: Self-pay | Source: Home / Self Care | Attending: Internal Medicine

## 2023-11-12 ENCOUNTER — Inpatient Hospital Stay (HOSPITAL_COMMUNITY): Admitting: Anesthesiology

## 2023-11-12 ENCOUNTER — Encounter (HOSPITAL_COMMUNITY): Payer: Self-pay | Admitting: Internal Medicine

## 2023-11-12 DIAGNOSIS — S72001A Fracture of unspecified part of neck of right femur, initial encounter for closed fracture: Secondary | ICD-10-CM | POA: Diagnosis not present

## 2023-11-12 DIAGNOSIS — Z96641 Presence of right artificial hip joint: Secondary | ICD-10-CM

## 2023-11-12 DIAGNOSIS — S72001P Fracture of unspecified part of neck of right femur, subsequent encounter for closed fracture with malunion: Secondary | ICD-10-CM

## 2023-11-12 DIAGNOSIS — E440 Moderate protein-calorie malnutrition: Secondary | ICD-10-CM

## 2023-11-12 DIAGNOSIS — I959 Hypotension, unspecified: Secondary | ICD-10-CM | POA: Diagnosis not present

## 2023-11-12 DIAGNOSIS — D649 Anemia, unspecified: Secondary | ICD-10-CM | POA: Diagnosis not present

## 2023-11-12 DIAGNOSIS — S72141A Displaced intertrochanteric fracture of right femur, initial encounter for closed fracture: Secondary | ICD-10-CM

## 2023-11-12 DIAGNOSIS — E785 Hyperlipidemia, unspecified: Secondary | ICD-10-CM | POA: Diagnosis not present

## 2023-11-12 DIAGNOSIS — R571 Hypovolemic shock: Secondary | ICD-10-CM

## 2023-11-12 LAB — CBC
HCT: 25.3 % — ABNORMAL LOW (ref 36.0–46.0)
HCT: 22.8 % — ABNORMAL LOW (ref 36.0–46.0)
Hemoglobin: 8.2 g/dL — ABNORMAL LOW (ref 12.0–15.0)
Hemoglobin: 7.1 g/dL — ABNORMAL LOW (ref 12.0–15.0)
MCH: 32.5 pg (ref 26.0–34.0)
MCH: 32.6 pg (ref 26.0–34.0)
MCHC: 32.4 g/dL (ref 30.0–36.0)
MCHC: 31.1 g/dL (ref 30.0–36.0)
MCV: 100.4 fL — ABNORMAL HIGH (ref 80.0–100.0)
MCV: 104.6 fL — ABNORMAL HIGH (ref 80.0–100.0)
Platelets: 162 10*3/uL (ref 150–400)
Platelets: 108 10*3/uL — ABNORMAL LOW (ref 150–400)
RBC: 2.52 MIL/uL — ABNORMAL LOW (ref 3.87–5.11)
RBC: 2.18 MIL/uL — ABNORMAL LOW (ref 3.87–5.11)
RDW: 13.2 % (ref 11.5–15.5)
RDW: 13.9 % (ref 11.5–15.5)
WBC: 7.8 10*3/uL (ref 4.0–10.5)
WBC: 8.8 10*3/uL (ref 4.0–10.5)
nRBC: 0 % (ref 0.0–0.2)
nRBC: 0 % (ref 0.0–0.2)

## 2023-11-12 LAB — COMPREHENSIVE METABOLIC PANEL WITH GFR
ALT: 14 U/L (ref 0–44)
AST: 19 U/L (ref 15–41)
Albumin: 2.4 g/dL — ABNORMAL LOW (ref 3.5–5.0)
Alkaline Phosphatase: 20 U/L — ABNORMAL LOW (ref 38–126)
Anion gap: 7 (ref 5–15)
BUN: 17 mg/dL (ref 8–23)
CO2: 17 mmol/L — ABNORMAL LOW (ref 22–32)
Calcium: 7.1 mg/dL — ABNORMAL LOW (ref 8.9–10.3)
Chloride: 115 mmol/L — ABNORMAL HIGH (ref 98–111)
Creatinine, Ser: 0.83 mg/dL (ref 0.44–1.00)
GFR, Estimated: >60 mL/min (ref >60)
Glucose, Bld: 109 mg/dL — ABNORMAL HIGH (ref 70–99)
Potassium: 3.8 mmol/L (ref 3.5–5.1)
Sodium: 139 mmol/L (ref 135–145)
Total Bilirubin: 0.8 mg/dL (ref 0.0–1.2)
Total Protein: 4.5 g/dL — ABNORMAL LOW (ref 6.5–8.1)

## 2023-11-12 LAB — PREPARE RBC (CROSSMATCH)

## 2023-11-12 LAB — URINALYSIS, COMPLETE (UACMP) WITH MICROSCOPIC
Bacteria, UA: NONE SEEN
Bilirubin Urine: NEGATIVE
Glucose, UA: NEGATIVE mg/dL
Hgb urine dipstick: NEGATIVE
Ketones, ur: 20 mg/dL — AB
Leukocytes,Ua: NEGATIVE
Nitrite: NEGATIVE
Protein, ur: NEGATIVE mg/dL
Specific Gravity, Urine: 1.014 (ref 1.005–1.030)
pH: 5 (ref 5.0–8.0)

## 2023-11-12 LAB — ABO/RH: ABO/RH(D): O POS

## 2023-11-12 LAB — RETICULOCYTES
Immature Retic Fract: 11.9 % (ref 2.3–15.9)
RBC.: 2.16 MIL/uL — ABNORMAL LOW (ref 3.87–5.11)
Retic Count, Absolute: 48.4 10*3/uL (ref 19.0–186.0)
Retic Ct Pct: 2.2 % (ref 0.4–3.1)

## 2023-11-12 LAB — TSH: TSH: 0.903 u[IU]/mL (ref 0.350–4.500)

## 2023-11-12 LAB — VITAMIN D 25 HYDROXY (VIT D DEFICIENCY, FRACTURES): Vit D, 25-Hydroxy: 50.66 ng/mL (ref 30–100)

## 2023-11-12 SURGERY — ARTHROPLASTY, HIP, TOTAL, ANTERIOR APPROACH
Anesthesia: General | Site: Hip | Laterality: Right

## 2023-11-12 MED ORDER — DEXAMETHASONE SODIUM PHOSPHATE 10 MG/ML IJ SOLN
INTRAMUSCULAR | Status: AC
  Filled 2023-11-12: qty 1

## 2023-11-12 MED ORDER — ONDANSETRON HCL 4 MG/2ML IJ SOLN
INTRAMUSCULAR | Status: AC
  Filled 2023-11-12: qty 2

## 2023-11-12 MED ORDER — METHOCARBAMOL 1000 MG/10ML IJ SOLN: 500.0000 mg | Freq: Four times a day (QID) | INTRAMUSCULAR | Status: DC | PRN

## 2023-11-12 MED ORDER — KETOROLAC TROMETHAMINE 30 MG/ML IJ SOLN
INTRAMUSCULAR | Status: AC
Start: 2023-11-12 — End: ?
  Filled 2023-11-12: qty 1

## 2023-11-12 MED ORDER — STERILE WATER FOR IRRIGATION IR SOLN
Status: DC | PRN
Start: 2023-11-12 — End: 2023-11-12
  Administered 2023-11-12: 1000 mL

## 2023-11-12 MED ORDER — POVIDONE-IODINE 10 % EX SWAB
2.0000 | Freq: Once | CUTANEOUS | Status: AC
  Administered 2023-11-12: 2 via TOPICAL

## 2023-11-12 MED ORDER — TRANEXAMIC ACID-NACL 1000-0.7 MG/100ML-% IV SOLN
1000.0000 mg | INTRAVENOUS | Status: AC
  Administered 2023-11-12: 1000 mg via INTRAVENOUS
  Filled 2023-11-12: qty 100

## 2023-11-12 MED ORDER — SODIUM CHLORIDE 0.9% IV SOLUTION
Freq: Once | INTRAVENOUS | Status: DC
Start: 2023-11-12 — End: 2023-11-13

## 2023-11-12 MED ORDER — ROCURONIUM BROMIDE 10 MG/ML (PF) SYRINGE
PREFILLED_SYRINGE | INTRAVENOUS | Status: DC | PRN
  Administered 2023-11-12: 20 mg via INTRAVENOUS

## 2023-11-12 MED ORDER — CHLORHEXIDINE GLUCONATE CLOTH 2 % EX PADS: MEDICATED_PAD | Freq: Once | CUTANEOUS | Status: DC

## 2023-11-12 MED ORDER — HYDROMORPHONE HCL 1 MG/ML IJ SOLN: 0.5000 mg | INTRAMUSCULAR | Status: DC | PRN

## 2023-11-12 MED ORDER — ONDANSETRON HCL 4 MG/2ML IJ SOLN: 4.0000 mg | Freq: Four times a day (QID) | INTRAMUSCULAR | Status: DC | PRN

## 2023-11-12 MED ORDER — VASOPRESSIN 20 UNIT/ML IV SOLN
INTRAVENOUS | Status: AC
  Filled 2023-11-12: qty 1

## 2023-11-12 MED ORDER — ONDANSETRON HCL 4 MG/2ML IJ SOLN
INTRAMUSCULAR | Status: DC | PRN
  Administered 2023-11-12: 4 mg via INTRAVENOUS

## 2023-11-12 MED ORDER — SODIUM CHLORIDE 0.9 % IV BOLUS
500.0000 mL | Freq: Once | INTRAVENOUS | Status: AC
  Administered 2023-11-12: 500 mL via INTRAVENOUS

## 2023-11-12 MED ORDER — ONDANSETRON HCL 4 MG PO TABS: 4.0000 mg | ORAL_TABLET | Freq: Four times a day (QID) | ORAL | Status: DC | PRN

## 2023-11-12 MED ORDER — ACETAMINOPHEN 500 MG PO TABS
1000.0000 mg | ORAL_TABLET | Freq: Once | ORAL | Status: AC
  Administered 2023-11-12: 1000 mg via ORAL
  Filled 2023-11-12: qty 2

## 2023-11-12 MED ORDER — BUPIVACAINE-EPINEPHRINE (PF) 0.25% -1:200000 IJ SOLN
INTRAMUSCULAR | Status: DC | PRN
Start: 2023-11-12 — End: 2023-11-12
  Administered 2023-11-12: 30 mL

## 2023-11-12 MED ORDER — CHLORHEXIDINE GLUCONATE CLOTH 2 % EX PADS
6.0000 | MEDICATED_PAD | Freq: Every day | CUTANEOUS | Status: DC
  Administered 2023-11-12 – 2023-11-20 (×9): 6 via TOPICAL

## 2023-11-12 MED ORDER — ORAL CARE MOUTH RINSE: 15.0000 mL | OROMUCOSAL | Status: DC | PRN

## 2023-11-12 MED ORDER — OXYCODONE HCL 5 MG PO TABS
2.5000 mg | ORAL_TABLET | ORAL | Status: DC | PRN
  Administered 2023-11-18: 2.5 mg via ORAL
  Administered 2023-11-18: 5 mg via ORAL
  Filled 2023-11-12 (×4): qty 1

## 2023-11-12 MED ORDER — ACETAMINOPHEN 500 MG PO TABS
1000.0000 mg | ORAL_TABLET | Freq: Four times a day (QID) | ORAL | Status: AC
  Administered 2023-11-12 – 2023-11-16 (×14): 1000 mg via ORAL
  Filled 2023-11-12 (×15): qty 2

## 2023-11-12 MED ORDER — CEFAZOLIN SODIUM-DEXTROSE 2-4 GM/100ML-% IV SOLN
2.0000 g | INTRAVENOUS | Status: AC
  Administered 2023-11-12: 2 g via INTRAVENOUS
  Filled 2023-11-12: qty 100

## 2023-11-12 MED ORDER — SUGAMMADEX SODIUM 200 MG/2ML IV SOLN
INTRAVENOUS | Status: DC | PRN
  Administered 2023-11-12: 100 mg via INTRAVENOUS

## 2023-11-12 MED ORDER — VASOPRESSIN 20 UNIT/ML IV SOLN
INTRAVENOUS | Status: DC | PRN
  Administered 2023-11-12 (×2): 1 [IU] via INTRAVENOUS

## 2023-11-12 MED ORDER — SODIUM CHLORIDE (PF) 0.9 % IJ SOLN
INTRAMUSCULAR | Status: DC | PRN
  Administered 2023-11-12: 30 mL

## 2023-11-12 MED ORDER — PANTOPRAZOLE SODIUM 40 MG PO TBEC
40.0000 mg | DELAYED_RELEASE_TABLET | Freq: Every day | ORAL | Status: DC
  Administered 2023-11-12 – 2023-11-20 (×9): 40 mg via ORAL
  Filled 2023-11-12 (×9): qty 1

## 2023-11-12 MED ORDER — FENTANYL CITRATE PF 50 MCG/ML IJ SOSY
PREFILLED_SYRINGE | INTRAMUSCULAR | Status: AC
  Filled 2023-11-12: qty 2

## 2023-11-12 MED ORDER — 0.9 % SODIUM CHLORIDE (POUR BTL) OPTIME
TOPICAL | Status: DC | PRN
Start: 2023-11-12 — End: 2023-11-12
  Administered 2023-11-12: 1000 mL

## 2023-11-12 MED ORDER — BUPIVACAINE-EPINEPHRINE (PF) 0.25% -1:200000 IJ SOLN
INTRAMUSCULAR | Status: AC
  Filled 2023-11-12: qty 30

## 2023-11-12 MED ORDER — METOCLOPRAMIDE HCL 5 MG/ML IJ SOLN
5.0000 mg | Freq: Three times a day (TID) | INTRAMUSCULAR | Status: DC | PRN
  Administered 2023-11-14 – 2023-11-15 (×2): 5 mg via INTRAVENOUS
  Filled 2023-11-12 (×2): qty 2

## 2023-11-12 MED ORDER — CALCIUM CITRATE-VITAMIN D 200-250 MG-UNIT PO TABS: 1.0000 | ORAL_TABLET | Freq: Every day | ORAL | Status: DC

## 2023-11-12 MED ORDER — ONDANSETRON HCL 4 MG/2ML IJ SOLN
4.0000 mg | Freq: Four times a day (QID) | INTRAMUSCULAR | Status: DC | PRN
  Administered 2023-11-14 – 2023-11-16 (×4): 4 mg via INTRAVENOUS
  Filled 2023-11-12 (×4): qty 2

## 2023-11-12 MED ORDER — PROPOFOL 10 MG/ML IV BOLUS
INTRAVENOUS | Status: DC | PRN
  Administered 2023-11-12: 60 mg via INTRAVENOUS

## 2023-11-12 MED ORDER — SODIUM CHLORIDE 0.9 % IV SOLN: INTRAVENOUS | Status: DC

## 2023-11-12 MED ORDER — OXYCODONE HCL 5 MG/5ML PO SOLN: 5.0000 mg | Freq: Once | ORAL | Status: DC | PRN

## 2023-11-12 MED ORDER — PHENYLEPHRINE HCL-NACL 20-0.9 MG/250ML-% IV SOLN
INTRAVENOUS | Status: DC | PRN
Start: 2023-11-12 — End: 2023-11-12
  Administered 2023-11-12: 50 ug/min via INTRAVENOUS

## 2023-11-12 MED ORDER — SUCCINYLCHOLINE CHLORIDE 200 MG/10ML IV SOSY
PREFILLED_SYRINGE | INTRAVENOUS | Status: DC | PRN
  Administered 2023-11-12: 100 mg via INTRAVENOUS

## 2023-11-12 MED ORDER — DROPERIDOL 2.5 MG/ML IJ SOLN
INTRAMUSCULAR | Status: AC
  Filled 2023-11-12: qty 2

## 2023-11-12 MED ORDER — SODIUM CHLORIDE 0.9 % IV SOLN: 10.0000 mL/h | Freq: Once | INTRAVENOUS | Status: DC

## 2023-11-12 MED ORDER — DEXAMETHASONE SODIUM PHOSPHATE 10 MG/ML IJ SOLN
INTRAMUSCULAR | Status: DC | PRN
  Administered 2023-11-12: 5 mg via INTRAVENOUS

## 2023-11-12 MED ORDER — KETOROLAC TROMETHAMINE 30 MG/ML IJ SOLN
INTRAMUSCULAR | Status: AC
  Filled 2023-11-12: qty 1

## 2023-11-12 MED ORDER — CHLORHEXIDINE GLUCONATE 0.12 % MT SOLN
15.0000 mL | Freq: Once | OROMUCOSAL | Status: AC
  Administered 2023-11-12: 15 mL via OROMUCOSAL

## 2023-11-12 MED ORDER — TRIMETHOPRIM 100 MG PO TABS
100.0000 mg | ORAL_TABLET | Freq: Every evening | ORAL | Status: DC
  Administered 2023-11-13 – 2023-11-19 (×7): 100 mg via ORAL
  Filled 2023-11-12 (×9): qty 1

## 2023-11-12 MED ORDER — ROPIVACAINE HCL 5 MG/ML IJ SOLN
INTRAMUSCULAR | Status: DC | PRN
  Administered 2023-11-12: 30 mL via PERINEURAL

## 2023-11-12 MED ORDER — DROPERIDOL 2.5 MG/ML IJ SOLN
INTRAMUSCULAR | Status: DC | PRN
  Administered 2023-11-12: .3125 mg via INTRAVENOUS

## 2023-11-12 MED ORDER — ALBUMIN HUMAN 5 % IV SOLN
INTRAVENOUS | Status: DC | PRN
Start: 2023-11-12 — End: 2023-11-12

## 2023-11-12 MED ORDER — LIDOCAINE HCL (CARDIAC) PF 100 MG/5ML IV SOSY
PREFILLED_SYRINGE | INTRAVENOUS | Status: DC | PRN
  Administered 2023-11-12: 50 mg via INTRAVENOUS

## 2023-11-12 MED ORDER — ALBUMIN HUMAN 5 % IV SOLN
INTRAVENOUS | Status: AC
  Filled 2023-11-12: qty 250

## 2023-11-12 MED ORDER — METOCLOPRAMIDE HCL 5 MG PO TABS
5.0000 mg | ORAL_TABLET | Freq: Three times a day (TID) | ORAL | Status: DC | PRN
  Administered 2023-11-13: 5 mg via ORAL
  Filled 2023-11-12 (×2): qty 1

## 2023-11-12 MED ORDER — SUCCINYLCHOLINE CHLORIDE 200 MG/10ML IV SOSY
PREFILLED_SYRINGE | INTRAVENOUS | Status: AC
  Filled 2023-11-12: qty 10

## 2023-11-12 MED ORDER — SODIUM CHLORIDE (PF) 0.9 % IJ SOLN
INTRAMUSCULAR | Status: AC
  Filled 2023-11-12: qty 30

## 2023-11-12 MED ORDER — ROCURONIUM BROMIDE 10 MG/ML (PF) SYRINGE
PREFILLED_SYRINGE | INTRAVENOUS | Status: AC
  Filled 2023-11-12: qty 10

## 2023-11-12 MED ORDER — ONDANSETRON HCL 4 MG/2ML IJ SOLN
4.0000 mg | Freq: Once | INTRAMUSCULAR | Status: AC | PRN
  Administered 2023-11-12: 4 mg via INTRAVENOUS

## 2023-11-12 MED ORDER — FENTANYL CITRATE PF 50 MCG/ML IJ SOSY: 25.0000 ug | PREFILLED_SYRINGE | INTRAMUSCULAR | Status: DC | PRN

## 2023-11-12 MED ORDER — TRANEXAMIC ACID-NACL 1000-0.7 MG/100ML-% IV SOLN
1000.0000 mg | Freq: Once | INTRAVENOUS | Status: AC
Start: 2023-11-12 — End: 2023-11-13
  Administered 2023-11-13: 1000 mg via INTRAVENOUS
  Filled 2023-11-12: qty 100

## 2023-11-12 MED ORDER — FENTANYL CITRATE (PF) 100 MCG/2ML IJ SOLN
INTRAMUSCULAR | Status: AC
Start: 2023-11-12 — End: ?
  Filled 2023-11-12: qty 2

## 2023-11-12 MED ORDER — FENTANYL CITRATE PF 50 MCG/ML IJ SOSY
50.0000 ug | PREFILLED_SYRINGE | INTRAMUSCULAR | Status: AC
  Administered 2023-11-12: 50 ug via INTRAVENOUS

## 2023-11-12 MED ORDER — METHOCARBAMOL 500 MG PO TABS: 500.0000 mg | ORAL_TABLET | Freq: Four times a day (QID) | ORAL | Status: DC | PRN

## 2023-11-12 MED ORDER — OXYCODONE HCL 5 MG PO TABS
5.0000 mg | ORAL_TABLET | ORAL | Status: DC | PRN
  Administered 2023-11-19 – 2023-11-20 (×4): 5 mg via ORAL
  Filled 2023-11-12 (×4): qty 1

## 2023-11-12 MED ORDER — SODIUM CHLORIDE 0.9 % IV SOLN: INTRAVENOUS | Status: DC | PRN

## 2023-11-12 MED ORDER — LACTATED RINGERS IV SOLN
INTRAVENOUS | Status: DC | PRN
Start: 2023-11-12 — End: 2023-11-12

## 2023-11-12 MED ORDER — ALBUMIN HUMAN 25 % IV SOLN
25.0000 g | Freq: Once | INTRAVENOUS | Status: AC
  Administered 2023-11-12: 25 g via INTRAVENOUS
  Filled 2023-11-12: qty 100

## 2023-11-12 MED ORDER — OXYCODONE HCL 5 MG PO TABS: 5.0000 mg | ORAL_TABLET | Freq: Once | ORAL | Status: DC | PRN

## 2023-11-12 MED ORDER — SODIUM CHLORIDE 0.9% IV SOLUTION: Freq: Once | INTRAVENOUS | Status: DC

## 2023-11-12 MED ORDER — FENTANYL CITRATE PF 50 MCG/ML IJ SOSY
50.0000 ug | PREFILLED_SYRINGE | Freq: Once | INTRAMUSCULAR | Status: AC
  Administered 2023-11-12: 50 ug via INTRAVENOUS
  Filled 2023-11-12: qty 1

## 2023-11-12 MED ORDER — KETOROLAC TROMETHAMINE 30 MG/ML IJ SOLN
INTRAMUSCULAR | Status: DC | PRN
  Administered 2023-11-12: 30 mg via INTRAMUSCULAR

## 2023-11-12 MED ORDER — FENTANYL CITRATE (PF) 100 MCG/2ML IJ SOLN
INTRAMUSCULAR | Status: DC | PRN
  Administered 2023-11-12 (×2): 50 ug via INTRAVENOUS

## 2023-11-12 MED ORDER — CEFAZOLIN SODIUM-DEXTROSE 2-4 GM/100ML-% IV SOLN
2.0000 g | Freq: Four times a day (QID) | INTRAVENOUS | Status: AC
  Administered 2023-11-12 – 2023-11-13 (×2): 2 g via INTRAVENOUS
  Filled 2023-11-12 (×2): qty 100

## 2023-11-12 SURGICAL SUPPLY — 37 items
BAG COUNTER SPONGE SURGICOUNT (BAG) IMPLANT
BAG ZIPLOCK 12X15 (MISCELLANEOUS) IMPLANT
BLADE SAG 18X100X1.27 (BLADE) ×1 IMPLANT
COVER PERINEAL POST (MISCELLANEOUS) ×1 IMPLANT
COVER SURGICAL LIGHT HANDLE (MISCELLANEOUS) ×1 IMPLANT
CUP ACET PINNACLE SECTR 50MM (Hips) IMPLANT
DERMABOND ADVANCED .7 DNX12 (GAUZE/BANDAGES/DRESSINGS) ×1 IMPLANT
DRAPE FOOT SWITCH (DRAPES) ×1 IMPLANT
DRAPE STERI IOBAN 125X83 (DRAPES) ×1 IMPLANT
DRAPE U-SHAPE 47X51 STRL (DRAPES) ×2 IMPLANT
DRESSING AQUACEL AG SP 3.5X10 (GAUZE/BANDAGES/DRESSINGS) ×1 IMPLANT
DURAPREP 26ML APPLICATOR (WOUND CARE) ×1 IMPLANT
ELECT REM PT RETURN 15FT ADLT (MISCELLANEOUS) ×1 IMPLANT
GLOVE BIO SURGEON STRL SZ 6 (GLOVE) ×1 IMPLANT
GLOVE BIOGEL PI IND STRL 6.5 (GLOVE) ×1 IMPLANT
GLOVE BIOGEL PI IND STRL 7.5 (GLOVE) ×1 IMPLANT
GLOVE ORTHO TXT STRL SZ7.5 (GLOVE) ×2 IMPLANT
GOWN STRL REUS W/ TWL LRG LVL3 (GOWN DISPOSABLE) ×2 IMPLANT
HEAD FEM STD 32X+5 STRL (Hips) IMPLANT
HOLDER FOLEY CATH W/STRAP (MISCELLANEOUS) ×1 IMPLANT
KIT TURNOVER KIT A (KITS) IMPLANT
LINER ACET PNNCL PLUS4 NEUTRAL (Hips) IMPLANT
MANIFOLD NEPTUNE II (INSTRUMENTS) ×1 IMPLANT
NDL SAFETY ECLIPSE 18X1.5 (NEEDLE) IMPLANT
PACK ANTERIOR HIP CUSTOM (KITS) ×1 IMPLANT
PENCIL SMOKE EVACUATOR (MISCELLANEOUS) ×1 IMPLANT
SCREW 6.5MMX30MM (Screw) IMPLANT
STEM FEM CMTLS STD RECLAIM 13 (Stem) IMPLANT
SUT MNCRL AB 4-0 PS2 18 (SUTURE) ×1 IMPLANT
SUT VIC AB 1 CT1 36 (SUTURE) ×3 IMPLANT
SUT VIC AB 2-0 CT1 TAPERPNT 27 (SUTURE) ×2 IMPLANT
SUTURE STRATFX 0 PDS 27 VIOLET (SUTURE) ×1 IMPLANT
SYR 3ML LL SCALE MARK (SYRINGE) IMPLANT
TOWEL GREEN STERILE FF (TOWEL DISPOSABLE) ×1 IMPLANT
TRAY FOLEY MTR SLVR 16FR STAT (SET/KITS/TRAYS/PACK) ×1 IMPLANT
TUBE SUCTION HIGH CAP CLEAR NV (SUCTIONS) ×1 IMPLANT
WATER STERILE IRR 1000ML POUR (IV SOLUTION) ×1 IMPLANT

## 2023-11-12 NOTE — Anesthesia Preprocedure Evaluation (Signed)
 Anesthesia Evaluation  Patient identified by MRN, date of birth, ID band Patient awake    Reviewed: Allergy & Precautions, NPO status , Patient's Chart, lab work & pertinent test results, reviewed documented beta blocker date and time   History of Anesthesia Complications Negative for: history of anesthetic complications  Airway Mallampati: III  TM Distance: >3 FB     Dental no notable dental hx.    Pulmonary    breath sounds clear to auscultation       Cardiovascular hypertension, (-) CAD, (-) Past MI and (-) Cardiac Stents  Rhythm:Regular Rate:Normal     Neuro/Psych  PSYCHIATRIC DISORDERS  Depression     Neuromuscular disease    GI/Hepatic   Endo/Other    Renal/GU      Musculoskeletal  (+) Arthritis , Osteoarthritis,    Abdominal   Peds  Hematology   Anesthesia Other Findings   Reproductive/Obstetrics                             Anesthesia Physical Anesthesia Plan  ASA: 3  Anesthesia Plan: Regional   Post-op Pain Management: Regional block*   Induction:   PONV Risk Score and Plan:   Airway Management Planned:   Additional Equipment:   Intra-op Plan:   Post-operative Plan:   Informed Consent: I have reviewed the patients History and Physical, chart, labs and discussed the procedure including the risks, benefits and alternatives for the proposed anesthesia with the patient or authorized representative who has indicated his/her understanding and acceptance.       Plan Discussed with:   Anesthesia Plan Comments:        Anesthesia Quick Evaluation

## 2023-11-12 NOTE — Anesthesia Procedure Notes (Signed)
 Anesthesia Regional Block: Femoral nerve block   Pre-Anesthetic Checklist: , timeout performed,  Correct Patient, Correct Site, Correct Laterality,  Correct Procedure, Correct Position, site marked,  Risks and benefits discussed,  Surgical consent,  Pre-op evaluation,  At surgeon's request and post-op pain management  Laterality: Right  Prep: Maximum Sterile Barrier Precautions used, chloraprep       Needles:  Injection technique: Single-shot  Needle Type: Echogenic Needle      Needle Gauge: 20     Additional Needles:   Procedures:,,,, ultrasound used (permanent image in chart),,    Narrative:  Start time: 11/12/2023 9:40 AM End time: 11/12/2023 9:45 AM Injection made incrementally with aspirations every 5 mL.  Performed by: Personally  Anesthesiologist: Leslye Rast, MD

## 2023-11-12 NOTE — Consult Note (Addendum)
 NAME:  Janet Cooper, MRN:  [id number], DOB:  27-Apr-1930, LOS: 1 ADMISSION DATE:  11/11/2023 CONSULTATION DATE:  11/12/2023 REFERRING MD:  Hildy Lowers - TRH, CHIEF COMPLAINT:  Fall, now with hypotension/shock   History of Present Illness:  A 89 year old woman who presented to [facility] 6/9 for fall. PMHx significant for HTN, HLD, chronic cystitis/frequent UTIs, OAB, endometrial CA (stage 1B s/p TAH/BSO, s/p radiation) 1990-1994.  Per chart review, patient was walking down steps at home (2) and fell, landing on R side. No head strike/LOC or dizziness. Patient noted severe RLE pain post-fall. Imaging demonstrated acute markedly superolaterally displaced R intertrochanteric femoral fracture; Orthopedics was consulted.  Labs were notable for WBC 14.4, Hgb 12.0, Plt 179. INR 1.0. Na 138, K 3.6, CO2 20, BUN/Cr 18/0.75. Phos 3.1, Mg 2.1. CK 111. Vit D 50.66. LFTs WNL. UA whazy with +ketones (20), otherwise unremarkable.   Underwent R THA 6/10 with Ortho (Dr. Bernard Brick); patent tolerated well. EBL . Postoperatively, patient was extubated. Patient became borderline hypotensive 6/10 requiring vasopressor initiation (Neosynephrine) and given a bolus 1 L NS 0.9% and Albumin 25 gm.  She has borderline low BP and vertigo on/off for 6 months (?dizziness) and she was instructed to continue BP meds anyway. She has no hx of heart disease or strokes.   Denied CP, SOB, f/c/r, N/V/D, and LL edema  PCCM consulted for hypotension with concern for shock.   Pertinent Medical History:   Past Medical History:  Diagnosis Date   Arthritis    back   Chronic cystitis    Detrusor instability of bladder    Fecal incontinence    Glaucoma of both eyes    History of endometrial cancer    1990 dx  Stage IB, Grade 1 endometrial carcinoma  s/p TAH w/ BSO--  recurrence 1994 vaginal cuff  s/p radiation   History of radiation therapy    1994  lower pelvic area for recurrent endometrial cancer at vaginal cuff   Hyperlipidemia     Hypertension    OAB (overactive bladder)    Osteopenia 05/2013   T score -2.1 FRAX 8.9%/2.5%   Urge urinary incontinence    Wears glasses    Significant Hospital Events: Including procedures, antibiotic start and stop dates in addition to other pertinent events   6/9 - Presented to [facility] ED after fall at home with R-sided leg pain. CT showed acute markedly superolaterally displaced R intertrochanteric femoral fracture; started Neosynephrine.  Interim History / Subjective:  PCCM consulted fo ICU management.  Objective:   Blood pressure (!) 108/50, pulse 88, temperature 98.2 F (36.8 C), temperature source Oral, resp. rate 18, height 5' (1.524 m), weight 48.1 kg, SpO2 100%.        Intake/Output Summary (Last 24 hours) at 11/12/2023 2356 Last data filed at 11/12/2023 1734 Gross per 24 hour  Intake 1665 ml  Output 1285 ml  Net 380 ml   Filed Weights   11/12/23 1411  Weight: 48.1 kg   Physical Examination: General: Acutely ill-appearing in NAD. HEENT: [place]/AT, anicteric sclera, PERRL, moist mucous membranes. Neuro: Awake, oriented x 4. Responds to verbal stimuli. Following commands consistently. Moves all 4 extremities spontaneously. Strength 5/5 in all extremities.  CV: RRR, no m/g/r. PULM: Breathing even and unlabored on 2 L [place]. Lung fields clear. GI: Soft, nontender, nondistended. Normoactive bowel sounds. Extremities: no LE edema noted, symmetrical. Right hip dressing is clean. Skin: Warm/dry, no rash.  Resolved Hospital Problem List:   Assessment & Plan:  Shock,  due to postop acute blood loss, sedatives, pain meds, and Robaxin . Possibly hypovolemic  Admit to ICU - Goal MAP > 65 mmHg - Fluid resuscitation as tolerated: LR - I/O chart - Neopsynephrine titrated to goal MAP - Trend WBC, fever curve, LA - F/u Cx data - Continue antibiotics (Ancef ) - Midodrine - Echo - PRBC Tx: total 2 units  HypoCa IV Ca x1, check ionized Ca in am  HTN HLD - Cardiac monitoring -  Optimize electrolytes for K > 4, Mg > 2 - AC contraindicated at present; s/p TXA given. - d/c home BP meds - Resume ASA upon discharge - SCD  Chronic cystitis/frequent UTIs OAB - On prophylactic Abx  Right hip fracture, s/p total hip replacement   Moderate PCM -Dietitian consult  Best Practice: (right click and Reselect all SmartList Selections daily)   Diet/type: NPO w/ oral meds DVT prophylaxis: SCD GI prophylaxis: PPI Lines: N/A Foley:  Yes, and it is still needed Code Status:  DNR Last date of multidisciplinary goals of care discussion []   Labs:  CBC: Recent Labs  Lab 11/11/23 2038 11/12/23 1259 11/12/23 2021  WBC 14.4* 7.8 8.8  NEUTROABS 12.4*  --   --   HGB 12.0 8.2* 7.1*  HCT 36.9 25.3* 22.8*  MCV 97.6 100.4* 104.6*  PLT 179 162 108*   Basic Metabolic Panel: Recent Labs  Lab 11/11/23 2038 11/12/23 1259  NA 138 139  K 3.6 3.8  CL 112* 115*  CO2 20* 17*  GLUCOSE 141* 109*  BUN 18 17  CREATININE 0.75 0.83  CALCIUM 7.6* 7.1*  MG 2.1  --   PHOS 3.1  --    GFR: Estimated Creatinine Clearance: 31.7 mL/min (by C-G formula based on SCr of 0.83 mg/dL). Recent Labs  Lab 11/11/23 2038 11/12/23 1259 11/12/23 2021  WBC 14.4* 7.8 8.8   Liver Function Tests: Recent Labs  Lab 11/11/23 2038 11/12/23 1259  AST 18 19  ALT 15 14  ALKPHOS 25* 20*  BILITOT 0.5 0.8  PROT 5.2* 4.5*  ALBUMIN 2.8* 2.4*   No results for input(s): LIPASE, AMYLASE in the last 168 hours. No results for input(s): AMMONIA in the last 168 hours.  ABG:    Component Value Date/Time   TCO2 23 10/06/2015 0754    Coagulation Profile: Recent Labs  Lab 11/11/23 2038  INR 1.0   Cardiac Enzymes: Recent Labs  Lab 11/11/23 2038  CKTOTAL 111   HbA1C: No results found for: HGBA1C  CBG: No results for input(s): GLUCAP in the last 168 hours.  Review of Systems:   Review of Systems  Constitutional:  Negative for chills, fever and weight loss.  HENT:  Negative  for congestion, nosebleeds, sinus pain and sore throat.   Eyes:  Negative for blurred vision.  Respiratory:  Negative for cough, hemoptysis, sputum production, shortness of breath, wheezing and stridor.   Cardiovascular:  Negative for chest pain, palpitations, orthopnea, claudication, leg swelling and PND.  Gastrointestinal:  Negative for abdominal pain, constipation, diarrhea, melena, nausea and vomiting.  Genitourinary:  Negative for dysuria, frequency, hematuria and urgency.  Musculoskeletal:  Negative for back pain and neck pain.  Skin:  Negative for itching and rash.      Past Medical History:  She,  has a past medical history of Arthritis, Chronic cystitis, Detrusor instability of bladder, Fecal incontinence, Glaucoma of both eyes, History of endometrial cancer, History of radiation therapy, Hyperlipidemia, Hypertension, OAB (overactive bladder), Osteopenia (05/2013), Urge urinary incontinence, and Wears glasses.  Surgical History:   Past Surgical History:  Procedure Laterality Date   ANAL RECTAL MANOMETRY N/A 03/29/2014   Procedure: ANO RECTAL MANOMETRY;  Surgeon: Joyce Nixon, MD;  Location: [facility];  Service: Endoscopy;  Laterality: N/A;   ANAL RECTAL MANOMETRY N/A 04/28/2014   Procedure: ANO RECTAL MANOMETRY;  Surgeon: Joyce Nixon, MD;  Location: [facility];  Service: Endoscopy;  Laterality: N/A;   CATARACT EXTRACTION W/ INTRAOCULAR LENS  IMPLANT, BILATERAL  1990's   GLAUCOMA SURGERY Bilateral 2010   laser   PATELLA FRACTURE SURGERY Left 1995   SACRAL NERVE LEAD IMPLANTATION -- STAGE ONE INTERSTIM  09-01-2014   dr Joyce Nixon   fecal incontinence   STAGE TWO SACRAL NERVE STIMULATOR INTERSTIM  09-15-2014   TOTAL ABDOMINAL HYSTERECTOMY W/ BILATERAL SALPINGOOPHORECTOMY  1990    Social History:   reports that she has never smoked. She has never used smokeless tobacco. She reports that she does not currently use alcohol after a past usage of about 1.0 standard drink  of alcohol per week. She reports that she does not use drugs.   Family History:  Her family history includes Cancer in her father; Colon cancer in her mother; Heart disease in her maternal grandfather and maternal grandmother.   Allergies: Allergies  Allergen Reactions   Homatropine Itching   Hydrocodone Itching   Sulfa Antibiotics Itching   Sulfamethoxazole Itching    Home Medications: Prior to Admission medications   Medication Sig Start Date End Date Taking? Authorizing Provider  amLODipine-olmesartan (AZOR) 5-40 MG tablet TAKE 1 TABLET BY MOUTH EVERY DAY 12/01/11  Yes [provider]  aspirin 81 MG EC tablet Take 81 mg by mouth.   Yes [provider]  B Complex-C (B-COMPLEX WITH VITAMIN C) tablet Take 1 tablet by mouth daily.   Yes [provider]  Calcium Citrate-Vitamin D 200-250 MG-UNIT TABS Take 1 tablet by mouth daily.   Yes [provider]  dorzolamide (TRUSOPT) 2 % ophthalmic solution Place 1 drop into both eyes 2 (two) times daily. 10/30/22  Yes [provider]  glucosamine-chondroitin 500-400 MG tablet Take 1 tablet by mouth daily.   Yes [provider]  latanoprost (XALATAN) 0.005 % ophthalmic solution INSTILL 1 DROP INTO BOTH EYES AT BEDTIME 12/06/11  Yes [provider]  MAGNESIUM PO Take 1 tablet by mouth daily.   Yes [provider]  omeprazole (PRILOSEC) 40 MG capsule Take 40 mg by mouth daily.   Yes [provider]  PROLIA 60 MG/ML SOSY injection Inject 60 mg into the skin every 6 (six) months. 05/29/22  Yes [provider]  simvastatin (ZOCOR) 10 MG tablet TAKE 1 TABLET BY MOUTH EVERY DAY NIGHTLY 04/26/15  Yes [provider]  trimethoprim (TRIMPEX) 100 MG tablet Take 100 mg by mouth every evening.   Yes [provider]  Trospium Chloride 60 MG CP24 Take 1 tablet by mouth daily.   Yes [provider]  methocarbamol  (ROBAXIN ) 500 MG tablet Take 1 tablet  (500 mg total) by mouth 2 (two) times daily as needed. Patient not taking: Reported on 11/11/2023 09/15/19   Sandie Cross, PA-C  predniSONE  (STERAPRED UNI-PAK 21 TAB) 10 MG (21) TBPK tablet Take as directed Patient not taking: Reported on 11/11/2023 09/15/19   Sandie Cross, PA-C    Critical care time:   The patient is critically ill with multiple organ system failure and requires high complexity decision making for assessment and support, frequent evaluation and titration of therapies,  advanced monitoring, review of radiographic studies and interpretation of complex data.   Critical Care Time devoted to patient care services, exclusive of separately billable procedures, described in this note is 55 minutes.  Madelynn Schilder, MD [facility] 11/12/23 11:56 PM  Please see [email or web address] for pager details.  From 7A-7P if no response, please call [phone] After hours, please call ELink [phone]

## 2023-11-12 NOTE — Anesthesia Procedure Notes (Signed)
 Procedure Name: Intubation Date/Time: 11/12/2023 4:03 PM  Performed by: Vella Gey, CRNAPatient Re-evaluated:Patient Re-evaluated prior to induction Oxygen Delivery Method: Circle system utilized Preoxygenation: Pre-oxygenation with 100% oxygen Induction Type: IV induction, Rapid sequence and Cricoid Pressure applied Laryngoscope Size: Miller and 2 Grade View: Grade I Tube type: Oral Tube size: 7.0 mm Number of attempts: 1 Airway Equipment and Method: Stylet Placement Confirmation: ETT inserted through vocal cords under direct vision and breath sounds checked- equal and bilateral Secured at: 21 cm Tube secured with: Tape Dental Injury: Teeth and Oropharynx as per pre-operative assessment

## 2023-11-12 NOTE — Progress Notes (Signed)
 Orthopedic Tech Progress Note Patient Details:  Janet Cooper 1930-02-14 [id number]  Musculoskeletal Traction Type of Traction: [place] Skin Traction Traction Location: right Traction Weight: 5 lbs   Post Interventions Patient Tolerated: Well Instructions Provided: Adjustment of device, Care of device  Leodis Rainwater 11/12/2023, 12:03 PM

## 2023-11-12 NOTE — Consult Note (Incomplete)
 NAME:  Janet Cooper, MRN:  [id number], DOB:  July 31, 1929, LOS: 1 ADMISSION DATE:  11/11/2023 CONSULTATION DATE:  11/12/2023 REFERRING MD:  Hildy Lowers - TRH, CHIEF COMPLAINT:  Fall, now with hypotension/shock   History of Present Illness:  89 year old woman who presented to [facility] 6/9 for fall. PMHx significant for HTN, HLD, chronic cystitis/frequent UTIs, OAB, endometrial CA (stage 1B s/p TAH/BSO, s/p radiation).  Per chart review, patient was walking down steps at home (2) and fell, landing on R side. No head strike/LOC. Patient noted severe RLE pain post-fall. Imaging demonstrated acute markedly superolaterally displaced R intertrochanteric femoral fracture; Orthopedics was consulted. Labs were notable for WBC 14.4, Hgb 12.0, Plt 179. INR 1.0. Na 138, K 3.6, CO2 20, BUN/Cr 18/0.75. Phos 3.1, Mg 2.1. CK 111. LFTs WNL. UA wh  Pertinent Medical History:   Past Medical History:  Diagnosis Date  . Arthritis    back  . Chronic cystitis   . Detrusor instability of bladder   . Fecal incontinence   . Glaucoma of both eyes   . History of endometrial cancer    1990 dx  Stage IB, Grade 1 endometrial carcinoma  s/p TAH w/ BSO--  recurrence 1994 vaginal cuff  s/p radiation  . History of radiation therapy    1994  lower pelvic area for recurrent endometrial cancer at vaginal cuff  . Hyperlipidemia   . Hypertension   . OAB (overactive bladder)   . Osteopenia 05/2013   T score -2.1 FRAX 8.9%/2.5%  . Urge urinary incontinence   . Wears glasses    Significant Hospital Events: Including procedures, antibiotic start and stop dates in addition to other pertinent events     Interim History / Subjective:  ***  Objective:   Blood pressure (!) 108/50, pulse 88, temperature 98.2 F (36.8 C), temperature source Oral, resp. rate 18, height 5' (1.524 m), weight 48.1 kg, SpO2 100%.        Intake/Output Summary (Last 24 hours) at 11/12/2023 2356 Last data filed at 11/12/2023 1734 Gross per 24 hour   Intake 1665 ml  Output 1285 ml  Net 380 ml   Filed Weights   11/12/23 1411  Weight: 48.1 kg   Physical Examination: General: {SRACUITY:25313} ill-appearing *** in NAD. HEENT: [place]/AT, anicteric sclera, PERRL, moist mucous membranes. Neuro: {SRLOC:25308} {SRSTIMULI:25309} {SRCOMMANDS:25310} {SRNEUROEXTREMITIES:25312} Strength ***/5 in *** extremities. {SRRBRAINSTEM:25311}  CV: RRR, no m/g/r. PULM: Breathing even and unlabored on ***. Lung fields ***. GI: Soft, nontender, nondistended. Normoactive bowel sounds. Extremities: *** LE edema noted. Skin: Warm/dry, ***.  Resolved Hospital Problem List:    Assessment & Plan:  ***  Best Practice: (right click and "Reselect all SmartList Selections" daily)   Diet/type: {diet type:25684} DVT prophylaxis: {anticoagulation (Optional):25687} GI prophylaxis: {WU:98119} Lines: {Central Venous Access:25771} Foley:  {Central Venous Access:25691} Code Status:  {Code Status:26939} Last date of multidisciplinary goals of care discussion [***]  Labs:  CBC: Recent Labs  Lab 11/11/23 2038 11/12/23 1259 11/12/23 2021  WBC 14.4* 7.8 8.8  NEUTROABS 12.4*  --   --   HGB 12.0 8.2* 7.1*  HCT 36.9 25.3* 22.8*  MCV 97.6 100.4* 104.6*  PLT 179 162 108*   Basic Metabolic Panel: Recent Labs  Lab 11/11/23 2038 11/12/23 1259  NA 138 139  K 3.6 3.8  CL 112* 115*  CO2 20* 17*  GLUCOSE 141* 109*  BUN 18 17  CREATININE 0.75 0.83  CALCIUM 7.6* 7.1*  MG 2.1  --   PHOS 3.1  --  GFR: Estimated Creatinine Clearance: 31.7 mL/min (by C-G formula based on SCr of 0.83 mg/dL). Recent Labs  Lab 11/11/23 2038 11/12/23 1259 11/12/23 2021  WBC 14.4* 7.8 8.8   Liver Function Tests: Recent Labs  Lab 11/11/23 2038 11/12/23 1259  AST 18 19  ALT 15 14  ALKPHOS 25* 20*  BILITOT 0.5 0.8  PROT 5.2* 4.5*  ALBUMIN 2.8* 2.4*   No results for input(s): "LIPASE", "AMYLASE" in the last 168 hours. No results for input(s): "AMMONIA" in the last 168  hours.  ABG:    Component Value Date/Time   TCO2 23 10/06/2015 0754    Coagulation Profile: Recent Labs  Lab 11/11/23 2038  INR 1.0   Cardiac Enzymes: Recent Labs  Lab 11/11/23 2038  CKTOTAL 111   HbA1C: No results found for: "HGBA1C"  CBG: No results for input(s): "GLUCAP" in the last 168 hours.  Review of Systems:   ***  Past Medical History:  She,  has a past medical history of Arthritis, Chronic cystitis, Detrusor instability of bladder, Fecal incontinence, Glaucoma of both eyes, History of endometrial cancer, History of radiation therapy, Hyperlipidemia, Hypertension, OAB (overactive bladder), Osteopenia (05/2013), Urge urinary incontinence, and Wears glasses.   Surgical History:   Past Surgical History:  Procedure Laterality Date  . ANAL RECTAL MANOMETRY N/A 03/29/2014   Procedure: ANO RECTAL MANOMETRY;  Surgeon: Joyce Nixon, MD;  Location: [facility];  Service: Endoscopy;  Laterality: N/A;  . ANAL RECTAL MANOMETRY N/A 04/28/2014   Procedure: ANO RECTAL MANOMETRY;  Surgeon: Joyce Nixon, MD;  Location: [facility];  Service: Endoscopy;  Laterality: N/A;  . CATARACT EXTRACTION W/ INTRAOCULAR LENS  IMPLANT, BILATERAL  1990's  . GLAUCOMA SURGERY Bilateral 2010   laser  . PATELLA FRACTURE SURGERY Left 1995  . SACRAL NERVE LEAD IMPLANTATION -- STAGE ONE INTERSTIM  09-01-2014   dr Joyce Nixon   fecal incontinence  . STAGE TWO SACRAL NERVE STIMULATOR INTERSTIM  09-15-2014  . TOTAL ABDOMINAL HYSTERECTOMY W/ BILATERAL SALPINGOOPHORECTOMY  1990    Social History:   reports that she has never smoked. She has never used smokeless tobacco. She reports that she does not currently use alcohol after a past usage of about 1.0 standard drink of alcohol per week. She reports that she does not use drugs.   Family History:  Her family history includes Cancer in her father; Colon cancer in her mother; Heart disease in her maternal grandfather and maternal grandmother.    Allergies: Allergies  Allergen Reactions  . Homatropine Itching  . Hydrocodone Itching  . Sulfa Antibiotics Itching  . Sulfamethoxazole Itching    Home Medications: Prior to Admission medications   Medication Sig Start Date End Date Taking? Authorizing Provider  amLODipine-olmesartan (AZOR) 5-40 MG tablet TAKE 1 TABLET BY MOUTH EVERY DAY 12/01/11  Yes [provider]  aspirin 81 MG EC tablet Take 81 mg by mouth.   Yes [provider]  B Complex-C (B-COMPLEX WITH VITAMIN C) tablet Take 1 tablet by mouth daily.   Yes [provider]  Calcium Citrate-Vitamin D 200-250 MG-UNIT TABS Take 1 tablet by mouth daily.   Yes [provider]  dorzolamide (TRUSOPT) 2 % ophthalmic solution Place 1 drop into both eyes 2 (two) times daily. 10/30/22  Yes [provider]  glucosamine-chondroitin 500-400 MG tablet Take 1 tablet by mouth daily.   Yes [provider]  latanoprost (XALATAN) 0.005 % ophthalmic solution INSTILL 1 DROP INTO BOTH EYES AT BEDTIME 12/06/11  Yes [provider]  MAGNESIUM PO Take 1 tablet by mouth daily.   Yes [provider]  omeprazole (PRILOSEC) 40 MG capsule Take 40 mg by mouth daily.   Yes [provider]  PROLIA 60 MG/ML SOSY injection Inject 60 mg into the skin every 6 (six) months. 05/29/22  Yes [provider]  simvastatin (ZOCOR) 10 MG tablet TAKE 1 TABLET BY MOUTH EVERY DAY NIGHTLY 04/26/15  Yes [provider]  trimethoprim (TRIMPEX) 100 MG tablet Take 100 mg by mouth every evening.   Yes [provider]  Trospium Chloride 60 MG CP24 Take 1 tablet by mouth daily.   Yes [provider]  methocarbamol  (ROBAXIN ) 500 MG tablet Take 1 tablet (500 mg total) by mouth 2 (two) times daily as needed. Patient not taking: Reported on 11/11/2023 09/15/19   Sandie Cross, PA-C  predniSONE  (STERAPRED UNI-PAK 21 TAB) 10 MG (21) TBPK tablet Take as directed Patient not  taking: Reported on 11/11/2023 09/15/19   Sandie Cross, PA-C    Critical care time:   The patient is critically ill with multiple organ system failure and requires high complexity decision making for assessment and support, frequent evaluation and titration of therapies, advanced monitoring, review of radiographic studies and interpretation of complex data.   Critical Care Time devoted to patient care services, exclusive of separately billable procedures, described in this note is *** minutes.  [place], PA-C [facility] 11/12/23 11:56 PM  Please see [email or web address] for pager details.  From 7A-7P if no response, please call [phone] After hours, please call ELink [phone]

## 2023-11-12 NOTE — Consult Note (Signed)
 Reason for Consult: right hip fracture Referring Physician: Hildy Lowers, MD  Janet Cooper is an 89 y.o. female.  HPI:  Patient is a 89 year old female with a history of hypertension, hyperlipidemia, fecal and urinary incontinence who is presenting today after a fall.  She reports she was coming down 2 steps and she fell landing on her right side.  She was having severe pain in her right leg and was unable to get up.  She denies hitting her head or loss of consciousness.  She reports she always leaks a lot of stool but today has been a little bit more.  She denies any abdominal pain, shortness of breath, chest pain.  No headaches.  She is feeling some nausea after getting the pain medication.  She denies any numbness or tingling in her foot.  She reports as long as she does not move her right leg it does not hurt too bad.  She has never had surgery on this leg in the past.   Has some right arm pain, but not other UE complaints or LLE complaints.  She states that she lives alone.  She has no family in the [place] .  Her neighbor helps her with things when needed.  Past Medical History:  Diagnosis Date   Arthritis    back   Chronic cystitis    Detrusor instability of bladder    Fecal incontinence    Glaucoma of both eyes    History of endometrial cancer    1990 dx  Stage IB, Grade 1 endometrial carcinoma  s/p TAH w/ BSO--  recurrence 1994 vaginal cuff  s/p radiation   History of radiation therapy    1994  lower pelvic area for recurrent endometrial cancer at vaginal cuff   Hyperlipidemia    Hypertension    OAB (overactive bladder)    Osteopenia 05/2013   T score -2.1 FRAX 8.9%/2.5%   Urge urinary incontinence    Wears glasses     Past Surgical History:  Procedure Laterality Date   ANAL RECTAL MANOMETRY N/A 03/29/2014   Procedure: ANO RECTAL MANOMETRY;  Surgeon: Joyce Nixon, MD;  Location: [facility];  Service: Endoscopy;  Laterality: N/A;   ANAL RECTAL MANOMETRY N/A  04/28/2014   Procedure: ANO RECTAL MANOMETRY;  Surgeon: Joyce Nixon, MD;  Location: [facility];  Service: Endoscopy;  Laterality: N/A;   CATARACT EXTRACTION W/ INTRAOCULAR LENS  IMPLANT, BILATERAL  1990's   GLAUCOMA SURGERY Bilateral 2010   laser   PATELLA FRACTURE SURGERY Left 1995   SACRAL NERVE LEAD IMPLANTATION -- STAGE ONE INTERSTIM  09-01-2014   dr Joyce Nixon   fecal incontinence   STAGE TWO SACRAL NERVE STIMULATOR INTERSTIM  09-15-2014   TOTAL ABDOMINAL HYSTERECTOMY W/ BILATERAL SALPINGOOPHORECTOMY  1990    Family History  Problem Relation Age of Onset   Colon cancer Mother    Cancer Father        Liver cancer   Heart disease Maternal Grandmother    Heart disease Maternal Grandfather     Social History:  reports that she has never smoked. She has never used smokeless tobacco. She reports that she does not currently use alcohol after a past usage of about 1.0 standard drink of alcohol per week. She reports that she does not use drugs.  Allergies:  Allergies  Allergen Reactions   Homatropine Itching   Hydrocodone Itching   Sulfa Antibiotics Itching   Sulfamethoxazole Itching    Medications: I have reviewed the patient's current medications.  Scheduled:  Chlorhexidine Gluconate Cloth  6 each Topical Daily   dorzolamide-timolol  1 drop Both Eyes BID   latanoprost  1 drop Both Eyes QHS   pantoprazole  40 mg Oral Daily   [START ON 11/13/2023] simvastatin  10 mg Oral q1800   trimethoprim  100 mg Oral QPM    Results for orders placed or performed during the hospital encounter of 11/11/23 (from the past 24 hours)  Basic metabolic panel     Status: Abnormal   Collection Time: 11/11/23  8:38 PM  Result Value Ref Range   Sodium 138 135 - 145 mmol/L   Potassium 3.6 3.5 - 5.1 mmol/L   Chloride 112 (H) 98 - 111 mmol/L   CO2 20 (L) 22 - 32 mmol/L   Glucose, Bld 141 (H) 70 - 99 mg/dL   BUN 18 8 - 23 mg/dL   Creatinine, Ser 2.72 0.44 - 1.00 mg/dL   Calcium 7.6 (L) 8.9 -  10.3 mg/dL   GFR, Estimated >53 >66 mL/min   Anion gap 6 5 - 15  CBC with Differential     Status: Abnormal   Collection Time: 11/11/23  8:38 PM  Result Value Ref Range   WBC 14.4 (H) 4.0 - 10.5 K/uL   RBC 3.78 (L) 3.87 - 5.11 MIL/uL   Hemoglobin 12.0 12.0 - 15.0 g/dL   HCT 44.0 34.7 - 42.5 %   MCV 97.6 80.0 - 100.0 fL   MCH 31.7 26.0 - 34.0 pg   MCHC 32.5 30.0 - 36.0 g/dL   RDW 95.6 38.7 - 56.4 %   Platelets 179 150 - 400 K/uL   nRBC 0.0 0.0 - 0.2 %   Neutrophils Relative % 86 %   Neutro Abs 12.4 (H) 1.7 - 7.7 K/uL   Lymphocytes Relative 6 %   Lymphs Abs 0.9 0.7 - 4.0 K/uL   Monocytes Relative 6 %   Monocytes Absolute 0.8 0.1 - 1.0 K/uL   Eosinophils Relative 1 %   Eosinophils Absolute 0.1 0.0 - 0.5 K/uL   Basophils Relative 0 %   Basophils Absolute 0.1 0.0 - 0.1 K/uL   Immature Granulocytes 1 %   Abs Immature Granulocytes 0.10 (H) 0.00 - 0.07 K/uL  Protime-INR     Status: None   Collection Time: 11/11/23  8:38 PM  Result Value Ref Range   Prothrombin Time 13.6 11.4 - 15.2 seconds   INR 1.0 0.8 - 1.2  Type and screen [facility]     Status: None   Collection Time: 11/11/23  8:38 PM  Result Value Ref Range   ABO/RH(D) O POS    Antibody Screen NEG    Sample Expiration      11/14/2023,2359 Performed at [facility], [address], [place], [place] [address]   CK     Status: None   Collection Time: 11/11/23  8:38 PM  Result Value Ref Range   Total CK 111 38 - 234 U/L  Magnesium     Status: None   Collection Time: 11/11/23  8:38 PM  Result Value Ref Range   Magnesium 2.1 1.7 - 2.4 mg/dL  Phosphorus     Status: None   Collection Time: 11/11/23  8:38 PM  Result Value Ref Range   Phosphorus 3.1 2.5 - 4.6 mg/dL  Hepatic function panel     Status: Abnormal   Collection Time: 11/11/23  8:38 PM  Result Value Ref Range   Total Protein 5.2 (L)  6.5 - 8.1 g/dL   Albumin 2.8 (L) 3.5 - 5.0 g/dL   AST 18 15 - 41 U/L   ALT 15 0 - 44 U/L    Alkaline Phosphatase 25 (L) 38 - 126 U/L   Total Bilirubin 0.5 0.0 - 1.2 mg/dL   Bilirubin, Direct <1.6 0.0 - 0.2 mg/dL   Indirect Bilirubin NOT CALCULATED 0.3 - 0.9 mg/dL     X-ray: CLINICAL DATA:  med clearance Pt arriving via [facility] for fall from second step. Right hip deformity,   EXAM: DG HIP (WITH OR WITHOUT PELVIS) 2-3V RIGHT   COMPARISON:  X-ray pelvis 03/13/2010   FINDINGS: Acute markedly superiorly and laterally displaced right intertrochanteric femoral fracture. No right hip dislocation. No acute displaced fracture or dislocation of the left hip. No acute displaced fracture or dislocation of the left hip on frontal view. No acute displaced fracture or diastasis of the bones of the pelvis. There is no evidence of arthropathy or other focal bone abnormality.   Degenerative changes of the lower lumbar spine.   IMPRESSION: Acute markedly superolaterally displaced right intertrochanteric femoral fracture.     Electronically Signed   By: Morgane  Naveau M.D.  ROS  Blood pressure (!) 92/52, pulse 86, temperature 98.8 F (37.1 C), temperature source Oral, resp. rate 18, SpO2 99%.  Physical Exam: Constitutional:      General: She is not in acute distress.    Appearance: She is well-developed.  HENT:     Head: Normocephalic and atraumatic.  Eyes:     Pupils: Pupils are equal, round, and reactive to light.  Cardiovascular:     Rate and Rhythm: Regular rhythm. Tachycardia present.     Pulses: Normal pulses.     Heart sounds: Normal heart sounds. No murmur heard.    No friction rub.  Pulmonary:     Effort: Pulmonary effort is normal.     Breath sounds: Normal breath sounds. No wheezing or rales.  Abdominal:     General: Bowel sounds are normal. There is no distension.     Palpations: Abdomen is soft.     Tenderness: There is no abdominal tenderness. There is no guarding or rebound.  Musculoskeletal:        General: Tenderness and deformity present.      Right hip: Deformity and tenderness present. Shortening with external rotation     Comments: No edema  Skin:    General: Skin is warm and dry.     Findings: No rash.  Neurological:     Mental Status: She is alert and oriented to person, place, and time.     Cranial Nerves: No cranial nerve deficit.     Comments: Normal movement and sensation in the right foot  Psychiatric:        Behavior: Behavior normal.   Assessment/Plan: Right hip fracture that seems to be on the border of a basicervical versus intertrochanteric femur fracture with displacement of the lesser trochanter  Plan: She has sustained a significant right hip fracture with displacement.  Operative repair is indicated.  Based on the appearance of her radiographs and the stability of her greater trochanter as well as her current independent living arrangement without family support I am going to proceed with a right total hip replacement to allow for early functional return with less risks of nonunion malunion and need for future surgeries.  Standard risks of infection DVT dislocation neurovascular injury would be consistent with that for hip replacement.  The postoperative course  will include postoperative physical therapy and short-term nursing facility with hopeful return to independent living.  There will be challenges with her overall recovery of normal activities of daily living particularly given the fact that she does not have social support. Orders placed for surgery today. N.p.o.  Janet Cooper 11/12/2023, 12:26 PM

## 2023-11-12 NOTE — Progress Notes (Signed)
 PROGRESS NOTE    Janet Cooper  ZOX:[id number] DOB: [birth year] DOA: 11/11/2023 PCP: Lory Rough., PA-C    Chief Complaint  Patient presents with   Fall   Hip Injury    Right    Brief Narrative:  Patient is a 89 year old female history of hypertension, hyperlipidemia presenting to the ED while was walking down 2 steps into her garage and landed on her right side with significant right hip pain.  Imaging done consistent with right hip fracture.   Assessment & Plan:   Principal Problem:   Closed right hip fracture (HCC) Active Problems:   Hyperlipidemia   Hypertension   S/P total right hip arthroplasty   Anemia   Hypotension  #1 right intertrochanteric femoral fracture -Secondary to mechanical fall. - Patient noted to be hypotensive and have low blood pressure which she states is at baseline and on antihypertensive medications. - Patient states over the past few days has been having feelings of motion sickness and just not feeling too well with some nausea. - Patient seen in consultation by orthopedics and patient for surgical repair today.  2.  Hypotension -Patient noted with systolic blood pressures in the 70s to 80s on presentation to the ED however patient alert oriented and answering questions appropriately. - Patient states on antihypertensive medications which she was told to continue per PCP despite low/soft blood pressure. - Will give a fluid bolus. - IV albumin x 1. - IV fluids. - Discontinue antihypertensive medications and likely will not resume on discharge. - Check a UA with cultures and sensitivities. - Check blood cultures x 2. - Patient noted with an anemia will transfuse 1 unit PRBCs.  3.  Hypertension -Hold antihypertensive medications secondary to problem #2. - Patient noted to have low blood pressure on presentation, states BP has been low/soft with systolics in the 100s however was told to continue her antihypertensive medications per her  PCP. - Will discontinue antihypertensive medications and not resume on discharge.  4.  Anemia -Patient noted to have a drop in hemoglobin of 8.2 from 12.0 on admission. - Patient with no overt bleeding. - Check an anemia panel. - Check FOBT. - Place on IV PPI. - Transfuse 1 unit of PRBCs due to presentation with hypotension. - Follow H&H.  5.  Hyperlipidemia -Continue Zocor.   DVT prophylaxis: Postop DVT prophylaxis per Orthopedics. Code Status: DNR Family Communication: Updated patient.  No family at bedside. Disposition: TBD  Status is: Inpatient Remains inpatient appropriate because: Severity of illness   Consultants:  Orthopedics: Dr. Bernard Brick 11/12/2023  Procedures:  Chest x-ray 11/11/2023 Plain films of the right hip and pelvis 11/11/2023   Antimicrobials:  Anti-infectives (From admission, onward)    Start     Dose/Rate Route Frequency Ordered Stop   11/12/23 2200  ceFAZolin  (ANCEF ) IVPB 2g/100 mL premix        2 g 200 mL/hr over 30 Minutes Intravenous Every 6 hours 11/12/23 1735 11/13/23 0959   11/12/23 1800  [MAR Hold]  trimethoprim (TRIMPEX) tablet 100 mg        (MAR Hold since Tue 11/12/2023 at 1554.Hold Reason: Transfer to a Procedural area)   100 mg Oral Every evening 11/12/23 0923     11/12/23 1400  ceFAZolin  (ANCEF ) IVPB 2g/100 mL premix        2 g 200 mL/hr over 30 Minutes Intravenous On call to O.R. 11/12/23 1357 11/12/23 1614         Subjective: Patient in short stay just  finished receiving nerve block.  Feels some improvement with right hip pain and just feels a pressure with palpation.  Denies any chest pain or shortness of breath.  No abdominal pain.  Complaining of some nausea.  States over the past several days has just not been feeling well which she describes as similar to motion sickness.  Objective: Vitals:   11/12/23 2000 11/12/23 2005 11/12/23 2010 11/12/23 2015  BP: (!) 87/55   (!) 82/50  Pulse: 94 97 93   Resp: 14 12 11 14   Temp:       TempSrc:      SpO2: 100% 100% 100%   Weight:      Height:        Intake/Output Summary (Last 24 hours) at 11/12/2023 2019 Last data filed at 11/12/2023 1734 Gross per 24 hour  Intake 1665 ml  Output 1285 ml  Net 380 ml   Filed Weights   11/12/23 1411  Weight: 48.1 kg    Examination:  General exam: Appears calm and comfortable  Respiratory system: Clear to auscultation anterior lung fields. Respiratory effort normal. Cardiovascular system: S1 & S2 heard, RRR. No JVD, murmurs, rubs, gallops or clicks. No pedal edema. Gastrointestinal system: Abdomen is nondistended, soft and nontender. No organomegaly or masses felt. Normal bowel sounds heard. Central nervous system: Alert and oriented. No focal neurological deficits. Extremities: Right lower extremity shortened and externally rotated.  Feels pressure to palpation.  Skin: No rashes, lesions or ulcers Psychiatry: Judgement and insight appear normal. Mood & affect appropriate.     Data Reviewed: I have personally reviewed following labs and imaging studies  CBC: Recent Labs  Lab 11/11/23 2038 11/12/23 1259  WBC 14.4* 7.8  NEUTROABS 12.4*  --   HGB 12.0 8.2*  HCT 36.9 25.3*  MCV 97.6 100.4*  PLT 179 162    Basic Metabolic Panel: Recent Labs  Lab 11/11/23 2038 11/12/23 1259  NA 138 139  K 3.6 3.8  CL 112* 115*  CO2 20* 17*  GLUCOSE 141* 109*  BUN 18 17  CREATININE 0.75 0.83  CALCIUM 7.6* 7.1*  MG 2.1  --   PHOS 3.1  --     GFR: Estimated Creatinine Clearance: 31.7 mL/min (by C-G formula based on SCr of 0.83 mg/dL).  Liver Function Tests: Recent Labs  Lab 11/11/23 2038 11/12/23 1259  AST 18 19  ALT 15 14  ALKPHOS 25* 20*  BILITOT 0.5 0.8  PROT 5.2* 4.5*  ALBUMIN 2.8* 2.4*    CBG: No results for input(s): "GLUCAP" in the last 168 hours.   No results found for this or any previous visit (from the past 240 hours).       Radiology Studies: DG Pelvis Portable Result Date:  11/12/2023 CLINICAL DATA:  Status post hip arthroplasty. EXAM: PORTABLE PELVIS 1-2 VIEWS COMPARISON:  Preoperative radiograph FINDINGS: Right hip arthroplasty in expected alignment. Fracture involving the intertrochanteric femur with displacement of the lesser trochanter, seen on preoperative exam. Recent postsurgical change includes air and edema in the soft tissues. IMPRESSION: Right hip arthroplasty in expected alignment. Electronically Signed   By: Chadwick Colonel M.D.   On: 11/12/2023 18:26   DG HIP UNILAT WITH PELVIS 1V RIGHT Result Date: 11/12/2023 CLINICAL DATA:  Right total hip arthroplasty EXAM: DG HIP (WITH OR WITHOUT PELVIS) 2V RIGHT COMPARISON:  11/11/2023 FINDINGS: A total of four images were obtained depicting the right hip and pelvis. The first image is a fluoroscopic spot image demonstrating a displaced intertrochanteric fracture  of the right hip. There is likely some osteoid along the margins. Lesser trochanteric fragment noted. The next images a frontal view of the lower pelvis. No additional fractures are seen. The third image depicts a right total hip prosthesis in place although the final head of the right femoral component is not currently visible. There could be a radiolucent sizing head in place. The neck of the femoral prosthesis appears satisfactory aligned with the acetabular shell. The final image is centered on the lower pelvis and likewise shows the components of the hip prosthesis in place. IMPRESSION: 1. Right total hip prosthesis in place, although the final head of the right femoral component is not currently visible on the final images. There could be a radiolucent sizing head in place. 2. Displaced intertrochanteric fracture of the right hip. Electronically Signed   By: Freida Jes M.D.   On: 11/12/2023 17:23   DG C-Arm 1-60 Min-No Report Result Date: 11/12/2023 Fluoroscopy was utilized by the requesting physician.  No radiographic interpretation.   DG Chest Port  1 View Result Date: 11/11/2023 CLINICAL DATA:  med clearance.  Right hip deformity/fracture EXAM: PORTABLE CHEST 1 VIEW COMPARISON:  None Available. FINDINGS: The heart and mediastinal contours are within normal limits. Atherosclerotic plaque. Left base atelectasis. No focal consolidation. No pulmonary edema. No pleural effusion. No pneumothorax. No acute osseous abnormality. IMPRESSION: 1. No active disease. 2.  Aortic Atherosclerosis (ICD10-I70.0). Electronically Signed   By: Morgane  Naveau M.D.   On: 11/11/2023 21:39   DG Hip Unilat With Pelvis 2-3 Views Right Result Date: 11/11/2023 CLINICAL DATA:  med clearance Pt arriving via [facility] for fall from second step. Right hip deformity, EXAM: DG HIP (WITH OR WITHOUT PELVIS) 2-3V RIGHT COMPARISON:  X-ray pelvis 03/13/2010 FINDINGS: Acute markedly superiorly and laterally displaced right intertrochanteric femoral fracture. No right hip dislocation. No acute displaced fracture or dislocation of the left hip. No acute displaced fracture or dislocation of the left hip on frontal view. No acute displaced fracture or diastasis of the bones of the pelvis. There is no evidence of arthropathy or other focal bone abnormality. Degenerative changes of the lower lumbar spine. IMPRESSION: Acute markedly superolaterally displaced right intertrochanteric femoral fracture. Electronically Signed   By: Morgane  Naveau M.D.   On: 11/11/2023 21:37        Scheduled Meds:  acetaminophen   1,000 mg Oral Q6H   [MAR Hold] Chlorhexidine Gluconate Cloth  6 each Topical Daily   Chlorhexidine Gluconate Cloth   Topical Once   [MAR Hold] dorzolamide-timolol  1 drop Both Eyes BID   [MAR Hold] latanoprost  1 drop Both Eyes QHS   [MAR Hold] pantoprazole  40 mg Oral Daily   [MAR Hold] simvastatin  10 mg Oral q1800   [MAR Hold] trimethoprim  100 mg Oral QPM   Continuous Infusions:  sodium chloride  Stopped (11/12/23 0925)   sodium chloride  Stopped (11/12/23 1658)   sodium chloride       sodium chloride       ceFAZolin  (ANCEF ) IV     tranexamic acid       LOS: 1 day    Time spent: 40 minutes    Hilda Lovings, MD [facility]   To contact the attending provider between 7A-7P or the covering provider during after hours 7P-7A, please log into the web site [email or web address] and access using universal [place] password for that web site. If you do not have the password, please call the hospital operator.  11/12/2023, 8:19 PM

## 2023-11-12 NOTE — Progress Notes (Signed)
 Patient ID: Janet Cooper, female   DOB: April 22, 1930, 89 y.o.   MRN: [id number]  Consult received regarding her right hip fracture.  She will need operative repair.  Full note to follow. I will need to try to find the OR time either today or tomorrow to have this addressed either through myself or a colleague.

## 2023-11-12 NOTE — TOC Initial Note (Signed)
 Transition of Care [facility]) - Initial/Assessment Note    Patient Details  Name: Desmond T Eyerman MRN: [id number] Date of Birth: 1929-07-01  [facility]) CM/SW Contact:    Ruben Corolla, RN Phone Number: 11/12/2023, 2:22 PM  Clinical Narrative: Patient in surgery for ORIF R hip await to discuss d/c plans.                  Expected Discharge Plan: Home w Home Health Services Barriers to Discharge: Continued Medical Work up   Patient Goals and CMS Choice Patient states their goals for this hospitalization and ongoing recovery are:: Home          Expected Discharge Plan and Services                                              Prior Living Arrangements/Services                       Activities of Daily Living   ADL Screening (condition at time of admission) Independently performs ADLs?: Yes (appropriate for developmental age) Is the patient deaf or have difficulty hearing?: No Does the patient have difficulty seeing, even when wearing glasses/contacts?: No Does the patient have difficulty concentrating, remembering, or making decisions?: No  Permission Sought/Granted                  Emotional Assessment              Admission diagnosis:  Closed right hip fracture (HCC) [S72.001A] Fall, initial encounter [W19.XXXA] Intertrochanteric fracture of right hip, closed, initial encounter [facility]) [S72.141A] Patient Active Problem List   Diagnosis Date Noted   Closed right hip fracture (HCC) 11/11/2023   Dizziness 04/13/2023   Sensorineural hearing loss, bilateral 04/13/2023   Acquired scoliosis 08/10/2020   Arthropathy of lumbar facet joint 08/10/2020   Degenerative scoliosis in adult patient 05/20/2020   Chronic low back pain 05/20/2020   Other chronic pain 05/20/2020   Onychomycosis with ingrown toenail 03/22/2020   Onychomycosis of toenail 06/11/2019   BPV (benign positional vertigo), left 02/10/2019   Nausea 02/10/2019    Reactive depression 03/24/2018   IFG (impaired fasting glucose) 10/23/2016   Hyperlipidemia 02/28/2016   Hypertension 02/28/2016   Rectocele 09/16/2013   Neck pain 02/13/2013   Fecal urgency 12/14/2011   Full incontinence of feces 12/14/2011   Mixed stress and urge urinary incontinence 12/14/2011   PCP:  Lory Rough., PA-C Pharmacy:   [facility] [phone] - [place], [place] - [address] [place] [address] Phone: [phone] Fax: [phone]     Social Drivers of Health (SDOH) Social History: SDOH Screenings   Tobacco Use: Low Risk  (11/12/2023)   SDOH Interventions:     Readmission Risk Interventions     No data to display

## 2023-11-12 NOTE — Op Note (Signed)
 NAME:  Janet Cooper                ACCOUNT NO.: [id number]      MEDICAL RECORD NO.: [id number]      FACILITY:  [facility]      PHYSICIAN:  Bevin Bucks  DATE OF BIRTH:  November 11, 1929     DATE OF PROCEDURE:  11/12/2023                                 OPERATIVE REPORT         PREOPERATIVE DIAGNOSIS: Right hip fracture     POSTOPERATIVE DIAGNOSIS:  Right hip fracture   PROCEDURE:  Right total hip replacement through an anterior approach   utilizing DePuy THR system, component size 50 mm pinnacle cup, a size 32+4 neutral   Altrex liner, a size 15 standard Reclaim stem with a 32+5 Articuleze metal head ball.      SURGEON:  Azalea Lento. Bernard Brick, M.D.      ASSISTANT:  Kim Pen, PA-C     ANESTHESIA:  General.      SPECIMENS:  None.      COMPLICATIONS:  None.      BLOOD LOSS:  500 cc     DRAINS:  None.      INDICATION OF THE PROCEDURE:  Janet Cooper is a 89 y.o. female who lives independently here in [place] without family.  She unfortunately had a fall down a couple steps landing on her right side.  She had immediate onset of pain with inability to bear weight.  She is brought to the emergency room by EMS.  Radiographs revealed a basicervical/intertrochanteric femur fracture.  Based on the nature of her fracture as well as her need for independence I felt that it was in her best interest to proceed with a total hip replacement as opposed to ORIF or intramedullary nailing.  We reviewed the standard risks of infection DVT dislocation neurovascular injury and the need for future surgeries.  We discussed reviewed the postoperative course and expectations.  Consent was obtained for management of her hip fracture and pain.     PROCEDURE IN DETAIL:  The patient was brought to operative theater.   Once adequate anesthesia, preoperative antibiotics, 2 gm of Ancef , 1 gm of Tranexamic Acid, and 10 mg of Decadron  were administered, the patient was positioned supine  on the [employer] table.  Once the patient was safely positioned with adequate padding of boney prominences we predraped out the hip, and used fluoroscopy to confirm orientation of the pelvis.      The right hip was then prepped and draped from proximal iliac crest to   mid thigh with a shower curtain technique.      Time-out was performed identifying the patient, planned procedure, and the appropriate extremity.     An incision was then made 2 cm lateral to the   anterior superior iliac spine extending over the orientation of the   tensor fascia lata muscle and sharp dissection was carried down to the   fascia of the muscle.      The fascia was then incised.  The muscle belly was identified and swept   laterally and retractor placed along the superior neck.  Following   cauterization of the circumflex vessels and removing some pericapsular   fat, a second cobra retractor was placed on the inferior neck.  A T-capsulotomy was made along the line of the   superior neck to the trochanteric fossa, then extended proximally and   distally.  Tag sutures were placed and the retractors were then placed   intracapsular.  We then identified the fracture site and pattern.  There was comminution involving this fracture site however her greater trochanter was intact as anticipated by plain radiographs.  Fragments were removed.  Identified the femoral head and neck segment and remove this using a corkscrew.  Further fragments were removed as identified and necessary.  Traction was let   off and retractors were placed posterior and anterior around the   acetabulum.      The labrum and foveal tissue were debrided.  I began reaming with a 45 mm   reamer and reamed up to 49 mm reamer with good bony bed preparation and a 50 mm  cup was chosen.  The final 50 mm Pinnacle cup was then impacted under fluoroscopy to confirm the depth of penetration and orientation with respect to   Abduction and forward flexion.  A  screw was placed into the ilium followed by the hole eliminator.  The final   32+4 neutral Altrex liner was impacted with good visualized rim fit.  The cup was positioned anatomically within the acetabular portion of the pelvis.      At this point, the femur was rolled to 100 degrees.  Further capsule was   released off the inferior aspect of the femoral neck.  I then   released the superior capsule proximally.  With the leg in a neutral position the hook was placed laterally   along the femur under the vastus lateralis origin and elevated manually and then held in position using the hook attachment on the bed.  The leg was then extended and adducted with the leg rolled to 100   degrees of external rotation.  Retractors were placed along the medial proximal femur as there was no remaining calcar or lesser trochanter and posteriorly over the greater trochanter.  Once the proximal femur was fully   exposed, we began the preparation of the femur for a reclaim stem.  We reamed sequentially up to 15 mm with good cortical contact.  This was reamed to the appropriate depth.  We then did a trial reduction with a standard neck off of this.  Based on this I felt that we would use a +5 ball.  Radiographically it was difficult to determine the leg lengths to assess stability clinically.  Given this trial reduction we are dislocated the hip removed all the trial components.  Final components were opened on the back table.  The final size 15 standard Reclaim stem was   chosen and it was impacted down to the level of of preparation based off the tip of the greater trochanter.  Based on this   and the trial reductions, a final 32+5 Articuleze metal head ball was chosen and   impacted onto a clean and dry trunnion, and the hip was reduced.  The   hip had been irrigated throughout the case again at this point.  I did   reapproximate the superior capsular leaflet to the anterior leaflet   using #1 Vicryl.  The fascia of  the   tensor fascia lata muscle was then reapproximated using #1 Vicryl and #0 Stratafix sutures.  The   remaining wound was closed with 2-0 Vicryl and running 4-0 Monocryl.   The hip was cleaned, dried, and dressed  sterilely using Dermabond and   Aquacel dressing.  The patient was then brought   to recovery room in stable condition tolerating the procedure well.   Kim Pen, PA-C was present for the entirety of the case involved from   preoperative positioning, perioperative retractor management, general   facilitation of the case, as well as primary wound closure as assistant.            Azalea Lento Bernard Brick, M.D.        11/12/2023 4:09 PM

## 2023-11-12 NOTE — Interval H&P Note (Signed)
 History and Physical Interval Note:  11/12/2023 2:44 PM  Janet Cooper  has presented today for surgery, with the diagnosis of RIGHT HIP FRACTURE.  The various methods of treatment have been discussed with the patient and family. After consideration of risks, benefits and other options for treatment, the patient has consented to  Procedure(s): ARTHROPLASTY, HIP, TOTAL, ANTERIOR APPROACH (Right) as a surgical intervention.  The patient's history has been reviewed, patient examined, no change in status, stable for surgery.  I have reviewed the patient's chart and labs.  Questions were answered to the patient's satisfaction.     Bevin Bucks

## 2023-11-12 NOTE — Anesthesia Preprocedure Evaluation (Addendum)
 Anesthesia Evaluation  Patient identified by MRN, date of birth, ID band Patient awake    Reviewed: Allergy & Precautions, NPO status , Patient's Chart, lab work & pertinent test results  History of Anesthesia Complications Negative for: history of anesthetic complications  Airway Mallampati: II  TM Distance: >3 FB Neck ROM: Full    Dental  (+) Dental Advisory Given   Pulmonary neg pulmonary ROS   Pulmonary exam normal        Cardiovascular hypertension, Pt. on medications Normal cardiovascular exam     Neuro/Psych  PSYCHIATRIC DISORDERS  Depression     BPPV Hearing loss     GI/Hepatic Neg liver ROS,,, Fecal incontinence    Endo/Other  negative endocrine ROS    Renal/GU negative Renal ROS Bladder dysfunction Female GU complaint     Musculoskeletal  (+) Arthritis ,    Abdominal   Peds  Hematology negative hematology ROS (+)   Anesthesia Other Findings Chronic back pain   Reproductive/Obstetrics                             Anesthesia Physical Anesthesia Plan  ASA: 2  Anesthesia Plan: General   Post-op Pain Management: Tylenol  PO (pre-op)* and Regional block*   Induction: Intravenous  PONV Risk Score and Plan: 3 and Treatment may vary due to age or medical condition, Ondansetron  and TIVA  Airway Management Planned: Oral ETT  Additional Equipment: None  Intra-op Plan:   Post-operative Plan: Extubation in OR  Informed Consent: I have reviewed the patients History and Physical, chart, labs and discussed the procedure including the risks, benefits and alternatives for the proposed anesthesia with the patient or authorized representative who has indicated his/her understanding and acceptance.   Patient has DNR.  Discussed DNR with patient and Suspend DNR.   Dental advisory given  Plan Discussed with: CRNA and Anesthesiologist  Anesthesia Plan Comments:          Anesthesia Quick Evaluation

## 2023-11-12 NOTE — Transfer of Care (Signed)
 Immediate Anesthesia Transfer of Care Note  Patient: Janet Cooper  Procedure(s) Performed: Procedure(s): ARTHROPLASTY, HIP, TOTAL, ANTERIOR APPROACH (Right)  Patient Location: PACU  Anesthesia Type:General  Level of Consciousness:  sedated, patient cooperative and responds to stimulation  Airway & Oxygen Therapy:Patient Spontanous Breathing and Patient connected to face mask oxgen  Post-op Assessment:  Report given to PACU RN and Post -op Vital signs reviewed and stable  Post vital signs:  Reviewed and stable  Last Vitals:  Vitals:   11/12/23 1411 11/12/23 1523  BP: (!) 83/58 (!) 82/56  Pulse: 88 95  Resp: 16 18  Temp: 36.8 C   SpO2: 97% 98%    Complications: No apparent anesthesia complications

## 2023-11-12 NOTE — H&P (View-Only) (Signed)
 Reason for Consult: right hip fracture Referring Physician: Hildy Lowers, MD  Janet Cooper is an 89 y.o. female.  HPI:  Patient is a 89 year old female with a history of hypertension, hyperlipidemia, fecal and urinary incontinence who is presenting today after a fall.  She reports she was coming down 2 steps and she fell landing on her right side.  She was having severe pain in her right leg and was unable to get up.  She denies hitting her head or loss of consciousness.  She reports she always leaks a lot of stool but today has been a little bit more.  She denies any abdominal pain, shortness of breath, chest pain.  No headaches.  She is feeling some nausea after getting the pain medication.  She denies any numbness or tingling in her foot.  She reports as long as she does not move her right leg it does not hurt too bad.  She has never had surgery on this leg in the past.   Has some right arm pain, but not other UE complaints or LLE complaints.  She states that she lives alone.  She has no family in the [place] .  Her neighbor helps her with things when needed.  Past Medical History:  Diagnosis Date   Arthritis    back   Chronic cystitis    Detrusor instability of bladder    Fecal incontinence    Glaucoma of both eyes    History of endometrial cancer    1990 dx  Stage IB, Grade 1 endometrial carcinoma  s/p TAH w/ BSO--  recurrence 1994 vaginal cuff  s/p radiation   History of radiation therapy    1994  lower pelvic area for recurrent endometrial cancer at vaginal cuff   Hyperlipidemia    Hypertension    OAB (overactive bladder)    Osteopenia 05/2013   T score -2.1 FRAX 8.9%/2.5%   Urge urinary incontinence    Wears glasses     Past Surgical History:  Procedure Laterality Date   ANAL RECTAL MANOMETRY N/A 03/29/2014   Procedure: ANO RECTAL MANOMETRY;  Surgeon: Joyce Nixon, MD;  Location: [facility];  Service: Endoscopy;  Laterality: N/A;   ANAL RECTAL MANOMETRY N/A  04/28/2014   Procedure: ANO RECTAL MANOMETRY;  Surgeon: Joyce Nixon, MD;  Location: [facility];  Service: Endoscopy;  Laterality: N/A;   CATARACT EXTRACTION W/ INTRAOCULAR LENS  IMPLANT, BILATERAL  1990's   GLAUCOMA SURGERY Bilateral 2010   laser   PATELLA FRACTURE SURGERY Left 1995   SACRAL NERVE LEAD IMPLANTATION -- STAGE ONE INTERSTIM  09-01-2014   dr Joyce Nixon   fecal incontinence   STAGE TWO SACRAL NERVE STIMULATOR INTERSTIM  09-15-2014   TOTAL ABDOMINAL HYSTERECTOMY W/ BILATERAL SALPINGOOPHORECTOMY  1990    Family History  Problem Relation Age of Onset   Colon cancer Mother    Cancer Father        Liver cancer   Heart disease Maternal Grandmother    Heart disease Maternal Grandfather     Social History:  reports that she has never smoked. She has never used smokeless tobacco. She reports that she does not currently use alcohol after a past usage of about 1.0 standard drink of alcohol per week. She reports that she does not use drugs.  Allergies:  Allergies  Allergen Reactions   Homatropine Itching   Hydrocodone Itching   Sulfa Antibiotics Itching   Sulfamethoxazole Itching    Medications: I have reviewed the patient's current medications.  Scheduled:  Chlorhexidine Gluconate Cloth  6 each Topical Daily   dorzolamide-timolol  1 drop Both Eyes BID   latanoprost  1 drop Both Eyes QHS   pantoprazole  40 mg Oral Daily   [START ON 11/13/2023] simvastatin  10 mg Oral q1800   trimethoprim  100 mg Oral QPM    Results for orders placed or performed during the hospital encounter of 11/11/23 (from the past 24 hours)  Basic metabolic panel     Status: Abnormal   Collection Time: 11/11/23  8:38 PM  Result Value Ref Range   Sodium 138 135 - 145 mmol/L   Potassium 3.6 3.5 - 5.1 mmol/L   Chloride 112 (H) 98 - 111 mmol/L   CO2 20 (L) 22 - 32 mmol/L   Glucose, Bld 141 (H) 70 - 99 mg/dL   BUN 18 8 - 23 mg/dL   Creatinine, Ser 2.72 0.44 - 1.00 mg/dL   Calcium 7.6 (L) 8.9 -  10.3 mg/dL   GFR, Estimated >53 >66 mL/min   Anion gap 6 5 - 15  CBC with Differential     Status: Abnormal   Collection Time: 11/11/23  8:38 PM  Result Value Ref Range   WBC 14.4 (H) 4.0 - 10.5 K/uL   RBC 3.78 (L) 3.87 - 5.11 MIL/uL   Hemoglobin 12.0 12.0 - 15.0 g/dL   HCT 44.0 34.7 - 42.5 %   MCV 97.6 80.0 - 100.0 fL   MCH 31.7 26.0 - 34.0 pg   MCHC 32.5 30.0 - 36.0 g/dL   RDW 95.6 38.7 - 56.4 %   Platelets 179 150 - 400 K/uL   nRBC 0.0 0.0 - 0.2 %   Neutrophils Relative % 86 %   Neutro Abs 12.4 (H) 1.7 - 7.7 K/uL   Lymphocytes Relative 6 %   Lymphs Abs 0.9 0.7 - 4.0 K/uL   Monocytes Relative 6 %   Monocytes Absolute 0.8 0.1 - 1.0 K/uL   Eosinophils Relative 1 %   Eosinophils Absolute 0.1 0.0 - 0.5 K/uL   Basophils Relative 0 %   Basophils Absolute 0.1 0.0 - 0.1 K/uL   Immature Granulocytes 1 %   Abs Immature Granulocytes 0.10 (H) 0.00 - 0.07 K/uL  Protime-INR     Status: None   Collection Time: 11/11/23  8:38 PM  Result Value Ref Range   Prothrombin Time 13.6 11.4 - 15.2 seconds   INR 1.0 0.8 - 1.2  Type and screen [facility]     Status: None   Collection Time: 11/11/23  8:38 PM  Result Value Ref Range   ABO/RH(D) O POS    Antibody Screen NEG    Sample Expiration      11/14/2023,2359 Performed at [facility], [address], [place], [place] [address]   [place]     Status: None   Collection Time: 11/11/23  8:38 PM  Result Value Ref Range   Total CK 111 38 - 234 U/L  Magnesium     Status: None   Collection Time: 11/11/23  8:38 PM  Result Value Ref Range   Magnesium 2.1 1.7 - 2.4 mg/dL  Phosphorus     Status: None   Collection Time: 11/11/23  8:38 PM  Result Value Ref Range   Phosphorus 3.1 2.5 - 4.6 mg/dL  Hepatic function panel     Status: Abnormal   Collection Time: 11/11/23  8:38 PM  Result Value Ref Range   Total Protein 5.2 (L)  6.5 - 8.1 g/dL   Albumin 2.8 (L) 3.5 - 5.0 g/dL   AST 18 15 - 41 U/L   ALT 15 0 - 44 U/L    Alkaline Phosphatase 25 (L) 38 - 126 U/L   Total Bilirubin 0.5 0.0 - 1.2 mg/dL   Bilirubin, Direct <1.6 0.0 - 0.2 mg/dL   Indirect Bilirubin NOT CALCULATED 0.3 - 0.9 mg/dL     X-ray: CLINICAL DATA:  med clearance Pt arriving via [facility] for fall from second step. Right hip deformity,   EXAM: DG HIP (WITH OR WITHOUT PELVIS) 2-3V RIGHT   COMPARISON:  X-ray pelvis 03/13/2010   FINDINGS: Acute markedly superiorly and laterally displaced right intertrochanteric femoral fracture. No right hip dislocation. No acute displaced fracture or dislocation of the left hip. No acute displaced fracture or dislocation of the left hip on frontal view. No acute displaced fracture or diastasis of the bones of the pelvis. There is no evidence of arthropathy or other focal bone abnormality.   Degenerative changes of the lower lumbar spine.   IMPRESSION: Acute markedly superolaterally displaced right intertrochanteric femoral fracture.     Electronically Signed   By: Morgane  Naveau M.D.  ROS  Blood pressure (!) 92/52, pulse 86, temperature 98.8 F (37.1 C), temperature source Oral, resp. rate 18, SpO2 99%.  Physical Exam: Constitutional:      General: She is not in acute distress.    Appearance: She is well-developed.  HENT:     Head: Normocephalic and atraumatic.  Eyes:     Pupils: Pupils are equal, round, and reactive to light.  Cardiovascular:     Rate and Rhythm: Regular rhythm. Tachycardia present.     Pulses: Normal pulses.     Heart sounds: Normal heart sounds. No murmur heard.    No friction rub.  Pulmonary:     Effort: Pulmonary effort is normal.     Breath sounds: Normal breath sounds. No wheezing or rales.  Abdominal:     General: Bowel sounds are normal. There is no distension.     Palpations: Abdomen is soft.     Tenderness: There is no abdominal tenderness. There is no guarding or rebound.  Musculoskeletal:        General: Tenderness and deformity present.      Right hip: Deformity and tenderness present. Shortening with external rotation     Comments: No edema  Skin:    General: Skin is warm and dry.     Findings: No rash.  Neurological:     Mental Status: She is alert and oriented to person, place, and time.     Cranial Nerves: No cranial nerve deficit.     Comments: Normal movement and sensation in the right foot  Psychiatric:        Behavior: Behavior normal.   Assessment/Plan: Right hip fracture that seems to be on the border of a basicervical versus intertrochanteric femur fracture with displacement of the lesser trochanter  Plan: She has sustained a significant right hip fracture with displacement.  Operative repair is indicated.  Based on the appearance of her radiographs and the stability of her greater trochanter as well as her current independent living arrangement without family support I am going to proceed with a right total hip replacement to allow for early functional return with less risks of nonunion malunion and need for future surgeries.  Standard risks of infection DVT dislocation neurovascular injury would be consistent with that for hip replacement.  The postoperative course  will include postoperative physical therapy and short-term nursing facility with hopeful return to independent living.  There will be challenges with her overall recovery of normal activities of daily living particularly given the fact that she does not have social support. Orders placed for surgery today. N.p.o.  Bevin Bucks 11/12/2023, 12:26 PM

## 2023-11-12 NOTE — Discharge Instructions (Signed)

## 2023-11-12 NOTE — ED Notes (Signed)
 Pt changed into gown and cleaned and barrier cream placed on pt buttocks.  New bed pads placed under pt.  Pts diaper did not have any urine all night.  Pt also states this is mostly normal for her.  Pt also states she has issues with diarrhea and her BM's are usually very loose.

## 2023-11-13 ENCOUNTER — Inpatient Hospital Stay (HOSPITAL_COMMUNITY)

## 2023-11-13 ENCOUNTER — Encounter (HOSPITAL_COMMUNITY): Payer: Self-pay | Admitting: Orthopedic Surgery

## 2023-11-13 DIAGNOSIS — S72141A Displaced intertrochanteric fracture of right femur, initial encounter for closed fracture: Secondary | ICD-10-CM | POA: Diagnosis not present

## 2023-11-13 DIAGNOSIS — R579 Shock, unspecified: Secondary | ICD-10-CM | POA: Diagnosis not present

## 2023-11-13 DIAGNOSIS — R578 Other shock: Secondary | ICD-10-CM

## 2023-11-13 DIAGNOSIS — D649 Anemia, unspecified: Secondary | ICD-10-CM | POA: Diagnosis not present

## 2023-11-13 DIAGNOSIS — Z96641 Presence of right artificial hip joint: Secondary | ICD-10-CM

## 2023-11-13 DIAGNOSIS — E861 Hypovolemia: Secondary | ICD-10-CM

## 2023-11-13 DIAGNOSIS — S72001P Fracture of unspecified part of neck of right femur, subsequent encounter for closed fracture with malunion: Secondary | ICD-10-CM | POA: Diagnosis not present

## 2023-11-13 LAB — C DIFFICILE QUICK SCREEN W PCR REFLEX
C Diff antigen: NEGATIVE
C Diff interpretation: NOT DETECTED
C Diff toxin: NEGATIVE

## 2023-11-13 LAB — ECHOCARDIOGRAM COMPLETE
AR max vel: 2.27 cm2
AV Area VTI: 2 cm2
AV Area mean vel: 2.18 cm2
AV Mean grad: 6 mmHg
AV Peak grad: 10.4 mmHg
Ao pk vel: 1.61 m/s
Area-P 1/2: 2.93 cm2
Height: 60 in
S' Lateral: 2.2 cm
Weight: 1696.66 [oz_av]

## 2023-11-13 LAB — RENAL FUNCTION PANEL
Albumin: 2.6 g/dL — ABNORMAL LOW (ref 3.5–5.0)
Anion gap: 11 (ref 5–15)
BUN: 15 mg/dL (ref 8–23)
CO2: 13 mmol/L — ABNORMAL LOW (ref 22–32)
Calcium: 6.8 mg/dL — ABNORMAL LOW (ref 8.9–10.3)
Chloride: 114 mmol/L — ABNORMAL HIGH (ref 98–111)
Creatinine, Ser: 0.84 mg/dL (ref 0.44–1.00)
GFR, Estimated: >60 mL/min (ref >60)
Glucose, Bld: 117 mg/dL — ABNORMAL HIGH (ref 70–99)
Phosphorus: 3 mg/dL (ref 2.5–4.6)
Potassium: 3 mmol/L — ABNORMAL LOW (ref 3.5–5.1)
Sodium: 138 mmol/L (ref 135–145)

## 2023-11-13 LAB — IRON AND TIBC
Iron: 99 ug/dL (ref 28–170)
Saturation Ratios: 58 % — ABNORMAL HIGH (ref 10.4–31.8)
TIBC: 169 ug/dL — ABNORMAL LOW (ref 250–450)
UIBC: 70 ug/dL

## 2023-11-13 LAB — VITAMIN B12: Vitamin B-12: 905 pg/mL (ref 180–914)

## 2023-11-13 LAB — CBC
HCT: 30.3 % — ABNORMAL LOW (ref 36.0–46.0)
Hemoglobin: 10 g/dL — ABNORMAL LOW (ref 12.0–15.0)
MCH: 31.3 pg (ref 26.0–34.0)
MCHC: 33 g/dL (ref 30.0–36.0)
MCV: 94.7 fL (ref 80.0–100.0)
Platelets: 101 10*3/uL — ABNORMAL LOW (ref 150–400)
RBC: 3.2 MIL/uL — ABNORMAL LOW (ref 3.87–5.11)
RDW: 16.6 % — ABNORMAL HIGH (ref 11.5–15.5)
WBC: 8.5 10*3/uL (ref 4.0–10.5)
nRBC: 0 % (ref 0.0–0.2)

## 2023-11-13 LAB — FOLATE: Folate: 15.2 ng/mL (ref >5.9)

## 2023-11-13 LAB — PHOSPHORUS: Phosphorus: 3 mg/dL (ref 2.5–4.6)

## 2023-11-13 LAB — MAGNESIUM: Magnesium: 1.6 mg/dL — ABNORMAL LOW (ref 1.7–2.4)

## 2023-11-13 LAB — LACTIC ACID, PLASMA: Lactic Acid, Venous: <0.3 mmol/L — ABNORMAL LOW (ref 0.5–1.9)

## 2023-11-13 LAB — MRSA NEXT GEN BY PCR, NASAL: MRSA by PCR Next Gen: NOT DETECTED

## 2023-11-13 LAB — FERRITIN: Ferritin: 135 ng/mL (ref 11–307)

## 2023-11-13 MED ORDER — SODIUM CHLORIDE 0.9 % IV SOLN: 250.0000 mL | INTRAVENOUS | Status: AC

## 2023-11-13 MED ORDER — MAGNESIUM SULFATE 2 GM/50ML IV SOLN
2.0000 g | Freq: Once | INTRAVENOUS | Status: AC
  Administered 2023-11-13: 2 g via INTRAVENOUS
  Filled 2023-11-13: qty 50

## 2023-11-13 MED ORDER — POTASSIUM CHLORIDE 10 MEQ/100ML IV SOLN
10.0000 meq | INTRAVENOUS | Status: AC
  Administered 2023-11-13 (×4): 10 meq via INTRAVENOUS
  Filled 2023-11-13 (×4): qty 100

## 2023-11-13 MED ORDER — ASPIRIN 81 MG PO CHEW
81.0000 mg | CHEWABLE_TABLET | Freq: Two times a day (BID) | ORAL | Status: DC
  Administered 2023-11-13 – 2023-11-20 (×15): 81 mg via ORAL
  Filled 2023-11-13 (×15): qty 1

## 2023-11-13 MED ORDER — MIDODRINE HCL 5 MG PO TABS
5.0000 mg | ORAL_TABLET | Freq: Three times a day (TID) | ORAL | Status: DC
  Administered 2023-11-13 (×3): 5 mg via ORAL
  Filled 2023-11-13 (×4): qty 1

## 2023-11-13 MED ORDER — ENSURE PLUS HIGH PROTEIN PO LIQD
237.0000 mL | Freq: Two times a day (BID) | ORAL | Status: DC
  Administered 2023-11-13 – 2023-11-17 (×3): 237 mL via ORAL
  Administered 2023-11-17: 1 via ORAL
  Administered 2023-11-18 – 2023-11-20 (×3): 237 mL via ORAL

## 2023-11-13 MED ORDER — PHENYLEPHRINE HCL-NACL 20-0.9 MG/250ML-% IV SOLN
INTRAVENOUS | Status: AC
  Administered 2023-11-13: 50 ug/min via INTRAVENOUS
  Filled 2023-11-13: qty 250

## 2023-11-13 MED ORDER — LACTATED RINGERS IV SOLN: INTRAVENOUS | Status: AC

## 2023-11-13 MED ORDER — MIDODRINE HCL 5 MG PO TABS
10.0000 mg | ORAL_TABLET | Freq: Three times a day (TID) | ORAL | Status: DC
  Administered 2023-11-13 – 2023-11-16 (×9): 10 mg via ORAL
  Filled 2023-11-13 (×10): qty 2

## 2023-11-13 MED ORDER — SODIUM BICARBONATE 8.4 % IV SOLN
INTRAVENOUS | Status: DC
  Filled 2023-11-13: qty 1000
  Filled 2023-11-13 (×4): qty 150

## 2023-11-13 MED ORDER — CALCIUM GLUCONATE-NACL 1-0.675 GM/50ML-% IV SOLN
1.0000 g | Freq: Once | INTRAVENOUS | Status: AC
  Administered 2023-11-13: 1000 mg via INTRAVENOUS
  Filled 2023-11-13: qty 50

## 2023-11-13 MED ORDER — ALBUMIN HUMAN 25 % IV SOLN
25.0000 g | Freq: Once | INTRAVENOUS | Status: AC
  Administered 2023-11-13: 25 g via INTRAVENOUS
  Filled 2023-11-13: qty 100

## 2023-11-13 MED ORDER — FAMOTIDINE 20 MG PO TABS
20.0000 mg | ORAL_TABLET | Freq: Every day | ORAL | Status: DC
  Administered 2023-11-13 – 2023-11-20 (×8): 20 mg via ORAL
  Filled 2023-11-13 (×8): qty 1

## 2023-11-13 MED ORDER — PHENYLEPHRINE HCL-NACL 20-0.9 MG/250ML-% IV SOLN: 25.0000 ug/min | INTRAVENOUS | Status: DC

## 2023-11-13 MED ORDER — ADULT MULTIVITAMIN W/MINERALS CH
1.0000 | ORAL_TABLET | Freq: Every day | ORAL | Status: DC
  Administered 2023-11-13 – 2023-11-20 (×8): 1 via ORAL
  Filled 2023-11-13 (×8): qty 1

## 2023-11-13 NOTE — Hospital Course (Addendum)
 The patient is an 89 year old female history of hypertension, hyperlipidemia presenting to the ED while was walking down 2 steps into her garage and landed on her right side with significant right hip pain. Imaging done consistent with right hip fracture.  She underwent surgical intervention but was also noted to be hypotensive.  Antihypertensives were held and she is typed and screened transfuse 1 unit PRBCs. Transferred to the ICU. CCM was consulted given her hypotension she had to be placed on low-dose Neo-Synephrine which has been weaned off.  Hemoglobin is stable now. PT/OT was recommending CIR but unlike to be approved so now recommendation is for SNF. GI Pathogen Panel Negative so will add Antidiarrheals and diarrhea is slowly improving.. BP improving and will CTM. Improving slowly and transferred to Telemetry 11/16/23.  She is complaining of some shortness of breath so IV fluid hydration was stopped and obtain chest x-ray as below.  She improved in her breathing status after she given some Lasix.  Blood pressure remained stable and blood count is improving.  We are transitioning her IV antibiotics to orals.  She appears medically stable for discharge to go to SNF for further rehabilitative efforts.  Assessment and Plan:  Right Intertrochanteric Femoral Fracture Secondary to mechanical fall:  Patient noted to be hypotensive and have low blood pressure which she states is at baseline and on antihypertensive medications. Patient states over the past few days has been having feelings of motion sickness and just not feeling too well with some nausea. Patient seen in consultation by orthopedics and patient underwent Surgical Repair 11/12/23. PT/OT to evaluate and treat and recently recommended CIR but they do not think she is a candidate so no pursuing SNF and awaiting insurance authorization and bed placement.  She appears medically stable for discharge at this time now   Hypotension with shock: IMPROVED.  Patient noted with systolic blood pressures in the 70s to 80s on presentation to the ED however patient alert oriented and answering questions appropriately.  Shock is likely multifactorial given her blood loss, pain meds, Bacteremia and antihypertensives; patient states on antihypertensive medications which she was told to continue per PCP despite low/soft blood pressure. Given IVF bolus and Resuscitation. - IV albumin  25 g x 2. Had to be placed on Short term Pressors. IVF now stopped.  Discontinue antihypertensive medications and likely will not resume on discharge.  Started her on midodrine  and will continue; Was increased to 10 mg 3 times daily but cut back to 5 mg TID given improvement in BP and will now further cut back to 2.5 mg po TID.  -Checking ECHOCardiogram and showed an EF of 65-70% w/ no RWMA; Left ventricular diastolic parameters are indeterminate. And RVSF was normal -Checked a UA and not very impressive but Urine Cx showing 10,000 CFU of ESBL E Coli. Blood Cx as below. Patient noted with an anemia will transfuse 1 unit PRBCs. Will check Cortisol Level in the AM and was 5.8. PCCM was consulted but signed off the case.  -Continue monitor blood pressures per protocol and last blood pressure was improved as below -Palliative Care consulted for GOC Discussion  GNR Bacteremia: 1/4 Bottles +. Was on IV Ceftriaxone  and Flagyl  and will change to po Augmentin. Nothing was positive on BCID but likely Anaerobe and was showing Bacteroides Uniformis that was Beta Lactamase positive. CTM and repeat Blood Cx in the AM  Nausea and Diarrhea: Improving.  C Diff and GI Pathogen Panel Negative. C/w Supportive Care and Antidiarrheals w/  Loperamide. IVF as below to stop. Consulted Palliative Care Medicine given her frustration w/ Diarrhea but it has been chronic over 10-15 years. She has had Fecal Incontinence for a little while now and appears stable   Dyspnea and SOB: Improved. Checked CXR which showed Small  bilateral pleural effusions, left greater than right and  Dense left lower lobe consolidation, which may reflect atelectasis or pneumonia. She is now - 932.9 mL since Admission. Will add Xopenex/Atrovent PRN. BNP was 472.9. Given a dose of IV Lasix 20 mg on 6/15 and IVF stopped.    Essential Hypertension: Hold antihypertensive medications secondary to problem #2. Patient noted to have low blood pressure on presentation, states BP has been low/soft with systolics in the 100s however was told to continue her antihypertensive medications per her PCP. Will discontinue antihypertensive medications and NOT resume on discharge. BP is improved and IVF now stopping. Last BP reading was 101/73   Normocytic Anemia: Patient noted to have a drop in hemoglobin of 8.2 from 12.0 on admission. Patient with no overt bleeding but FOBT is Positive, however her skin around her rectum is so broken down though; Per nurse her stool doesn't look like it has any blood in it but the skin around is bloody when she is cleaned. Underwent surgical intervention.  Anemia panel done and showed an iron level of 99, UIBC of 78, TIBC 169, saturation ratios of 58%, ferritin of 135, folate 15.2 and vitamin B12 105. Typed and screened and Transfused 1 unit of PRBCs due to presentation with hypotension. CTM for S/Sx of Bleeding. Hgb/Hct Trend: Recent Labs  Lab 11/12/23 2021 11/13/23 1610 11/14/23 0324 11/15/23 0327 11/16/23 1020 11/17/23 0337 11/18/23 0316  HGB 7.1* 10.0* 8.0* 8.3* 10.1* 9.0* 9.7*  HCT 22.8* 30.3* 23.9* 25.0* 30.4* 28.4* 29.2*  MCV 104.6* 94.7 92.6 94.0 95.0 96.3 94.2  -Repeat CBC in the AM   Thrombocytopenia: Improved. Platelet Count is now went from 162 -> 108 -> 101 -> 84 -> 100 -> 159 -> 161. CTM for S/Sx of Bleeding; no overt bleeding noted. Repeat CBC in the AM   Metabolic Acidosis: Improved. Now has a CO2 of 25, AG of 5, Chloride Level of 106. IVF now stopped. CTM and Trend and repeat CMP in the AM    Hyperlipidemia: C/w Simvastatin  10 mg po Daily  Hypokalemia: K+ is now 4.2.CTM and Replete as Necessary. Repeat CMP in the AM  Hypophosphatemia: Phos Level is now 3.3. CTM and Replete as Necessary. Repeat CMP in the AM  Hypomagnesemia: Mag Level is now 2.0. CTM and Replete as Necessary. Repeat Mag Level in the AM  Hyperbilirubinemia: Mild. T Bili went from 0.8 -> 1.0 -> 1.3 -> 1.4. CTM and Trend and repeat CMP in the AM  GERD/GI Prophylaxis: PCCM has started her on Famotidine  20 mg po Daily   Hypoalbuminemia: Patient's Albumin  Level is now 2.4. CTM and Trend and repeat CMP in the AM

## 2023-11-13 NOTE — Progress Notes (Signed)
 eLink Physician-Brief Progress Note Patient Name: Janet Cooper DOB: 1930-05-28 MRN: [id number]   Date of Service  11/13/2023  HPI/Events of Note  Patient admitted to the ICU for post-op monitoring s/p right total hip arthroplasty  for hip fracture sustained following a fall.  eICU Interventions  New Patient Evaluation.        Dain Laseter U Blimy Napoleon 11/13/2023, 12:29 AM

## 2023-11-13 NOTE — Progress Notes (Addendum)
 ICU Consult    NAME:  Janet Cooper, MRN:  [id number], DOB:  02-05-1930, LOS: 2 ADMISSION DATE:  11/11/2023 CONSULTATION DATE:  11/13/23 REFERRING J Sharion Davidson ACNPC-AG CHIEF COMPLAINT:  Hypotension intraoperatively requiring pressor  Brief History   Fall while utilizing stairs resulting in hip fracture Intraoperative hypotension and drop in hemoglobin requiring PRBCs and pressors  History of present illness   Total Right Hip requiring PRBCs and Pressor   Past Medical History  Arthritis  Cystitis  Detrusor instability  Fecal Incontinence Endometrial Cancer w Radiation 1990/1994 Hyperlipidemia HTN OAB Osteopenia Urge Incontinence Eye glasses  Significant Hospital Events   Hypotension and drop in Hgb intraoperatively requiring PRBCs and Pressor  Consults:  CCM-Dr. Florie Husband consult Ortho-Dr. Bernard Brick -pre-existing consult for OR  Procedures:  Total right hip in OR- > ICU  Significant Diagnostic Tests:  None currently Prior Imaging available   Micro Data:    Latest Reference Range & Units 11/12/23 22:48  MRSA by PCR Next Gen NOT DETECTED  NOT DETECTED    Antimicrobials:  Ancef  2 g every 6 hours  CHG cloths daily  Interim history/subjective:  Patient explains borderline hypotension with blood  pressure normally ( AZOR 5-40) Patient became hypotensive intraoperatively with drop in hemoglobin   Latest Reference Range & Units 11/11/23 20:38 11/12/23 12:59 11/12/23 20:21  WBC 4.0 - 10.5 K/uL 14.4 (H) 7.8 8.8  RBC 3.87 - 5.11 MIL/uL 3.78 (L) 2.52 (L) 2.18 (L)  Hemoglobin 12.0 - 15.0 g/dL 40.9 8.2 (L) 7.1 (L)  HCT 36.0 - 46.0 % 36.9 25.3 (L) 22.8 (L)  MCV 80.0 - 100.0 fL 97.6 100.4 (H) 104.6 (H)  MCH 26.0 - 34.0 pg 31.7 32.5 32.6  MCHC 30.0 - 36.0 g/dL 81.1 91.4 78.2  RDW 95.6 - 15.5 % 13.2 13.2 13.9  Platelets 150  - 400 K/uL 179 162 108 (L)  nRBC 0.0 - 0.2 % 0.0 0.0 0.0  (H): Data is abnormally high (L): Data is abnormally low    Objective    Blood pressure (!) 108/50, pulse 88, temperature 98.2 F (36.8 C), temperature source Oral, resp. rate 18, height 5' (1.524 m), weight 48.1 kg, SpO2 100%.        Intake/Output Summary (Last 24 hours) at 11/13/2023 0008 Last data filed at 11/12/2023 1734 Gross per 24 hour  Intake 1665 ml  Output 1285 ml  Net 380 ml   Filed Weights   11/12/23 1411  Weight: 48.1 kg    Examination: General: alert/oriented x3 HENT: NCAT, dry MM Lungs: CTAB, normal work of breathing Cardiovascular: RRR, no murmur, no JVD  Abdomen: soft, nontender, normal bowel sounds Extremities: warm, well-perfused without  cyanosis Neuro: grossly nonfocal, follows commands  Resolved Hospital Problem list   Blood pressure maintaining between 105-115 SBP on NEO  Assessment & Plan:  CCM consult Complete PRBCs Wean NEO as able   Best practice:  Diet: Advance as tolerated  Pain/Anxiety/Delirium protocol  VAP protocol- cough deep breathe postoperatively GI prophylaxis: Protonix 40mg  Daily  Mobility: As directed by Ortho  Code Status: LIMITED  Family Communication: by OR / Patient  Disposition: In ICU w Friend present   Labs   CBC: Recent Labs  Lab 11/11/23 2038 11/12/23 1259 11/12/23 2021  WBC 14.4* 7.8 8.8  NEUTROABS 12.4*  --   --   HGB 12.0 8.2* 7.1*  HCT 36.9 25.3* 22.8*  MCV 97.6 100.4* 104.6*  PLT 179 162 108*    Basic Metabolic Panel: Recent Labs  Lab 11/11/23 2038 11/12/23 1259  NA 138 139  K 3.6 3.8  CL 112* 115*  CO2 20* 17*  GLUCOSE 141* 109*  BUN 18 17  CREATININE 0.75 0.83  CALCIUM 7.6* 7.1*  MG 2.1  --   PHOS 3.1  --    GFR: Estimated Creatinine Clearance: 31.7 mL/min (by C-G formula based on SCr of 0.83 mg/dL). Recent Labs  Lab 11/11/23 2038 11/12/23 1259 11/12/23 2021  WBC 14.4* 7.8 8.8    Liver Function Tests: Recent Labs   Lab 11/11/23 2038 11/12/23 1259  AST 18 19  ALT 15 14  ALKPHOS 25* 20*  BILITOT 0.5 0.8  PROT 5.2* 4.5*  ALBUMIN 2.8* 2.4*   No results for input(s): LIPASE, AMYLASE in the last 168 hours. No results for input(s): AMMONIA in the last 168 hours.  ABG    Component Value Date/Time   TCO2 23 10/06/2015 0754     Coagulation Profile: Recent Labs  Lab 11/11/23 2038  INR 1.0    Cardiac Enzymes: Recent Labs  Lab 11/11/23 2038  CKTOTAL 111    HbA1C: No results found for: HGBA1C  CBG: No results for input(s): GLUCAP in the last 168 hours.   Past Medical History  She,  has a past medical history of Arthritis, Chronic cystitis, Detrusor instability of bladder, Fecal incontinence, Glaucoma of both eyes, History of endometrial cancer, History of radiation therapy, Hyperlipidemia, Hypertension, OAB (overactive bladder), Osteopenia (05/2013), Urge urinary incontinence, and Wears glasses.   Surgical History    Past Surgical History:  Procedure Laterality Date   ANAL RECTAL MANOMETRY N/A 03/29/2014   Procedure: ANO RECTAL MANOMETRY;  Surgeon: Joyce Nixon, MD;  Location: [facility];  Service: Endoscopy;  Laterality: N/A;   ANAL RECTAL MANOMETRY N/A 04/28/2014   Procedure: ANO RECTAL MANOMETRY;  Surgeon: Joyce Nixon, MD;  Location: [facility];  Service: Endoscopy;  Laterality: N/A;   CATARACT EXTRACTION W/ INTRAOCULAR LENS  IMPLANT, BILATERAL  1990's   GLAUCOMA SURGERY Bilateral 2010   laser   PATELLA FRACTURE SURGERY Left 1995   SACRAL NERVE LEAD IMPLANTATION -- STAGE ONE INTERSTIM  09-01-2014   dr Joyce Nixon   fecal incontinence   STAGE TWO SACRAL NERVE STIMULATOR INTERSTIM  09-15-2014   TOTAL ABDOMINAL HYSTERECTOMY W/ BILATERAL SALPINGOOPHORECTOMY  1990     Social History   reports that she has never smoked. She has never used smokeless tobacco. She reports that she does not currently use alcohol after a past usage of about 1.0 standard drink of  alcohol per week. She reports that she does not use drugs.   Family History   Her family history includes Cancer in her father;  Colon cancer in her mother; Heart disease in her maternal grandfather and maternal grandmother.   Allergies Allergies  Allergen Reactions   Homatropine Itching   Hydrocodone Itching   Sulfa Antibiotics Itching   Sulfamethoxazole Itching     Home Medications  Prior to Admission medications   Medication Sig Start Date End Date Taking? Authorizing Provider  amLODipine-olmesartan (AZOR) 5-40 MG tablet TAKE 1 TABLET BY MOUTH EVERY DAY 12/01/11  Yes [provider]  aspirin 81 MG EC tablet Take 81 mg by mouth.   Yes [provider]  B Complex-C (B-COMPLEX WITH VITAMIN C) tablet Take 1 tablet by mouth daily.   Yes [provider]  Calcium Citrate-Vitamin D 200-250 MG-UNIT TABS Take 1 tablet by mouth daily.   Yes [provider]  dorzolamide (TRUSOPT) 2 % ophthalmic solution Place 1 drop into both eyes 2 (two) times daily. 10/30/22  Yes [provider]  glucosamine-chondroitin 500-400 MG tablet Take 1 tablet by mouth daily.   Yes [provider]  latanoprost (XALATAN) 0.005 % ophthalmic solution INSTILL 1 DROP INTO BOTH EYES AT BEDTIME 12/06/11  Yes [provider]  MAGNESIUM PO Take 1 tablet by mouth daily.   Yes [provider]  omeprazole (PRILOSEC) 40 MG capsule Take 40 mg by mouth daily.   Yes [provider]  PROLIA 60 MG/ML SOSY injection Inject 60 mg into the skin every 6 (six) months. 05/29/22  Yes [provider]  simvastatin (ZOCOR) 10 MG tablet TAKE 1 TABLET BY MOUTH EVERY DAY NIGHTLY 04/26/15  Yes [provider]  trimethoprim (TRIMPEX) 100 MG tablet Take 100 mg by mouth every evening.   Yes [provider]  Trospium Chloride 60 MG CP24 Take 1 tablet by mouth daily.   Yes [provider]  methocarbamol  (ROBAXIN ) 500 MG tablet Take 1 tablet (500  mg total) by mouth 2 (two) times daily as needed. Patient not taking: Reported on 11/11/2023 09/15/19   Sandie Cross, PA-C  predniSONE  (STERAPRED UNI-PAK 21 TAB) 10 MG (21) TBPK tablet Take as directed Patient not taking: Reported on 11/11/2023 09/15/19   Sandie Cross, PA-C      Denece Finger MSNA MSN ACNPC-AG Acute Care Nurse Practitioner [facility]

## 2023-11-13 NOTE — Anesthesia Postprocedure Evaluation (Signed)
 Anesthesia Post Note  Patient: Janet Cooper  Procedure(s) Performed: ARTHROPLASTY, HIP, TOTAL, ANTERIOR APPROACH (Right: Hip)     Patient location during evaluation: PACU Anesthesia Type: General Level of consciousness: awake and alert Pain management: pain level controlled Vital Signs Assessment: post-procedure vital signs reviewed and stable Respiratory status: spontaneous breathing, nonlabored ventilation, respiratory function stable and patient connected to nasal cannula oxygen Cardiovascular status: stable (requiring intermittent vasopressors to maintain adequate MAP. CBC ordered to assess need for further transfusion) Postop Assessment: no apparent nausea or vomiting Anesthetic complications: no  No notable events documented.  Last Vitals:  Vitals:   11/13/23 1830 11/13/23 1856  BP: (!) 93/44   Pulse: 78   Resp: (!) 23   Temp:  36.6 C  SpO2: 97%     Last Pain:  Vitals:   11/13/23 1856  TempSrc: Oral  PainSc:                  Juventino Oppenheim

## 2023-11-13 NOTE — Progress Notes (Signed)
   Subjective: 1 Day Post-Op Procedure(s) (LRB): ARTHROPLASTY, HIP, TOTAL, ANTERIOR APPROACH (Right) Patient reports pain as mild.   Patient seen in rounds for Dr. Bernard Brick. Patient is resting in bed on exam this morning in step down. She tells me she is not feeling well at all, but cannot decide why she feels bad. She tells me she felt this way after cancer treatment many years ago.    Objective: Vital signs in last 24 hours: Temp:  [97 F (36.1 C)-98.9 F (37.2 C)] 97.9 F (36.6 C) (06/11 0500) Pulse Rate:  [61-97] 73 (06/11 0700) Resp:  [9-27] 18 (06/11 0700) BP: (72-136)/(46-75) 103/51 (06/11 0700) SpO2:  [83 %-100 %] 98 % (06/11 0700) Weight:  [48.1 kg] 48.1 kg (06/10 1411)  Intake/Output from previous day:  Intake/Output Summary (Last 24 hours) at 11/13/2023 0746 Last data filed at 11/13/2023 1610 Gross per 24 hour  Intake 3087.33 ml  Output 1885 ml  Net 1202.33 ml     Intake/Output this shift: No intake/output data recorded.  Labs: Recent Labs    11/11/23 2038 11/12/23 1259 11/12/23 2021 11/13/23 0632  HGB 12.0 8.2* 7.1* 10.0*   Recent Labs    11/12/23 2021 11/13/23 0632  WBC 8.8 8.5  RBC 2.18*  2.16* 3.20*  HCT 22.8* 30.3*  PLT 108* 101*   Recent Labs    11/12/23 1259 11/13/23 0632  NA 139 138  K 3.8 3.0*  CL 115* 114*  CO2 17* 13*  BUN 17 15  CREATININE 0.83 0.84  GLUCOSE 109* 117*  CALCIUM 7.1* 6.8*   Recent Labs    11/11/23 2038  INR 1.0    Exam: General - Patient is Alert and Oriented Extremity - Neurologically intact Sensation intact distally Intact pulses distally Dorsiflexion/Plantar flexion intact Dressing - dressing C/D/I Motor Function - intact, moving foot and toes well on exam.   Past Medical History:  Diagnosis Date   Arthritis    back   Chronic cystitis    Detrusor instability of bladder    Fecal incontinence    Glaucoma of both eyes    History of endometrial cancer    1990 dx  Stage IB, Grade 1 endometrial  carcinoma  s/p TAH w/ BSO--  recurrence 1994 vaginal cuff  s/p radiation   History of radiation therapy    1994  lower pelvic area for recurrent endometrial cancer at vaginal cuff   Hyperlipidemia    Hypertension    OAB (overactive bladder)    Osteopenia 05/2013   T score -2.1 FRAX 8.9%/2.5%   Urge urinary incontinence    Wears glasses     Assessment/Plan: 1 Day Post-Op Procedure(s) (LRB): ARTHROPLASTY, HIP, TOTAL, ANTERIOR APPROACH (Right) Principal Problem:   Closed right hip fracture (HCC) Active Problems:   Hyperlipidemia   Hypertension   S/P total right hip arthroplasty   Anemia   Hypotension   Intertrochanteric fracture of right hip, closed, initial encounter (HCC)   Shock (HCC)  Estimated body mass index is 20.71 kg/m as calculated from the following:   Height as of this encounter: 5' (1.524 m).   Weight as of this encounter: 48.1 kg.  Hgb 10 this AM - s/p 2 units PRBC BP improving Still feeling very weak this morning  Up with PT today if able to tolerate She is allowed to roll / lay on either side if comfortable    Kim Pen, PA-C Orthopedic Surgery [phone] 11/13/2023, 7:46 AM

## 2023-11-13 NOTE — Progress Notes (Signed)
 NAME:  Janet Cooper, MRN:  [id number], DOB:  10-Sep-1929, LOS: 2 ADMISSION DATE:  11/11/2023 CONSULTATION DATE:  11/12/2023 REFERRING MD:  Hildy Lowers - TRH, CHIEF COMPLAINT:  Fall, now with hypotension/shock   History of Present Illness:  A 89 year old woman who presented to [facility] 6[facility] for fall w/ resultant superolaterally displaced R intertrochanteric femoral fracture. PMHx significant for HTN, HLD, chronic cystitis/frequent UTIs, OAB, endometrial CA (stage 1B s/p TAH/BSO, s/p radiation) 1990-1994.  Underwent R THA 6/10 with Ortho (Dr. Bernard Brick); patent tolerated well. EBL . Postoperatively, patient was extubated. Patient became borderline hypotensive 6/10 requiring vasopressor initiation (Neosynephrine) and given a bolus 1 L NS 0.9% and Albumin 25 gm.  She has borderline low BP and vertigo on/off for 6 months (?dizziness) and she was instructed to continue BP meds anyway. She has no hx of heart disease or strokes.   Denied CP, SOB, f/c/r, N/V/D, and LL edema  PCCM consulted for hypotension with concern for shock.   Pertinent Medical History:   Past Medical History:  Diagnosis Date   Arthritis    back   Chronic cystitis    Detrusor instability of bladder    Fecal incontinence    Glaucoma of both eyes    History of endometrial cancer    1990 dx  Stage IB, Grade 1 endometrial carcinoma  s/p TAH w/ BSO--  recurrence 1994 vaginal cuff  s/p radiation   History of radiation therapy    1994  lower pelvic area for recurrent endometrial cancer at vaginal cuff   Hyperlipidemia    Hypertension    OAB (overactive bladder)    Osteopenia 05/2013   T score -2.1 FRAX 8.9%/2.5%   Urge urinary incontinence    Wears glasses    Significant Hospital Events: Including procedures, antibiotic start and stop dates in addition to other pertinent events   6/9 - Presented to [facility] ED after fall at home with R-sided leg pain. CT showed acute markedly superolaterally displaced R intertrochanteric femoral  fracture; started Neosynephrine.got blood.  6/10 weaning off pressors over night   Interim History / Subjective:  Pain controlled. Having some nausea   Objective:   Blood pressure (!) 103/51, pulse 73, temperature 97.9 F (36.6 C), temperature source Oral, resp. rate 18, height 5' (1.524 m), weight 48.1 kg, SpO2 98%.        Intake/Output Summary (Last 24 hours) at 11/13/2023 0737 Last data filed at 11/13/2023 0642 Gross per 24 hour  Intake 3087.33 ml  Output 1885 ml  Net 1202.33 ml   Filed Weights   11/12/23 1411  Weight: 48.1 kg   Physical Examination: General resting in bed. No distress Hent NCAT no JVD Pulm clear  Card rrr w/ no MRG Abd soft BS present Neuro intact Ext warm dry no edema surgical site unremarkable   Resolved Hospital Problem List:   Assessment & Plan:  Shock, mutlifactorial.  postop acute blood loss, sedatives, pain meds, and Robaxin .  -off pressors.  Plan Keep euvolemic at this point SBP goal > 100 Cont midodrine w/ goal to wean to off over next few days Dc neo from med list  Follow wbc and cultures ECHO pending   H/o dizziness and vertigo Plan Seems like tele would be helpful here   H/o HTN and HLD plan Would cont to hold her CCB and ARB work on weaning midodrine off first  OK to resume her statin Resume ASA when ok w/ surgical team   Chronic cystitis/frequent UTIs OAB Plan On  prophylactic Abx  Right hip fracture, s/p total hip replacement  Plan Per surg   Moderate PCM Plan Dietitian consult  Best Practice: (right click and Reselect all SmartList Selections daily)   Diet/type: NPO w/ oral meds DVT prophylaxis: SCD GI prophylaxis: PPI Lines: N/A Foley:  Yes, and it is still needed Code Status:  DNR Last date of multidisciplinary goals of care discussion []    Critical care time:NA  We are signing off  Call PRN

## 2023-11-13 NOTE — Progress Notes (Signed)
 PROGRESS NOTE    Janet Cooper  ZOX:[id number] DOB: [birth year] DOA: 11/11/2023 PCP: Lory Rough., PA-C   Brief Narrative:  The patient is an 89 year old female history of hypertension, hyperlipidemia presenting to the ED while was walking down 2 steps into her garage and landed on her right side with significant right hip pain. Imaging done consistent with right hip fracture.  She underwent surgical intervention but was also noted to be hypotensive.  Antihypertensives were held and she is typed and screened transfuse 1 unit PRBCs. Transferred to the ICU. CCM was consulted given her hypotension she had to be placed on low-dose Neo-Synephrine which has been weaned off.  Assessment and Plan:  Right intertrochanteric femoral fracture Secondary to mechanical fall:  Patient noted to be hypotensive and have low blood pressure which she states is at baseline and on antihypertensive medications. Patient states over the past few days has been having feelings of motion sickness and just not feeling too well with some nausea. Patient seen in consultation by orthopedics and patient underwent Surgical Repair 11/12/23.    Hypotension with shock: Patient noted with systolic blood pressures in the 70s to 80s on presentation to the ED however patient alert oriented and answering questions appropriately.  Shock is likely multifactorial given her blood loss, pain meds and antihypertensives patient states on antihypertensive medications which she was told to continue per PCP despite low/soft blood pressure.  Will give a fluid bolus. - IV albumin 25 g x 1 again. Had to be placed on Short term Pressors. IVF as below.  Discontinue antihypertensive medications and likely will not resume on discharge.  Started her on midodrine and will continue increased to 10 mg 3 times daily. -Checking ECHOCardiogram and showed an EF of 65-70% w/ no RWMA -Check a UA and not very impressive but Urine Cx showing 10,000 CFU of E Coli  w/ Sensitivities pending. Check blood cultures x 2. Patient noted with an anemia will transfuse 1 unit PRBCs. Will check Cortisol Level in the AM. PCCM was consulted but signed off the case.    Essential Hypertension: Hold antihypertensive medications secondary to problem #2. Patient noted to have low blood pressure on presentation, states BP has been low/soft with systolics in the 100s however was told to continue her antihypertensive medications per her PCP. Will discontinue antihypertensive medications and not resume on discharge.   Normocytic Anemia: -Patient noted to have a drop in hemoglobin of 8.2 from 12.0 on admission. Patient with no overt bleeding.  Underwent surgical intervention.  Anemia panel done and showed an iron level of 99, UIBC of 78, TIBC 169, saturation ratios of 58%, ferritin of 135, folate 15.2 and vitamin B12 105.  Placed on IV PPI 40 mg every 24 and she was typed and screened and Transfused 1 unit of PRBCs due to presentation with hypotension. CTM for S/Sx of Bleeding   Thrombocytopenia: Platelet Count is now went from 162 -> 108 -> 101. CTM for S/Sx of Bleeding; no overt bleeding noted. Repeat CBC in the AM   Metabolic Acidosis: Now has a CO2 of 13, AG of 11, Chloride Level of 114. Start IV Sodium Bicarbonate @ 75 mL/hr.  CTM and Trend and repeat CMP in the AM   Hyperlipidemia: C/w Simvastatin 10 mg po Daily  Hypokalemia: K+ is now 3.0. Replete w/ IV KCL 40 mEQ x1. CTM and Replete as Necessary. Repeat CMP in the AM  Hypomagnesemia: Mag Level is now 1.6. Replete w/ IV Mag Sulfate 2  grams. CTM and Replete as Necessary. Repeat Mag Level in the AM  GERD/GI prophylaxis: PCCM has started her on famotidine 12 hours/day   DVT prophylaxis: SCDs Start: 11/11/23 2346    Code Status: Limited: Do not attempt resuscitation (DNR) -DNR-LIMITED -Do Not Intubate/DNI  Family Communication: No family present @ bedside  Disposition Plan:  Level of care: ICU Status is:  Inpatient Remains inpatient appropriate because: Needs further clinical stabilization and clearance by the specialists.   Consultants:  Orthopedic Surgery PCCM  Procedures:  As delineated as above  Antimicrobials:  Anti-infectives (From admission, onward)    Start     Dose/Rate Route Frequency Ordered Stop   11/12/23 2200  ceFAZolin  (ANCEF ) IVPB 2g/100 mL premix        2 g 200 mL/hr over 30 Minutes Intravenous Every 6 hours 11/12/23 1735 11/13/23 0622   11/12/23 1800  trimethoprim (TRIMPEX) tablet 100 mg        100 mg Oral Every evening 11/12/23 0923     11/12/23 1400  ceFAZolin  (ANCEF ) IVPB 2g/100 mL premix        2 g 200 mL/hr over 30 Minutes Intravenous On call to O.R. 11/12/23 1357 11/12/23 1614       Subjective: Seen and examined at bedside I was getting her echo done.  States that her leg hurts whenever she moves it.  States that it does not hurt when she remains still.  Denied any nausea or vomiting.  No other concerns or complaints at this time.  Objective: Vitals:   11/13/23 1500 11/13/23 1530 11/13/23 1600 11/13/23 1630  BP: (!) 89/36 (!) 80/40 (!) 111/58 (!) 92/39  Pulse: 71 71 76 72  Resp: 19 20 (!) 25 18  Temp:   97.9 F (36.6 C)   TempSrc:   Oral   SpO2: 98% 98% 98% 98%  Weight:      Height:        Intake/Output Summary (Last 24 hours) at 11/13/2023 1837 Last data filed at 11/13/2023 1625 Gross per 24 hour  Intake 2599 ml  Output 850 ml  Net 1749 ml   Filed Weights   11/12/23 1411  Weight: 48.1 kg   Examination: Physical Exam:  Constitutional: Elderly female no acute distress appears calm Respiratory: Diminished to auscultation bilaterally, no wheezing, rales, rhonchi or crackles. Normal respiratory effort and patient is not tachypenic. No accessory muscle use.  Unlabored breathing Cardiovascular: RRR, no murmurs / rubs / gallops. S1 and S2 auscultated. No extremity edema.  Abdomen: Soft, non-tender, non-distended. Bowel sounds positive.  GU:  Deferred. Musculoskeletal: No clubbing / cyanosis of digits/nails. No joint deformity upper and lower extremities.  Skin: No rashes, lesions, ulcers on limited skin evaluation. No induration; Warm and dry.  Neurologic: CN 2-12 grossly intact with no focal deficits. Romberg sign cerebellar reflexes not assessed.  Psychiatric: Normal judgment and insight. Alert and oriented x 3. Normal mood and appropriate affect.   Data Reviewed: I have personally reviewed following labs and imaging studies  CBC: Recent Labs  Lab 11/11/23 2038 11/12/23 1259 11/12/23 2021 11/13/23 0632  WBC 14.4* 7.8 8.8 8.5  NEUTROABS 12.4*  --   --   --   HGB 12.0 8.2* 7.1* 10.0*  HCT 36.9 25.3* 22.8* 30.3*  MCV 97.6 100.4* 104.6* 94.7  PLT 179 162 108* 101*   Basic Metabolic Panel: Recent Labs  Lab 11/11/23 2038 11/12/23 1259 11/13/23 0632 11/13/23 0633  NA 138 139 138  --   K 3.6 3.8  3.0*  --   CL 112* 115* 114*  --   CO2 20* 17* 13*  --   GLUCOSE 141* 109* 117*  --   BUN 18 17 15   --   CREATININE 0.75 0.83 0.84  --   CALCIUM 7.6* 7.1* 6.8*  --   MG 2.1  --  1.6*  --   PHOS 3.1  --  3.0 3.0   GFR: Estimated Creatinine Clearance: 31.3 mL/min (by C-G formula based on SCr of 0.84 mg/dL). Liver Function Tests: Recent Labs  Lab 11/11/23 2038 11/12/23 1259 11/13/23 0632  AST 18 19  --   ALT 15 14  --   ALKPHOS 25* 20*  --   BILITOT 0.5 0.8  --   PROT 5.2* 4.5*  --   ALBUMIN 2.8* 2.4* 2.6*   No results for input(s): LIPASE, AMYLASE in the last 168 hours. No results for input(s): AMMONIA in the last 168 hours. Coagulation Profile: Recent Labs  Lab 11/11/23 2038  INR 1.0   Cardiac Enzymes: Recent Labs  Lab 11/11/23 2038  CKTOTAL 111   BNP (last 3 results) No results for input(s): PROBNP in the last 8760 hours. HbA1C: No results for input(s): HGBA1C in the last 72 hours. CBG: No results for input(s): GLUCAP in the last 168 hours. Lipid Profile: No results for input(s):  CHOL, HDL, LDLCALC, TRIG, CHOLHDL, LDLDIRECT in the last 72 hours. Thyroid Function Tests: Recent Labs    11/12/23 1259  TSH 0.903   Anemia Panel: Recent Labs    11/12/23 2021 11/13/23 0632  VITAMINB12  --  905  FOLATE  --  15.2  FERRITIN  --  135  TIBC  --  169*  IRON  --  99  RETICCTPCT 2.2  --    Sepsis Labs: Recent Labs  Lab 11/13/23 6578  LATICACIDVEN <0.3*    Recent Results (from the past 240 hours)  Urine Culture (for pregnant, neutropenic or urologic patients or patients with an indwelling urinary catheter)     Status: Abnormal (Preliminary result)   Collection Time: 11/12/23 12:12 PM   Specimen: Urine, Catheterized  Result Value Ref Range Status   Specimen Description   Final    URINE, CATHETERIZED Performed at [facility], [address] [place], [place] [address]    Special Requests   Final    NONE Performed at [facility], [address], [place], [place] [address]    Culture (A)  Final    10,000 COLONIES/mL ESCHERICHIA COLI SUSCEPTIBILITIES TO FOLLOW Performed at [facility], [address], [place], [place] [address]    Report Status PENDING  Incomplete  Culture, blood (Routine X 2) w Reflex to ID Panel     Status: None (Preliminary result)   Collection Time: 11/12/23 12:53 PM   Specimen: BLOOD  Result Value Ref Range Status   Specimen Description   Final    BLOOD BLOOD LEFT HAND ANAEROBIC BOTTLE ONLY Performed at [facility], [address] [place], [place] [address]    Special Requests   Final    BOTTLES DRAWN AEROBIC AND ANAEROBIC Blood Culture adequate volume Performed at [facility], [address], [place], [place] [address]    Culture   Final    NO GROWTH < 24 HOURS Performed at [facility], [address], [place], [place] [address]    Report Status PENDING  Incomplete  Culture, blood (Routine X 2) w  Reflex to ID Panel      Status: None (Preliminary result)   Collection Time: 11/12/23 12:59 PM   Specimen: BLOOD  Result Value Ref Range Status   Specimen Description   Final    BLOOD BLOOD LEFT ARM AEROBIC BOTTLE ONLY Performed at [facility], [address], [place], [place] [address]    Special Requests   Final    BOTTLES DRAWN AEROBIC AND ANAEROBIC Blood Culture adequate volume Performed at [facility], [address], [place], [place] [address]    Culture   Final    NO GROWTH < 24 HOURS Performed at [facility], [address], [place], [place] [address]    Report Status PENDING  Incomplete  MRSA Next Gen by PCR, Nasal     Status: None   Collection Time: 11/12/23 10:48 PM   Specimen: Nasal Mucosa; Nasal Swab  Result Value Ref Range Status   MRSA by PCR Next Gen NOT DETECTED NOT DETECTED Final    Comment: (NOTE) The GeneXpert MRSA Assay (FDA approved for NASAL specimens only), is one component of a comprehensive MRSA colonization surveillance program. It is not intended to diagnose MRSA infection nor to guide or monitor treatment for MRSA infections. Test performance is not FDA approved in patients less than 28 years old. Performed at [facility], [address], [place], [place] [address]     Radiology Studies: ECHOCARDIOGRAM COMPLETE Result Date: 11/13/2023    ECHOCARDIOGRAM REPORT   Patient Name:   Janet Cooper Date of Exam: 11/13/2023 Medical Rec #:  [id number]          Height:       60.0 in Accession #:    [id number]         Weight:       106.0 lb Date of Birth:  March 21, 1930          BSA:          1.425 m Patient Age:    89 years           BP:           114/59 mmHg Patient Gender: F                  HR:           75 bpm. Exam Location:  Inpatient Procedure: 2D Echo, Cardiac Doppler and Color Doppler (Both Spectral and Color            Flow Doppler were utilized during procedure). Indications:    Shock R57.9  History:         Patient has no prior history of Echocardiogram examinations.                 Risk Factors:Hypertension.  Sonographer:    Astrid Blamer Referring Phys: [id number] OMAR Macario Savin  Sonographer Comments: Image acquisition challenging due to respiratory motion. IMPRESSIONS  1. Left ventricular ejection fraction, by estimation, is 65 to 70%. The left ventricle has normal function. The left ventricle has no regional wall motion abnormalities. Left ventricular diastolic parameters are indeterminate.  2. Right ventricular systolic function is normal. The right ventricular size is not well visualized.  3. The mitral valve is grossly normal. Trivial mitral valve regurgitation. No evidence of mitral stenosis.  4. The aortic valve is grossly normal. There is mild calcification of the aortic valve. Aortic valve regurgitation is not visualized. No aortic stenosis is present. Comparison(s): No  prior Echocardiogram. Conclusion(s)/Recommendation(s): Normal biventricular function without evidence of hemodynamically significant valvular heart disease. FINDINGS  Left Ventricle: Left ventricular ejection fraction, by estimation, is 65 to 70%. The left ventricle has normal function. The left ventricle has no regional wall motion abnormalities. The left ventricular internal cavity size was normal in size. There is  no left ventricular hypertrophy. Left ventricular diastolic parameters are indeterminate. Right Ventricle: The right ventricular size is not well visualized. Right vetricular wall thickness was not well visualized. Right ventricular systolic function is normal. Left Atrium: Left atrial size was normal in size. Right Atrium: Right atrial size was normal in size. Pericardium: There is no evidence of pericardial effusion. Mitral Valve: The mitral valve is grossly normal. Trivial mitral valve regurgitation. No evidence of mitral valve stenosis. Tricuspid Valve: The tricuspid valve is grossly normal. Tricuspid valve regurgitation is  mild . No evidence of tricuspid stenosis. Aortic Valve: The aortic valve is grossly normal. There is mild calcification of the aortic valve. Aortic valve regurgitation is not visualized. No aortic stenosis is present. Aortic valve mean gradient measures 6.0 mmHg. Aortic valve peak gradient measures 10.4 mmHg. Aortic valve area, by VTI measures 2.00 cm. Pulmonic Valve: The pulmonic valve was not well visualized. Pulmonic valve regurgitation is not visualized. No evidence of pulmonic stenosis. Aorta: The aortic root is normal in size and structure. Venous: The inferior vena cava was not well visualized. IAS/Shunts: The atrial septum is grossly normal.  LEFT VENTRICLE PLAX 2D LVIDd:         4.20 cm   Diastology LVIDs:         2.20 cm   LV e' medial:    4.24 cm/s LV PW:         0.80 cm   LV E/e' medial:  21.9 LV IVS:        0.80 cm   LV e' lateral:   5.87 cm/s LVOT diam:     1.90 cm   LV E/e' lateral: 15.8 LV SV:         73 LV SV Index:   51 LVOT Area:     2.84 cm  RIGHT VENTRICLE RV S prime:     12.70 cm/s LEFT ATRIUM             Index        RIGHT ATRIUM           Index LA Vol (A2C):   41.2 ml 28.90 ml/m  RA Area:     17.40 cm LA Vol (A4C):   25.0 ml 17.54 ml/m  RA Volume:   42.80 ml  30.03 ml/m LA Biplane Vol: 33.6 ml 23.57 ml/m  AORTIC VALVE AV Area (Vmax):    2.27 cm AV Area (Vmean):   2.18 cm AV Area (VTI):     2.00 cm AV Vmax:           161.00 cm/s AV Vmean:          113.000 cm/s AV VTI:            0.363 m AV Peak Grad:      10.4 mmHg AV Mean Grad:      6.0 mmHg LVOT Vmax:         129.00 cm/s LVOT Vmean:        86.900 cm/s LVOT VTI:          0.256 m LVOT/AV VTI ratio: 0.71  AORTA Ao Root diam: 3.10 cm MITRAL VALVE  TRICUSPID VALVE MV Area (PHT): 2.93 cm     TR Peak grad:   25.4 mmHg MV E velocity: 93.00 cm/s   TR Vmax:        252.00 cm/s MV A velocity: 106.00 cm/s MV E/A ratio:  0.88         SHUNTS                             Systemic VTI:  0.26 m                             Systemic  Diam: 1.90 cm Sheryle Donning MD Electronically signed by Sheryle Donning MD Signature Date/Time: 11/13/2023/12:45:29 PM    Final    DG Pelvis Portable Result Date: 11/12/2023 CLINICAL DATA:  Status post hip arthroplasty. EXAM: PORTABLE PELVIS 1-2 VIEWS COMPARISON:  Preoperative radiograph FINDINGS: Right hip arthroplasty in expected alignment. Fracture involving the intertrochanteric femur with displacement of the lesser trochanter, seen on preoperative exam. Recent postsurgical change includes air and edema in the soft tissues. IMPRESSION: Right hip arthroplasty in expected alignment. Electronically Signed   By: Chadwick Colonel M.D.   On: 11/12/2023 18:26   DG HIP UNILAT WITH PELVIS 1V RIGHT Result Date: 11/12/2023 CLINICAL DATA:  Right total hip arthroplasty EXAM: DG HIP (WITH OR WITHOUT PELVIS) 2V RIGHT COMPARISON:  11/11/2023 FINDINGS: A total of four images were obtained depicting the right hip and pelvis. The first image is a fluoroscopic spot image demonstrating a displaced intertrochanteric fracture of the right hip. There is likely some osteoid along the margins. Lesser trochanteric fragment noted. The next images a frontal view of the lower pelvis. No additional fractures are seen. The third image depicts a right total hip prosthesis in place although the final head of the right femoral component is not currently visible. There could be a radiolucent sizing head in place. The neck of the femoral prosthesis appears satisfactory aligned with the acetabular shell. The final image is centered on the lower pelvis and likewise shows the components of the hip prosthesis in place. IMPRESSION: 1. Right total hip prosthesis in place, although the final head of the right femoral component is not currently visible on the final images. There could be a radiolucent sizing head in place. 2. Displaced intertrochanteric fracture of the right hip. Electronically Signed   By: Freida Jes M.D.   On:  11/12/2023 17:23   DG C-Arm 1-60 Min-No Report Result Date: 11/12/2023 Fluoroscopy was utilized by the requesting physician.  No radiographic interpretation.   DG Chest Port 1 View Result Date: 11/11/2023 CLINICAL DATA:  med clearance.  Right hip deformity/fracture EXAM: PORTABLE CHEST 1 VIEW COMPARISON:  None Available. FINDINGS: The heart and mediastinal contours are within normal limits. Atherosclerotic plaque. Left base atelectasis. No focal consolidation. No pulmonary edema. No pleural effusion. No pneumothorax. No acute osseous abnormality. IMPRESSION: 1. No active disease. 2.  Aortic Atherosclerosis (ICD10-I70.0). Electronically Signed   By: Morgane  Naveau M.D.   On: 11/11/2023 21:39   DG Hip Unilat With Pelvis 2-3 Views Right Result Date: 11/11/2023 CLINICAL DATA:  med clearance Pt arriving via [facility] for fall from second step. Right hip deformity, EXAM: DG HIP (WITH OR WITHOUT PELVIS) 2-3V RIGHT COMPARISON:  X-ray pelvis 03/13/2010 FINDINGS: Acute markedly superiorly and laterally displaced right intertrochanteric femoral fracture. No right hip dislocation. No acute displaced fracture or dislocation of the left  hip. No acute displaced fracture or dislocation of the left hip on frontal view. No acute displaced fracture or diastasis of the bones of the pelvis. There is no evidence of arthropathy or other focal bone abnormality. Degenerative changes of the lower lumbar spine. IMPRESSION: Acute markedly superolaterally displaced right intertrochanteric femoral fracture. Electronically Signed   By: Morgane  Naveau M.D.   On: 11/11/2023 21:37   Scheduled Meds:  acetaminophen   1,000 mg Oral Q6H   aspirin  81 mg Oral BID   Chlorhexidine Gluconate Cloth  6 each Topical Daily   dorzolamide-timolol  1 drop Both Eyes BID   famotidine  20 mg Oral Daily   feeding supplement  237 mL Oral BID BM   latanoprost  1 drop Both Eyes QHS   midodrine  10 mg Oral TID WC   multivitamin with minerals  1 tablet Oral  Daily   pantoprazole  40 mg Oral Daily   simvastatin  10 mg Oral q1800   trimethoprim  100 mg Oral QPM   Continuous Infusions:  sodium chloride      sodium chloride  Stopped (11/13/23 0200)   lactated ringers  75 mL/hr at 11/13/23 1625   sodium bicarbonate 150 mEq in dextrose  5 % 1,150 mL infusion      LOS: 2 days   Aura Leeds, DO [facility] Available via Epic secure chat 7am-7pm After these hours, please refer to coverage provider listed on [email or web address] 11/13/2023, 6:37 PM

## 2023-11-13 NOTE — Progress Notes (Signed)
 Initial Nutrition Assessment  DOCUMENTATION CODES:   Not applicable  INTERVENTION:  - Soft diet per MD.  - Ensure Plus High Protein po BID, each supplement provides 350 kcal and 20 grams of protein. - Magic cup BID with meals, each supplement provides 290 kcal and 9 grams of protein - Encourage intake at all meals and of supplements.  - Multivitamin with minerals daily - Monitor weight trends.   NUTRITION DIAGNOSIS:   Increased nutrient needs related to hip fracture as evidenced by estimated needs.  GOAL:   Patient will meet greater than or equal to 90% of their needs  MONITOR:   PO intake, Supplement acceptance, Weight trends  REASON FOR ASSESSMENT:   Consult Hip fracture protocol  ASSESSMENT:   89 year old woman with PMH significant for HTN, HLD, chronic cystitis/frequent UTIs, endometrial CA (stage 1B s/p TAH/BSO, s/p radiation) from 1990-1994 who presented after a fall w/ resultant superolaterally displaced R intertrochanteric femoral fracture.   RD working remotely. Attempted to call patient via bedside telephone but no answer.   Per EMR, only weight history within the past year is from November, when patient was weighed at 106#. Patient weighed at exactly the same this admission, question if weight was copied.  Patient advanced to a soft diet this AM. No meal intakes recorded at this time. Will order Ensure and Magic Cup to support intake.   Medications reviewed and include: Protonix  Labs reviewed:  K+ 3.0 Magnesium 1.6   NUTRITION - FOCUSED PHYSICAL EXAM:  Unable to obtain, RD working remotely  Diet Order:   Diet Order             DIET SOFT Room service appropriate? Yes; Fluid consistency: Thin  Diet effective now                   EDUCATION NEEDS:  No education needs have been identified at this time  Skin:  Skin Assessment: Skin Integrity Issues: Skin Integrity Issues:: Incisions Incisions: Right Hip  Last BM:  6/10  Height:  Ht  Readings from Last 1 Encounters:  11/12/23 5' (1.524 m)   Weight:  Wt Readings from Last 1 Encounters:  11/12/23 48.1 kg    BMI:  Body mass index is 20.71 kg/m.  Estimated Nutritional Needs:  Kcal:  1350-1550 kcals Protein:  65-75 grams Fluid:  >/= 1.5L    Scheryl Cushing RD, LDN Contact via Secure Chat.

## 2023-11-14 DIAGNOSIS — R579 Shock, unspecified: Secondary | ICD-10-CM | POA: Diagnosis not present

## 2023-11-14 DIAGNOSIS — S72001P Fracture of unspecified part of neck of right femur, subsequent encounter for closed fracture with malunion: Secondary | ICD-10-CM | POA: Diagnosis not present

## 2023-11-14 DIAGNOSIS — S72141A Displaced intertrochanteric fracture of right femur, initial encounter for closed fracture: Secondary | ICD-10-CM | POA: Diagnosis not present

## 2023-11-14 DIAGNOSIS — D649 Anemia, unspecified: Secondary | ICD-10-CM | POA: Diagnosis not present

## 2023-11-14 DIAGNOSIS — R7881 Bacteremia: Secondary | ICD-10-CM

## 2023-11-14 LAB — COMPREHENSIVE METABOLIC PANEL WITH GFR
ALT: 7 U/L (ref 0–44)
AST: 23 U/L (ref 15–41)
Albumin: 2.4 g/dL — ABNORMAL LOW (ref 3.5–5.0)
Alkaline Phosphatase: 16 U/L — ABNORMAL LOW (ref 38–126)
Anion gap: 6 (ref 5–15)
BUN: 18 mg/dL (ref 8–23)
CO2: 19 mmol/L — ABNORMAL LOW (ref 22–32)
Calcium: 7.1 mg/dL — ABNORMAL LOW (ref 8.9–10.3)
Chloride: 112 mmol/L — ABNORMAL HIGH (ref 98–111)
Creatinine, Ser: 0.76 mg/dL (ref 0.44–1.00)
GFR, Estimated: >60 mL/min (ref >60)
Glucose, Bld: 120 mg/dL — ABNORMAL HIGH (ref 70–99)
Potassium: 3.9 mmol/L (ref 3.5–5.1)
Sodium: 137 mmol/L (ref 135–145)
Total Bilirubin: 0.7 mg/dL (ref 0.0–1.2)
Total Protein: 3.8 g/dL — ABNORMAL LOW (ref 6.5–8.1)

## 2023-11-14 LAB — BLOOD CULTURE ID PANEL (REFLEXED) - BCID2

## 2023-11-14 LAB — URINE CULTURE: Culture: 10000 — AB

## 2023-11-14 LAB — CBC WITH DIFFERENTIAL/PLATELET
Abs Immature Granulocytes: 0.02 10*3/uL (ref 0.00–0.07)
Basophils Absolute: 0 10*3/uL (ref 0.0–0.1)
Basophils Relative: 0 %
Eosinophils Absolute: 0.2 10*3/uL (ref 0.0–0.5)
Eosinophils Relative: 3 %
HCT: 23.9 % — ABNORMAL LOW (ref 36.0–46.0)
Hemoglobin: 8 g/dL — ABNORMAL LOW (ref 12.0–15.0)
Immature Granulocytes: 0 %
Lymphocytes Relative: 19 %
Lymphs Abs: 1 10*3/uL (ref 0.7–4.0)
MCH: 31 pg (ref 26.0–34.0)
MCHC: 33.5 g/dL (ref 30.0–36.0)
MCV: 92.6 fL (ref 80.0–100.0)
Monocytes Absolute: 0.7 10*3/uL (ref 0.1–1.0)
Monocytes Relative: 12 %
Neutro Abs: 3.5 10*3/uL (ref 1.7–7.7)
Neutrophils Relative %: 66 %
Platelets: 84 10*3/uL — ABNORMAL LOW (ref 150–400)
RBC: 2.58 MIL/uL — ABNORMAL LOW (ref 3.87–5.11)
RDW: 16.8 % — ABNORMAL HIGH (ref 11.5–15.5)
WBC: 5.3 10*3/uL (ref 4.0–10.5)
nRBC: 0 % (ref 0.0–0.2)

## 2023-11-14 LAB — TYPE AND SCREEN
ABO/RH(D): O POS
Antibody Screen: NEGATIVE
Unit division: 0
Unit division: 0
Unit division: 0
Unit division: 0
Unit division: 0

## 2023-11-14 LAB — BPAM RBC
Blood Product Expiration Date: 202507102359
Blood Product Expiration Date: 202507102359
Blood Product Expiration Date: 202507112359
Blood Product Unit Number: 202507102359
Blood Product Unit Number: 202507112359
ISSUE DATE / TIME: 202506101650
ISSUE DATE / TIME: 202506102223
ISSUE DATE / TIME: 202506110158
Unit Type and Rh: 202507102359
Unit Type and Rh: 202507112359
Unit Type and Rh: 202507112359
Unit Type and Rh: 5100
Unit Type and Rh: 5100
Unit Type and Rh: 5100
Unit Type and Rh: 5100
Unit Type and Rh: 5100

## 2023-11-14 LAB — CULTURE, BLOOD (ROUTINE X 2): Special Requests: ADEQUATE

## 2023-11-14 LAB — MAGNESIUM: Magnesium: 2.2 mg/dL (ref 1.7–2.4)

## 2023-11-14 LAB — PHOSPHORUS: Phosphorus: 1.6 mg/dL — ABNORMAL LOW (ref 2.5–4.6)

## 2023-11-14 LAB — OCCULT BLOOD X 1 CARD TO LAB, STOOL: Fecal Occult Bld: POSITIVE — AB

## 2023-11-14 LAB — CORTISOL: Cortisol, Plasma: 5.8 ug/dL

## 2023-11-14 MED ORDER — SODIUM CHLORIDE 0.9 % IV SOLN
2.0000 g | Freq: Every day | INTRAVENOUS | Status: DC
  Administered 2023-11-14 – 2023-11-18 (×5): 2 g via INTRAVENOUS
  Filled 2023-11-14 (×5): qty 20

## 2023-11-14 MED ORDER — K PHOS MONO-SOD PHOS DI & MONO 155-852-130 MG PO TABS
500.0000 mg | ORAL_TABLET | Freq: Two times a day (BID) | ORAL | Status: AC
  Administered 2023-11-14 (×2): 500 mg via ORAL
  Filled 2023-11-14 (×2): qty 2

## 2023-11-14 MED ORDER — METRONIDAZOLE 500 MG PO TABS
500.0000 mg | ORAL_TABLET | Freq: Two times a day (BID) | ORAL | Status: DC
  Administered 2023-11-14 – 2023-11-18 (×9): 500 mg via ORAL
  Filled 2023-11-14 (×9): qty 1

## 2023-11-14 NOTE — Progress Notes (Signed)
 PROGRESS NOTE    Janet Cooper  ZOX:[id number] DOB: 1929/11/04 DOA: 11/11/2023 PCP: Lory Rough., PA-C   Brief Narrative:  The patient is an 89 year old female history of hypertension, hyperlipidemia presenting to the ED while was walking down 2 steps into her garage and landed on her right side with significant right hip pain. Imaging done consistent with right hip fracture.  She underwent surgical intervention but was also noted to be hypotensive.  Antihypertensives were held and she is typed and screened transfuse 1 unit PRBCs. Transferred to the ICU. CCM was consulted given her hypotension she had to be placed on low-dose Neo-Synephrine which has been weaned off.  Hemoglobin slightly dropped again.  She complains of not moving very much.  Also not feeling very well today but further workup reveals that she has a gram-negative rod bacteremia and continues to have significant nausea and diarrhea.  Will work her up with a GI pathogen panel and if this is negative we will give her antidiarrheals.  Assessment and Plan:  Right intertrochanteric femoral fracture Secondary to mechanical fall:  Patient noted to be hypotensive and have low blood pressure which she states is at baseline and on antihypertensive medications. Patient states over the past few days has been having feelings of motion sickness and just not feeling too well with some nausea. Patient seen in consultation by orthopedics and patient underwent Surgical Repair 11/12/23. PT/OT to evaluate and treat and this is pending    Hypotension with shock: Patient noted with systolic blood pressures in the 70s to 80s on presentation to the ED however patient alert oriented and answering questions appropriately.  Shock is likely multifactorial given her blood loss, pain meds, Bacteremia and antihypertensives; patient states on antihypertensive medications which she was told to continue per PCP despite low/soft blood pressure. Given IVF  bolus - IV albumin 25 g x 1 again. Had to be placed on Short term Pressors. IVF as below.  Discontinue antihypertensive medications and likely will not resume on discharge.  Started her on midodrine and will continue increased to 10 mg 3 times daily. -Checking ECHOCardiogram and showed an EF of 65-70% w/ no RWMA -Check a UA and not very impressive but Urine Cx showing 10,000 CFU of ESBL E Coli. Blood Cx as below. Patient noted with an anemia will transfuse 1 unit PRBCs. Will check Cortisol Level in the AM and was 5.8. PCCM was consulted but signed off the case.  -Continue monitor blood pressures per protocol and last blood pressure was improved at 116/52  GNR Bacteremia: 1/4 Bottles +. Will start IV Ceftriaxone and Flagyl. Nothing was positive on BCID but ? Anaerobe. CTM and repeat Blood Cx  Nausea and Diarrhea: Checked C Diff and Negative. Check GI Pathogen Panel. C/w Antiemetics and Supportive Care. IF necessary will obtain CT Abd/Pelvis w/o Contrast   Essential Hypertension: Hold antihypertensive medications secondary to problem #2. Patient noted to have low blood pressure on presentation, states BP has been low/soft with systolics in the 100s however was told to continue her antihypertensive medications per her PCP. Will discontinue antihypertensive medications and not resume on discharge.   Normocytic Anemia: -Patient noted to have a drop in hemoglobin of 8.2 from 12.0 on admission. Patient with no overt bleeding but FOBT is Positive, however her skin around her rectum is so broken down though; Per nurse her stool doesn't look like it has any blood in it but the skin around is bloody when she is cleaned. Underwent surgical  intervention.  Anemia panel done and showed an iron level of 99, UIBC of 78, TIBC 169, saturation ratios of 58%, ferritin of 135, folate 15.2 and vitamin B12 105.  Placed on IV PPI 40 mg every 24 and she was typed and screened and Transfused 1 unit of PRBCs due to presentation  with hypotension. CTM for S/Sx of Bleeding. Hgb/Hct Trend: Recent Labs  Lab 11/11/23 2038 11/12/23 1259 11/12/23 2021 11/13/23 0632 11/14/23 0324  HGB 12.0 8.2* 7.1* 10.0* 8.0*  HCT 36.9 25.3* 22.8* 30.3* 23.9*  MCV 97.6 100.4* 104.6* 94.7 92.6  -Repeat CBC in the AM   Thrombocytopenia: Platelet Count is now went from 162 -> 108 -> 101 -> 84. CTM for S/Sx of Bleeding; no overt bleeding noted. Repeat CBC in the AM   Metabolic Acidosis: Now has a CO2 of 19, AG of 6, Chloride Level of 112. Start IV Sodium Bicarbonate @ 75 mL/hr on 11/13/23 and will continue today.  CTM and Trend and repeat CMP in the AM   Hyperlipidemia: C/w Simvastatin 10 mg po Daily  Hypokalemia: K+ is now 3.9. Replete with po K Phos Neutral 500 mg BID x2. CTM and Replete as Necessary. Repeat CMP in the AM  Hypophosphatemia: Phos Level is now 1.6. Replete with po K Phos Neutral 500 mg po BID x2. CTM and Replete as Necessary.   Hypomagnesemia: Mag Level was 1.6 and improved to 2.2. CTM and Replete as Necessary. Repeat Mag Level in the AM  GERD/GI prophylaxis: PCCM has started her on famotidine 20 mg po Daily   Hypoalbuminemia: Patient's Albumin Level is now 2.4. CTM and Trend and repeat CMP in the AM   DVT prophylaxis: SCDs Start: 11/11/23 2346    Code Status: Limited: Do not attempt resuscitation (DNR) -DNR-LIMITED -Do Not Intubate/DNI  Family Communication: No family currently at bedside  Disposition Plan:  Level of care: Progressive Status is: Inpatient Remains inpatient appropriate because: Pending further evaluation by PT and OT and improvement in her clinical status   Consultants:  PCCM/pulmonary Orthopedic surgery  Procedures:  As delineated as above  Antimicrobials:  Anti-infectives (From admission, onward)    Start     Dose/Rate Route Frequency Ordered Stop   11/14/23 1000  metroNIDAZOLE (FLAGYL) tablet 500 mg        500 mg Oral Every 12 hours 11/14/23 0833     11/14/23 0900  cefTRIAXone  (ROCEPHIN) 2 g in sodium chloride  0.9 % 100 mL IVPB        2 g 200 mL/hr over 30 Minutes Intravenous Daily 11/14/23 0815     11/12/23 2200  ceFAZolin  (ANCEF ) IVPB 2g/100 mL premix        2 g 200 mL/hr over 30 Minutes Intravenous Every 6 hours 11/12/23 1735 11/13/23 0622   11/12/23 1800  trimethoprim (TRIMPEX) tablet 100 mg        100 mg Oral Every evening 11/12/23 0923     11/12/23 1400  ceFAZolin  (ANCEF ) IVPB 2g/100 mL premix        2 g 200 mL/hr over 30 Minutes Intravenous On call to O.R. 11/12/23 1357 11/12/23 1614       Subjective: Seen and examined at bedside and continues to be extremely weak and states that she is afraid to move her leg.  States that she is not really able to stand and continues to have significant diarrhea.  No nausea or vomiting earlier but now having some nausea this afternoon.  Objective: Vitals:   11/14/23  1500 11/14/23 1600 11/14/23 1700 11/14/23 1800  BP: 131/64 130/69 128/61 (!) 116/52  Pulse: 66 68 73 60  Resp: 20 19 19 17   Temp:  98 F (36.7 C)    TempSrc:  Oral    SpO2: 99% 95% 99% 100%  Weight:      Height:        Intake/Output Summary (Last 24 hours) at 11/14/2023 1824 Last data filed at 11/14/2023 1201 Gross per 24 hour  Intake 1511.76 ml  Output 1000 ml  Net 511.76 ml   Filed Weights   11/12/23 1411  Weight: 48.1 kg   Examination: Physical Exam:  Constitutional: Elderly chronically ill-appearing female who appears fatigued and uncomfortable Respiratory: Diminished to auscultation bilaterally with some coarse breath sounds, no wheezing, rales, rhonchi or crackles. Normal respiratory effort and patient is not tachypenic. No accessory muscle use.  Unlabored breathing Cardiovascular: RRR, no murmurs / rubs / gallops. S1 and S2 auscultated. No extremity edema..  Abdomen: Soft, mildly-tender, non-distended. Bowel sounds positive.  GU: Deferred. Musculoskeletal: No clubbing / cyanosis of digits/nails. No joint deformity upper and lower  extremities.  Skin: No rashes, lesions, ulcers on limited skin evaluation. No induration; Warm and dry.  Neurologic: CN 2-12 grossly intact with no focal deficits. Romberg sign cerebellar reflexes not assessed.  Psychiatric: She is awake and alert  Data Reviewed: I have personally reviewed following labs and imaging studies  CBC: Recent Labs  Lab 11/11/23 2038 11/12/23 1259 11/12/23 2021 11/13/23 0632 11/14/23 0324  WBC 14.4* 7.8 8.8 8.5 5.3  NEUTROABS 12.4*  --   --   --  3.5  HGB 12.0 8.2* 7.1* 10.0* 8.0*  HCT 36.9 25.3* 22.8* 30.3* 23.9*  MCV 97.6 100.4* 104.6* 94.7 92.6  PLT 179 162 108* 101* 84*   Basic Metabolic Panel: Recent Labs  Lab 11/11/23 2038 11/12/23 1259 11/13/23 0632 11/13/23 0633 11/14/23 0324  NA 138 139 138  --  137  K 3.6 3.8 3.0*  --  3.9  CL 112* 115* 114*  --  112*  CO2 20* 17* 13*  --  19*  GLUCOSE 141* 109* 117*  --  120*  BUN 18 17 15   --  18  CREATININE 0.75 0.83 0.84  --  0.76  CALCIUM 7.6* 7.1* 6.8*  --  7.1*  MG 2.1  --  1.6*  --  2.2  PHOS 3.1  --  3.0 3.0 1.6*   GFR: Estimated Creatinine Clearance: 32.9 mL/min (by C-G formula based on SCr of 0.76 mg/dL). Liver Function Tests: Recent Labs  Lab 11/11/23 2038 11/12/23 1259 11/13/23 0632 11/14/23 0324  AST 18 19  --  23  ALT 15 14  --  7  ALKPHOS 25* 20*  --  16*  BILITOT 0.5 0.8  --  0.7  PROT 5.2* 4.5*  --  3.8*  ALBUMIN 2.8* 2.4* 2.6* 2.4*   No results for input(s): LIPASE, AMYLASE in the last 168 hours. No results for input(s): AMMONIA in the last 168 hours. Coagulation Profile: Recent Labs  Lab 11/11/23 2038  INR 1.0   Cardiac Enzymes: Recent Labs  Lab 11/11/23 2038  CKTOTAL 111   BNP (last 3 results) No results for input(s): PROBNP in the last 8760 hours. HbA1C: No results for input(s): HGBA1C in the last 72 hours. CBG: No results for input(s): GLUCAP in the last 168 hours. Lipid Profile: No results for input(s): CHOL, HDL, LDLCALC,  TRIG, CHOLHDL, LDLDIRECT in the last 72 hours. Thyroid Function  Tests: Recent Labs    11/12/23 1259  TSH 0.903   Anemia Panel: Recent Labs    11/12/23 2021 11/13/23 0632  VITAMINB12  --  905  FOLATE  --  15.2  FERRITIN  --  135  TIBC  --  169*  IRON  --  99  RETICCTPCT 2.2  --    Sepsis Labs: Recent Labs  Lab 11/13/23 1610  LATICACIDVEN <0.3*   Recent Results (from the past 240 hours)  Urine Culture (for pregnant, neutropenic or urologic patients or patients with an indwelling urinary catheter)     Status: Abnormal   Collection Time: 11/12/23 12:12 PM   Specimen: Urine, Catheterized  Result Value Ref Range Status   Specimen Description   Final    URINE, CATHETERIZED Performed at [facility], [address], [place], [place] [address]    Special Requests   Final    NONE Performed at [facility], [address] [place], [place] [address]    Culture (A)  Final    10,000 COLONIES/mL ESCHERICHIA COLI Confirmed Extended Spectrum Beta-Lactamase Producer (ESBL).  In bloodstream infections from ESBL organisms, carbapenems are preferred over piperacillin/tazobactam. They are shown to have a lower risk of mortality.    Report Status 11/14/2023 FINAL  Final   Organism ID, Bacteria ESCHERICHIA COLI (A)  Final      Susceptibility   Escherichia coli - MIC*    AMPICILLIN >=32 RESISTANT Resistant     CEFAZOLIN  >=64 RESISTANT Resistant     CEFEPIME 16 RESISTANT Resistant     CEFTRIAXONE >=64 RESISTANT Resistant     CIPROFLOXACIN  >=4 RESISTANT Resistant     GENTAMICIN <=1 SENSITIVE Sensitive     IMIPENEM <=0.25 SENSITIVE Sensitive     NITROFURANTOIN <=16 SENSITIVE Sensitive     TRIMETH/SULFA 80 RESISTANT Resistant     AMPICILLIN/SULBACTAM >=32 RESISTANT Resistant     PIP/TAZO <=4 SENSITIVE Sensitive ug/mL    * 10,000 COLONIES/mL ESCHERICHIA COLI  Culture, blood (Routine X 2) w Reflex to ID Panel     Status: None (Preliminary  result)   Collection Time: 11/12/23 12:53 PM   Specimen: BLOOD LEFT HAND  Result Value Ref Range Status   Specimen Description   Final    BLOOD LEFT HAND Performed at [facility], [address], [place], [place] [address]    Special Requests   Final    BOTTLES DRAWN AEROBIC AND ANAEROBIC Blood Culture adequate volume Performed at [facility], [address] [place], [place] [address]    Culture   Final    NO GROWTH 2 DAYS Performed at [facility], [address] [place], [place] [address]    Report Status PENDING  Incomplete  Culture, blood (Routine X 2) w Reflex to ID Panel     Status: None (Preliminary result)   Collection Time: 11/12/23 12:59 PM   Specimen: BLOOD  Result Value Ref Range Status   Specimen Description   Final    BLOOD Performed at [facility], [address] [place], [place] [address]    Special Requests   Final    BOTTLES DRAWN AEROBIC AND ANAEROBIC Blood Culture adequate volume Performed at [facility], [address] [place], [place] [address]    Culture  Setup Time   Final    GRAM NEGATIVE RODS ANAEROBIC BOTTLE ONLY PHARMD E JACKSON 11/14/2023 @ 0221 BY AB Performed at [facility],  [address] [place], [place] [address]    Culture GRAM NEGATIVE RODS  Final   Report Status PENDING  Incomplete  Blood Culture ID Panel (Reflexed)     Status: None   Collection Time: 11/12/23 12:59 PM  Result Value Ref Range Status   Enterococcus faecalis NOT DETECTED NOT DETECTED Final   Enterococcus Faecium NOT DETECTED NOT DETECTED Final   Listeria monocytogenes NOT DETECTED NOT DETECTED Final   Staphylococcus species NOT DETECTED NOT DETECTED Final   Staphylococcus aureus (BCID) NOT DETECTED NOT DETECTED Final   Staphylococcus epidermidis NOT DETECTED NOT DETECTED Final   Staphylococcus lugdunensis NOT DETECTED NOT DETECTED Final   Streptococcus species NOT DETECTED NOT DETECTED Final    Streptococcus agalactiae NOT DETECTED NOT DETECTED Final   Streptococcus pneumoniae NOT DETECTED NOT DETECTED Final   Streptococcus pyogenes NOT DETECTED NOT DETECTED Final   A.calcoaceticus-baumannii NOT DETECTED NOT DETECTED Final   Bacteroides fragilis NOT DETECTED NOT DETECTED Final   Enterobacterales NOT DETECTED NOT DETECTED Final   Enterobacter cloacae complex NOT DETECTED NOT DETECTED Final   Escherichia coli NOT DETECTED NOT DETECTED Final   Klebsiella aerogenes NOT DETECTED NOT DETECTED Final   Klebsiella oxytoca NOT DETECTED NOT DETECTED Final   Klebsiella pneumoniae NOT DETECTED NOT DETECTED Final   Proteus species NOT DETECTED NOT DETECTED Final   Salmonella species NOT DETECTED NOT DETECTED Final   Serratia marcescens NOT DETECTED NOT DETECTED Final   Haemophilus influenzae NOT DETECTED NOT DETECTED Final   Neisseria meningitidis NOT DETECTED NOT DETECTED Final   Pseudomonas aeruginosa NOT DETECTED NOT DETECTED Final   Stenotrophomonas maltophilia NOT DETECTED NOT DETECTED Final   Candida albicans NOT DETECTED NOT DETECTED Final   Candida auris NOT DETECTED NOT DETECTED Final   Candida glabrata NOT DETECTED NOT DETECTED Final   Candida krusei NOT DETECTED NOT DETECTED Final   Candida parapsilosis NOT DETECTED NOT DETECTED Final   Candida tropicalis NOT DETECTED NOT DETECTED Final   Cryptococcus neoformans/gattii NOT DETECTED NOT DETECTED Final    Comment: Performed at [facility], [address] [place], [place] [address]  MRSA Next Gen by PCR, Nasal     Status: None   Collection Time: 11/12/23 10:48 PM   Specimen: Nasal Mucosa; Nasal Swab  Result Value Ref Range Status   MRSA by PCR Next Gen NOT DETECTED NOT DETECTED Final    Comment: (NOTE) The GeneXpert MRSA Assay (FDA approved for NASAL specimens only), is one component of a comprehensive MRSA colonization surveillance program. It is not intended to diagnose MRSA infection nor to guide or monitor treatment  for MRSA infections. Test performance is not FDA approved in patients less than 65 years old. Performed at [facility], [address], [place], [place] [address]   C Difficile Quick Screen w PCR reflex     Status: None   Collection Time: 11/13/23  7:50 PM   Specimen: STOOL  Result Value Ref Range Status   C Diff antigen NEGATIVE NEGATIVE Final   C Diff toxin NEGATIVE NEGATIVE Final   C Diff interpretation No C. difficile detected.  Final    Comment: Performed at [facility], [address], [place], [place] [address]     Radiology Studies: ECHOCARDIOGRAM COMPLETE Result Date: 11/13/2023    ECHOCARDIOGRAM REPORT   Patient Name:   Janet Cooper Date of Exam: 11/13/2023 Medical Rec #:  [id number]          Height:  60.0 in Accession #:    [id number]         Weight:       106.0 lb Date of Birth:  07-10-1929          BSA:          1.425 m Patient Age:    89 years           BP:           114/59 mmHg Patient Gender: F                  HR:           75 bpm. Exam Location:  Inpatient Procedure: 2D Echo, Cardiac Doppler and Color Doppler (Both Spectral and Color            Flow Doppler were utilized during procedure). Indications:    Shock R57.9  History:        Patient has no prior history of Echocardiogram examinations.                 Risk Factors:Hypertension.  Sonographer:    Astrid Blamer Referring Phys: [id number] OMAR Macario Savin  Sonographer Comments: Image acquisition challenging due to respiratory motion. IMPRESSIONS  1. Left ventricular ejection fraction, by estimation, is 65 to 70%. The left ventricle has normal function. The left ventricle has no regional wall motion abnormalities. Left ventricular diastolic parameters are indeterminate.  2. Right ventricular systolic function is normal. The right ventricular size is not well visualized.  3. The mitral valve is grossly normal. Trivial mitral valve regurgitation. No evidence of mitral stenosis.  4. The  aortic valve is grossly normal. There is mild calcification of the aortic valve. Aortic valve regurgitation is not visualized. No aortic stenosis is present. Comparison(s): No prior Echocardiogram. Conclusion(s)/Recommendation(s): Normal biventricular function without evidence of hemodynamically significant valvular heart disease. FINDINGS  Left Ventricle: Left ventricular ejection fraction, by estimation, is 65 to 70%. The left ventricle has normal function. The left ventricle has no regional wall motion abnormalities. The left ventricular internal cavity size was normal in size. There is  no left ventricular hypertrophy. Left ventricular diastolic parameters are indeterminate. Right Ventricle: The right ventricular size is not well visualized. Right vetricular wall thickness was not well visualized. Right ventricular systolic function is normal. Left Atrium: Left atrial size was normal in size. Right Atrium: Right atrial size was normal in size. Pericardium: There is no evidence of pericardial effusion. Mitral Valve: The mitral valve is grossly normal. Trivial mitral valve regurgitation. No evidence of mitral valve stenosis. Tricuspid Valve: The tricuspid valve is grossly normal. Tricuspid valve regurgitation is mild . No evidence of tricuspid stenosis. Aortic Valve: The aortic valve is grossly normal. There is mild calcification of the aortic valve. Aortic valve regurgitation is not visualized. No aortic stenosis is present. Aortic valve mean gradient measures 6.0 mmHg. Aortic valve peak gradient measures 10.4 mmHg. Aortic valve area, by VTI measures 2.00 cm. Pulmonic Valve: The pulmonic valve was not well visualized. Pulmonic valve regurgitation is not visualized. No evidence of pulmonic stenosis. Aorta: The aortic root is normal in size and structure. Venous: The inferior vena cava was not well visualized. IAS/Shunts: The atrial septum is grossly normal.  LEFT VENTRICLE PLAX 2D LVIDd:         4.20 cm    Diastology LVIDs:         2.20 cm   LV e' medial:    4.24 cm/s LV PW:  0.80 cm   LV E/e' medial:  21.9 LV IVS:        0.80 cm   LV e' lateral:   5.87 cm/s LVOT diam:     1.90 cm   LV E/e' lateral: 15.8 LV SV:         73 LV SV Index:   51 LVOT Area:     2.84 cm  RIGHT VENTRICLE RV S prime:     12.70 cm/s LEFT ATRIUM             Index        RIGHT ATRIUM           Index LA Vol (A2C):   41.2 ml 28.90 ml/m  RA Area:     17.40 cm LA Vol (A4C):   25.0 ml 17.54 ml/m  RA Volume:   42.80 ml  30.03 ml/m LA Biplane Vol: 33.6 ml 23.57 ml/m  AORTIC VALVE AV Area (Vmax):    2.27 cm AV Area (Vmean):   2.18 cm AV Area (VTI):     2.00 cm AV Vmax:           161.00 cm/s AV Vmean:          113.000 cm/s AV VTI:            0.363 m AV Peak Grad:      10.4 mmHg AV Mean Grad:      6.0 mmHg LVOT Vmax:         129.00 cm/s LVOT Vmean:        86.900 cm/s LVOT VTI:          0.256 m LVOT/AV VTI ratio: 0.71  AORTA Ao Root diam: 3.10 cm MITRAL VALVE                TRICUSPID VALVE MV Area (PHT): 2.93 cm     TR Peak grad:   25.4 mmHg MV E velocity: 93.00 cm/s   TR Vmax:        252.00 cm/s MV A velocity: 106.00 cm/s MV E/A ratio:  0.88         SHUNTS                             Systemic VTI:  0.26 m                             Systemic Diam: 1.90 cm Sheryle Donning MD Electronically signed by Sheryle Donning MD Signature Date/Time: 11/13/2023/12:45:29 PM    Final    Scheduled Meds:  acetaminophen   1,000 mg Oral Q6H   aspirin  81 mg Oral BID   Chlorhexidine Gluconate Cloth  6 each Topical Daily   dorzolamide-timolol  1 drop Both Eyes BID   famotidine  20 mg Oral Daily   feeding supplement  237 mL Oral BID BM   latanoprost  1 drop Both Eyes QHS   metroNIDAZOLE  500 mg Oral Q12H   midodrine  10 mg Oral TID WC   multivitamin with minerals  1 tablet Oral Daily   pantoprazole  40 mg Oral Daily   phosphorus  500 mg Oral BID   simvastatin  10 mg Oral q1800   trimethoprim  100 mg Oral QPM   Continuous Infusions:   sodium chloride      cefTRIAXone (ROCEPHIN)  IV Stopped (11/14/23 1054)   sodium bicarbonate 150 mEq in  dextrose  5 % 1,150 mL infusion 75 mL/hr at 11/14/23 1211    LOS: 3 days   Aura Leeds, DO [facility] Available via Epic secure chat 7am-7pm After these hours, please refer to coverage provider listed on [email or web address] 11/14/2023, 6:24 PM

## 2023-11-14 NOTE — Plan of Care (Signed)

## 2023-11-14 NOTE — Progress Notes (Signed)
   Subjective: 2 Days Post-Op Procedure(s) (LRB): ARTHROPLASTY, HIP, TOTAL, ANTERIOR APPROACH (Right) Patient reports pain as mild.   Patient seen in rounds for Dr. Bernard Brick. Patient is resting in bed on exam this morning. She has not been out of bed yet. She reports not feeling well today, similar to yesterday.   Objective: Vital signs in last 24 hours: Temp:  [97.8 F (36.6 C)-98.2 F (36.8 C)] 97.9 F (36.6 C) (06/12 1200) Pulse Rate:  [65-93] 73 (06/12 1200) Resp:  [14-32] 17 (06/12 1200) BP: (80-137)/(36-75) 103/68 (06/12 1200) SpO2:  [92 %-100 %] 98 % (06/12 1200)  Intake/Output from previous day:  Intake/Output Summary (Last 24 hours) at 11/14/2023 1321 Last data filed at 11/14/2023 1201 Gross per 24 hour  Intake 2201.7 ml  Output 1000 ml  Net 1201.7 ml     Intake/Output this shift: Total I/O In: 472.5 [I.V.:372.5; IV Piggyback:100] Out: 400 [Urine:400]  Labs: Recent Labs    11/11/23 2038 11/12/23 1259 11/12/23 2021 11/13/23 0632 11/14/23 0324  HGB 12.0 8.2* 7.1* 10.0* 8.0*   Recent Labs    11/13/23 0632 11/14/23 0324  WBC 8.5 5.3  RBC 3.20* 2.58*  HCT 30.3* 23.9*  PLT 101* 84*   Recent Labs    11/13/23 0632 11/14/23 0324  NA 138 137  K 3.0* 3.9  CL 114* 112*  CO2 13* 19*  BUN 15 18  CREATININE 0.84 0.76  GLUCOSE 117* 120*  CALCIUM 6.8* 7.1*   Recent Labs    11/11/23 2038  INR 1.0    Exam: General - Patient is Alert and Oriented Extremity - Neurologically intact Sensation intact distally Intact pulses distally Dorsiflexion/Plantar flexion intact Dressing - dressing C/D/I Motor Function - intact, moving foot and toes well on exam.   Past Medical History:  Diagnosis Date   Arthritis    back   Chronic cystitis    Detrusor instability of bladder    Fecal incontinence    Glaucoma of both eyes    History of endometrial cancer    1990 dx  Stage IB, Grade 1 endometrial carcinoma  s/p TAH w/ BSO--  recurrence 1994 vaginal cuff  s/p  radiation   History of radiation therapy    1994  lower pelvic area for recurrent endometrial cancer at vaginal cuff   Hyperlipidemia    Hypertension    OAB (overactive bladder)    Osteopenia 05/2013   T score -2.1 FRAX 8.9%/2.5%   Urge urinary incontinence    Wears glasses     Assessment/Plan: 2 Days Post-Op Procedure(s) (LRB): ARTHROPLASTY, HIP, TOTAL, ANTERIOR APPROACH (Right) Principal Problem:   Closed right hip fracture (HCC) Active Problems:   Hyperlipidemia   Hypertension   S/P total right hip arthroplasty   Anemia   Hypotension   Intertrochanteric fracture of right hip, closed, initial encounter (HCC)   Shock (HCC)  Estimated body mass index is 20.71 kg/m as calculated from the following:   Height as of this encounter: 5' (1.524 m).   Weight as of this encounter: 48.1 kg.   Hgb 8.0 today  Up with PT as able She is allowed to move any way needed She mentioned concerns about her getting on/off bedpan or commode regarding her hip, but this is perfectly fine No precautions for her hip    Kim Pen, PA-C Orthopedic Surgery [phone] 11/14/2023, 1:21 PM

## 2023-11-14 NOTE — Progress Notes (Signed)
 PHARMACY - PHYSICIAN COMMUNICATION CRITICAL VALUE ALERT - BLOOD CULTURE IDENTIFICATION (BCID)  Janet Cooper is an 89 y.o. female who presented to [facility] on 11/11/2023 with a chief complaint of hip fracture  Assessment: 1/4 GNR  Name of physician (or Provider) Contacted: Lorre Rosin  Current antibiotics: none  Changes to prescribed antibiotics recommended:  None, probable contaminant  No results found for this or any previous visit.  Beau Bound RPh 11/14/2023, 2:38 AM

## 2023-11-14 NOTE — TOC Progression Note (Signed)
 Transition of Care [facility]) - Progression Note    Patient Details  Name: Janet Cooper MRN: [id number] Date of Birth: September 26, 1929  [facility]) CM/SW Contact  Tessie Fila, RN Phone Number: 11/14/2023, 11:16 AM  Clinical Narrative:    Pt remains in ICU level of care. TOC will continue to follow for DC planning needs.   Expected Discharge Plan: Home w Home Health Services Barriers to Discharge: Continued Medical Work up  Expected Discharge Plan and Services                                              Social Determinants of Health (SDOH) Interventions SDOH Screenings   Food Insecurity: Patient Unable To Answer (11/13/2023)  Housing: Patient Unable To Answer (11/13/2023)  Utilities: Patient Unable To Answer (11/13/2023)  Social Connections: Patient Unable To Answer (11/13/2023)  Tobacco Use: Low Risk  (11/12/2023)    Readmission Risk Interventions    11/14/2023   11:13 AM  Readmission Risk Prevention Plan  Post Dischage Appt Complete  Medication Screening Complete  Transportation Screening Complete

## 2023-11-15 DIAGNOSIS — S72001P Fracture of unspecified part of neck of right femur, subsequent encounter for closed fracture with malunion: Secondary | ICD-10-CM | POA: Diagnosis not present

## 2023-11-15 DIAGNOSIS — D649 Anemia, unspecified: Secondary | ICD-10-CM | POA: Diagnosis not present

## 2023-11-15 DIAGNOSIS — R197 Diarrhea, unspecified: Secondary | ICD-10-CM

## 2023-11-15 DIAGNOSIS — S72141A Displaced intertrochanteric fracture of right femur, initial encounter for closed fracture: Secondary | ICD-10-CM | POA: Diagnosis not present

## 2023-11-15 DIAGNOSIS — E785 Hyperlipidemia, unspecified: Secondary | ICD-10-CM | POA: Diagnosis not present

## 2023-11-15 LAB — GASTROINTESTINAL PANEL BY PCR, STOOL (REPLACES STOOL CULTURE)

## 2023-11-15 LAB — COMPREHENSIVE METABOLIC PANEL WITH GFR
ALT: 8 U/L (ref 0–44)
AST: 26 U/L (ref 15–41)
Albumin: 2.3 g/dL — ABNORMAL LOW (ref 3.5–5.0)
Alkaline Phosphatase: 18 U/L — ABNORMAL LOW (ref 38–126)
Anion gap: 6 (ref 5–15)
BUN: 12 mg/dL (ref 8–23)
CO2: 26 mmol/L (ref 22–32)
Calcium: 6.6 mg/dL — ABNORMAL LOW (ref 8.9–10.3)
Chloride: 103 mmol/L (ref 98–111)
Creatinine, Ser: 0.61 mg/dL (ref 0.44–1.00)
GFR, Estimated: >60 mL/min (ref >60)
Glucose, Bld: 109 mg/dL — ABNORMAL HIGH (ref 70–99)
Potassium: 3.1 mmol/L — ABNORMAL LOW (ref 3.5–5.1)
Sodium: 135 mmol/L (ref 135–145)
Total Bilirubin: 0.8 mg/dL (ref 0.0–1.2)
Total Protein: 3.9 g/dL — ABNORMAL LOW (ref 6.5–8.1)

## 2023-11-15 LAB — CBC WITH DIFFERENTIAL/PLATELET
Abs Immature Granulocytes: 0.02 10*3/uL (ref 0.00–0.07)
Basophils Absolute: 0 10*3/uL (ref 0.0–0.1)
Basophils Relative: 1 %
Eosinophils Absolute: 0.3 10*3/uL (ref 0.0–0.5)
Eosinophils Relative: 5 %
HCT: 25 % — ABNORMAL LOW (ref 36.0–46.0)
Hemoglobin: 8.3 g/dL — ABNORMAL LOW (ref 12.0–15.0)
Immature Granulocytes: 0 %
Lymphocytes Relative: 20 %
Lymphs Abs: 1.1 10*3/uL (ref 0.7–4.0)
MCH: 31.2 pg (ref 26.0–34.0)
MCHC: 33.2 g/dL (ref 30.0–36.0)
MCV: 94 fL (ref 80.0–100.0)
Monocytes Absolute: 0.6 10*3/uL (ref 0.1–1.0)
Monocytes Relative: 10 %
Neutro Abs: 3.4 10*3/uL (ref 1.7–7.7)
Neutrophils Relative %: 64 %
Platelets: 100 10*3/uL — ABNORMAL LOW (ref 150–400)
RBC: 2.66 MIL/uL — ABNORMAL LOW (ref 3.87–5.11)
RDW: 16.7 % — ABNORMAL HIGH (ref 11.5–15.5)
WBC: 5.4 10*3/uL (ref 4.0–10.5)
nRBC: 0 % (ref 0.0–0.2)

## 2023-11-15 LAB — PHOSPHORUS: Phosphorus: 2.6 mg/dL (ref 2.5–4.6)

## 2023-11-15 LAB — MAGNESIUM: Magnesium: 1.9 mg/dL (ref 1.7–2.4)

## 2023-11-15 MED ORDER — POTASSIUM CHLORIDE CRYS ER 20 MEQ PO TBCR
40.0000 meq | EXTENDED_RELEASE_TABLET | Freq: Once | ORAL | Status: AC
  Administered 2023-11-15: 40 meq via ORAL
  Filled 2023-11-15: qty 2

## 2023-11-15 MED ORDER — POTASSIUM CHLORIDE CRYS ER 20 MEQ PO TBCR
40.0000 meq | EXTENDED_RELEASE_TABLET | Freq: Two times a day (BID) | ORAL | Status: AC
  Administered 2023-11-15 (×2): 40 meq via ORAL
  Filled 2023-11-15 (×2): qty 2

## 2023-11-15 MED ORDER — CALCIUM GLUCONATE-NACL 1-0.675 GM/50ML-% IV SOLN
1.0000 g | Freq: Once | INTRAVENOUS | Status: AC
  Administered 2023-11-15: 1000 mg via INTRAVENOUS
  Filled 2023-11-15: qty 50

## 2023-11-15 MED ORDER — ALPRAZOLAM 0.25 MG PO TABS: 0.2500 mg | ORAL_TABLET | Freq: Two times a day (BID) | ORAL | Status: DC | PRN

## 2023-11-15 MED ORDER — HYDROXYZINE HCL 25 MG PO TABS
25.0000 mg | ORAL_TABLET | Freq: Three times a day (TID) | ORAL | Status: DC | PRN
  Administered 2023-11-15 – 2023-11-18 (×4): 25 mg via ORAL
  Filled 2023-11-15 (×5): qty 1

## 2023-11-15 MED ORDER — LOPERAMIDE HCL 2 MG PO CAPS
2.0000 mg | ORAL_CAPSULE | ORAL | Status: DC | PRN
  Administered 2023-11-15 – 2023-11-18 (×4): 2 mg via ORAL
  Filled 2023-11-15 (×4): qty 1

## 2023-11-15 NOTE — Plan of Care (Signed)

## 2023-11-15 NOTE — Progress Notes (Signed)
 PROGRESS NOTE    Janet Cooper  ZOX:[id number] DOB: [birth year] DOA: 11/11/2023 PCP: Lory Rough., PA-C   Brief Narrative:  The patient is an 89 year old female history of hypertension, hyperlipidemia presenting to the ED while was walking down 2 steps into her garage and landed on her right side with significant right hip pain. Imaging done consistent with right hip fracture.  She underwent surgical intervention but was also noted to be hypotensive.  Antihypertensives were held and she is typed and screened transfuse 1 unit PRBCs. Transferred to the ICU. CCM was consulted given her hypotension she had to be placed on low-dose Neo-Synephrine which has been weaned off.  Hemoglobin is stable now. PT/OT recommending CIR. GI Pathogen Panel Negative so will add Antidiarrheals. BP improving and will CTM.  Assessment and Plan:  Right intertrochanteric femoral fracture Secondary to mechanical fall:  Patient noted to be hypotensive and have low blood pressure which she states is at baseline and on antihypertensive medications. Patient states over the past few days has been having feelings of motion sickness and just not feeling too well with some nausea. Patient seen in consultation by orthopedics and patient underwent Surgical Repair 11/12/23. PT/OT to evaluate and treat and recommending CIR   Hypotension with shock: Patient noted with systolic blood pressures in the 70s to 80s on presentation to the ED however patient alert oriented and answering questions appropriately.  Shock is likely multifactorial given her blood loss, pain meds, Bacteremia and antihypertensives; patient states on antihypertensive medications which she was told to continue per PCP despite low/soft blood pressure. Given IVF bolus - IV albumin  25 g x 1 again. Had to be placed on Short term Pressors. IVF as below.  Discontinue antihypertensive medications and likely will not resume on discharge.  Started her on midodrine  and will  continue increased to 10 mg 3 times daily. -Checking ECHOCardiogram and showed an EF of 65-70% w/ no RWMA -Check a UA and not very impressive but Urine Cx showing 10,000 CFU of ESBL E Coli. Blood Cx as below. Patient noted with an anemia will transfuse 1 unit PRBCs. Will check Cortisol Level in the AM and was 5.8. PCCM was consulted but signed off the case.  -Continue monitor blood pressures per protocol and last blood pressure was improved at 132/73 -Palliative Care consulted for GOC Discussion  GNR Bacteremia: 1/4 Bottles +. Will start IV Ceftriaxone  and Flagyl . Nothing was positive on BCID but likely Anaerobe and was showing Bacteroides Uniformis. CTM and repeat Blood Cx  Nausea and Diarrhea: C Diff and GI Pathogen Panel Negative. C/w Supportive Care and Antidiarrheals. IVF as below. Consulted Palliative Care Medicine given her frustration w/ Diarrhea   Essential Hypertension: Hold antihypertensive medications secondary to problem #2. Patient noted to have low blood pressure on presentation, states BP has been low/soft with systolics in the 100s however was told to continue her antihypertensive medications per her PCP. Will discontinue antihypertensive medications and not resume on discharge.   Normocytic Anemia: Patient noted to have a drop in hemoglobin of 8.2 from 12.0 on admission. Patient with no overt bleeding but FOBT is Positive, however her skin around her rectum is so broken down though; Per nurse her stool doesn't look like it has any blood in it but the skin around is bloody when she is cleaned. Underwent surgical intervention.  Anemia panel done and showed an iron level of 99, UIBC of 78, TIBC 169, saturation ratios of 58%, ferritin of 135, folate 15.2 and  vitamin B12 105.  Placed on IV PPI 40 mg every 24 and she was typed and screened and Transfused 1 unit of PRBCs due to presentation with hypotension. CTM for S/Sx of Bleeding. Hgb/Hct Trend: Recent Labs  Lab 11/11/23 2038  11/12/23 1259 11/12/23 2021 11/13/23 0632 11/14/23 0324  HGB 12.0 8.2* 7.1* 10.0* 8.0*  HCT 36.9 25.3* 22.8* 30.3* 23.9*  MCV 97.6 100.4* 104.6* 94.7 92.6  -Repeat CBC in the AM   Thrombocytopenia: Platelet Count is now went from 162 -> 108 -> 101 -> 84 -> 100. CTM for S/Sx of Bleeding; no overt bleeding noted. Repeat CBC in the AM   Metabolic Acidosis: Improved. Now has a CO2 of 26, AG of 6, Chloride Level of 103. Start IV Sodium Bicarbonate  @ 75 mL/hr on 11/13/23 and will continue today.  CTM and Trend and repeat CMP in the AM   Hyperlipidemia: C/w Simvastatin  10 mg po Daily  Hypokalemia: K+ is now 3.1. Replete with po K Phos  Neutral 500 mg BID x2. CTM and Replete as Necessary. Repeat CMP in the AM  Hypophosphatemia: Phos Level is now 1.6. Replete with po K Phos  Neutral 500 mg po BID x2. CTM and Replete as Necessary.   Hypomagnesemia: Mag Level is now 1.9. CTM and Replete as Necessary. Repeat Mag Level in the AM  GERD/GI prophylaxis: PCCM has started her on Famotidine  20 mg po Daily   Hypoalbuminemia: Patient's Albumin  Level is now 2.3. CTM and Trend and repeat CMP in the AM   DVT prophylaxis: SCDs Start: 11/11/23 2346    Code Status: Limited: Do not attempt resuscitation (DNR) -DNR-LIMITED -Do Not Intubate/DNI  Family Communication: No family present @ bedside  Disposition Plan:  Level of care: Progressive Status is: Inpatient Remains inpatient appropriate because: Needs further clinical improvement    Consultants:  Orthopedic Surgery PCCM/Pulmonary Palliative Care Medicine   Procedures:  As delineated as above  Antimicrobials:  Anti-infectives (From admission, onward)    Start     Dose/Rate Route Frequency Ordered Stop   11/14/23 1000  metroNIDAZOLE  (FLAGYL ) tablet 500 mg        500 mg Oral Every 12 hours 11/14/23 0833     11/14/23 0900  cefTRIAXone  (ROCEPHIN ) 2 g in sodium chloride  0.9 % 100 mL IVPB        2 g 200 mL/hr over 30 Minutes Intravenous Daily  11/14/23 0815     11/12/23 2200  ceFAZolin  (ANCEF ) IVPB 2g/100 mL premix        2 g 200 mL/hr over 30 Minutes Intravenous Every 6 hours 11/12/23 1735 11/13/23 0622   11/12/23 1800  trimethoprim  (TRIMPEX ) tablet 100 mg        100 mg Oral Every evening 11/12/23 0923     11/12/23 1400  ceFAZolin  (ANCEF ) IVPB 2g/100 mL premix        2 g 200 mL/hr over 30 Minutes Intravenous On call to O.R. 11/12/23 1357 11/12/23 1614       Subjective: Seen and examined at bedside he was still having some diarrhea but states she has no pain in her hip.  Denies any chest pain or heaviness or dyspnea.  Feels weak and still having some nausea.  No vomiting or abdominal discomfort.  No other concerns or complaints at this time  Objective: Vitals:   11/15/23 1553 11/15/23 1600 11/15/23 1700 11/15/23 1812  BP:  132/73    Pulse:  (!) 58 (!) 54   Resp:  19 17   Temp:  97.6 F (36.4 C)   97.6 F (36.4 C)  TempSrc: Oral   Oral  SpO2:  100% 100%   Weight:      Height:        Intake/Output Summary (Last 24 hours) at 11/15/2023 1814 Last data filed at 11/15/2023 1737 Gross per 24 hour  Intake 2645.39 ml  Output 2175 ml  Net 470.39 ml   Filed Weights   11/12/23 1411 11/15/23 0340  Weight: 48.1 kg 56 kg   Examination: Physical Exam:  Constitutional: Thin elderly female appears calm but a little frustrated and fatigued  Respiratory: Diminished to auscultation bilaterally, no wheezing, rales, rhonchi or crackles. Normal respiratory effort and patient is not tachypenic. No accessory muscle use. Unlabored breathing  Cardiovascular: RRR, no murmurs / rubs / gallops. S1 and S2 auscultated. No extremity edema Abdomen: Soft, non-tender, non-distended. Bowel sounds positive.  GU: Deferred. Musculoskeletal: No clubbing / cyanosis of digits/nails. No joint deformity upper and lower extremities.  Skin: No rashes, lesions, ulcers. No induration; Warm and dry.  Neurologic: CN 2-12 grossly intact with no focal deficits.  Romberg sign and cerebellar reflexes not assessed.  Psychiatric: Normal judgment and insight. Alert and oriented x 3.   Data Reviewed: I have personally reviewed following labs and imaging studies  CBC: Recent Labs  Lab 11/11/23 2038 11/12/23 1259 11/12/23 2021 11/13/23 0632 11/14/23 0324 11/15/23 0327  WBC 14.4* 7.8 8.8 8.5 5.3 5.4  NEUTROABS 12.4*  --   --   --  3.5 3.4  HGB 12.0 8.2* 7.1* 10.0* 8.0* 8.3*  HCT 36.9 25.3* 22.8* 30.3* 23.9* 25.0*  MCV 97.6 100.4* 104.6* 94.7 92.6 94.0  PLT 179 162 108* 101* 84* 100*   Basic Metabolic Panel: Recent Labs  Lab 11/11/23 2038 11/12/23 1259 11/13/23 0632 11/13/23 0633 11/14/23 0324 11/15/23 0327  NA 138 139 138  --  137 135  K 3.6 3.8 3.0*  --  3.9 3.1*  CL 112* 115* 114*  --  112* 103  CO2 20* 17* 13*  --  19* 26  GLUCOSE 141* 109* 117*  --  120* 109*  BUN 18 17 15   --  18 12  CREATININE 0.75 0.83 0.84  --  0.76 0.61  CALCIUM  7.6* 7.1* 6.8*  --  7.1* 6.6*  MG 2.1  --  1.6*  --  2.2 1.9  PHOS 3.1  --  3.0 3.0 1.6* 2.6   GFR: Estimated Creatinine Clearance: 35.9 mL/min (by C-G formula based on SCr of 0.61 mg/dL). Liver Function Tests: Recent Labs  Lab 11/11/23 2038 11/12/23 1259 11/13/23 0632 11/14/23 0324 11/15/23 0327  AST 18 19  --  23 26  ALT 15 14  --  7 8  ALKPHOS 25* 20*  --  16* 18*  BILITOT 0.5 0.8  --  0.7 0.8  PROT 5.2* 4.5*  --  3.8* 3.9*  ALBUMIN  2.8* 2.4* 2.6* 2.4* 2.3*   No results for input(s): LIPASE, AMYLASE in the last 168 hours. No results for input(s): AMMONIA in the last 168 hours. Coagulation Profile: Recent Labs  Lab 11/11/23 2038  INR 1.0   Cardiac Enzymes: Recent Labs  Lab 11/11/23 2038  CKTOTAL 111   BNP (last 3 results) No results for input(s): PROBNP in the last 8760 hours. HbA1C: No results for input(s): HGBA1C in the last 72 hours. CBG: No results for input(s): GLUCAP in the last 168 hours. Lipid Profile: No results for input(s): CHOL, HDL,  LDLCALC, TRIG, CHOLHDL, LDLDIRECT in  the last 72 hours. Thyroid Function Tests: No results for input(s): TSH, T4TOTAL, FREET4, T3FREE, THYROIDAB in the last 72 hours. Anemia Panel: Recent Labs    11/12/23 2021 11/13/23 0632  VITAMINB12  --  905  FOLATE  --  15.2  FERRITIN  --  135  TIBC  --  169*  IRON  --  99  RETICCTPCT 2.2  --    Sepsis Labs: Recent Labs  Lab 11/13/23 9629  LATICACIDVEN <0.3*   Recent Results (from the past 240 hours)  Urine Culture (for pregnant, neutropenic or urologic patients or patients with an indwelling urinary catheter)     Status: Abnormal   Collection Time: 11/12/23 12:12 PM   Specimen: Urine, Catheterized  Result Value Ref Range Status   Specimen Description   Final    URINE, CATHETERIZED Performed at [facility], [address] [place], [place] [address]    Special Requests   Final    NONE Performed at [facility], [address] [place], [place] [address]    Culture (A)  Final    10,000 COLONIES/mL ESCHERICHIA COLI Confirmed Extended Spectrum Beta-Lactamase Producer (ESBL).  In bloodstream infections from ESBL organisms, carbapenems are preferred over piperacillin/tazobactam. They are shown to have a lower risk of mortality.    Report Status 11/14/2023 FINAL  Final   Organism ID, Bacteria ESCHERICHIA COLI (A)  Final      Susceptibility   Escherichia coli - MIC*    AMPICILLIN >=32 RESISTANT Resistant     CEFAZOLIN  >=64 RESISTANT Resistant     CEFEPIME 16 RESISTANT Resistant     CEFTRIAXONE  >=64 RESISTANT Resistant     CIPROFLOXACIN  >=4 RESISTANT Resistant     GENTAMICIN <=1 SENSITIVE Sensitive     IMIPENEM <=0.25 SENSITIVE Sensitive     NITROFURANTOIN <=16 SENSITIVE Sensitive     TRIMETH/SULFA 80 RESISTANT Resistant     AMPICILLIN/SULBACTAM >=32 RESISTANT Resistant     PIP/TAZO <=4 SENSITIVE Sensitive ug/mL    * 10,000 COLONIES/mL ESCHERICHIA COLI  Culture, blood (Routine  X 2) w Reflex to ID Panel     Status: None (Preliminary result)   Collection Time: 11/12/23 12:53 PM   Specimen: BLOOD LEFT HAND  Result Value Ref Range Status   Specimen Description   Final    BLOOD LEFT HAND Performed at [facility], [address] [place], [place] [address]    Special Requests   Final    BOTTLES DRAWN AEROBIC AND ANAEROBIC Blood Culture adequate volume Performed at [facility], [address] [place], [place] [address]    Culture   Final    NO GROWTH 3 DAYS Performed at [facility], [address] [place], [place] [address]    Report Status PENDING  Incomplete  Culture, blood (Routine X 2) w Reflex to ID Panel     Status: Abnormal   Collection Time: 11/12/23 12:59 PM   Specimen: BLOOD  Result Value Ref Range Status   Specimen Description   Final    BLOOD Performed at [facility], [address], [place], [place] [address]    Special Requests   Final    BOTTLES DRAWN AEROBIC AND ANAEROBIC Blood Culture adequate volume Performed at [facility], [address], [place], [place] [address]    Culture  Setup Time   Final    GRAM NEGATIVE RODS ANAEROBIC BOTTLE ONLY PHARMD E JACKSON 11/14/2023 @ 0221 BY AB  Culture (A)  Final    BACTEROIDES UNIFORMIS BETA LACTAMASE POSITIVE Performed at [facility], [address], [place], [place] [address]    Report Status 11/15/2023 FINAL  Final  Blood Culture ID Panel (Reflexed)     Status: None   Collection Time: 11/12/23 12:59 PM  Result Value Ref Range Status   Enterococcus faecalis NOT DETECTED NOT DETECTED Final   Enterococcus Faecium NOT DETECTED NOT DETECTED Final   Listeria monocytogenes NOT DETECTED NOT DETECTED Final   Staphylococcus species NOT DETECTED NOT DETECTED Final   Staphylococcus aureus (BCID) NOT DETECTED NOT DETECTED Final   Staphylococcus epidermidis NOT DETECTED NOT DETECTED Final   Staphylococcus lugdunensis NOT DETECTED  NOT DETECTED Final   Streptococcus species NOT DETECTED NOT DETECTED Final   Streptococcus agalactiae NOT DETECTED NOT DETECTED Final   Streptococcus pneumoniae NOT DETECTED NOT DETECTED Final   Streptococcus pyogenes NOT DETECTED NOT DETECTED Final   A.calcoaceticus-baumannii NOT DETECTED NOT DETECTED Final   Bacteroides fragilis NOT DETECTED NOT DETECTED Final   Enterobacterales NOT DETECTED NOT DETECTED Final   Enterobacter cloacae complex NOT DETECTED NOT DETECTED Final   Escherichia coli NOT DETECTED NOT DETECTED Final   Klebsiella aerogenes NOT DETECTED NOT DETECTED Final   Klebsiella oxytoca NOT DETECTED NOT DETECTED Final   Klebsiella pneumoniae NOT DETECTED NOT DETECTED Final   Proteus species NOT DETECTED NOT DETECTED Final   Salmonella species NOT DETECTED NOT DETECTED Final   Serratia marcescens NOT DETECTED NOT DETECTED Final   Haemophilus influenzae NOT DETECTED NOT DETECTED Final   Neisseria meningitidis NOT DETECTED NOT DETECTED Final   Pseudomonas aeruginosa NOT DETECTED NOT DETECTED Final   Stenotrophomonas maltophilia NOT DETECTED NOT DETECTED Final   Candida albicans NOT DETECTED NOT DETECTED Final   Candida auris NOT DETECTED NOT DETECTED Final   Candida glabrata NOT DETECTED NOT DETECTED Final   Candida krusei NOT DETECTED NOT DETECTED Final   Candida parapsilosis NOT DETECTED NOT DETECTED Final   Candida tropicalis NOT DETECTED NOT DETECTED Final   Cryptococcus neoformans/gattii NOT DETECTED NOT DETECTED Final    Comment: Performed at [facility], [address], [place], [place] [address]  MRSA Next Gen by PCR, Nasal     Status: None   Collection Time: 11/12/23 10:48 PM   Specimen: Nasal Mucosa; Nasal Swab  Result Value Ref Range Status   MRSA by PCR Next Gen NOT DETECTED NOT DETECTED Final    Comment: (NOTE) The GeneXpert MRSA Assay (FDA approved for NASAL specimens only), is one component of a comprehensive MRSA colonization surveillance program.  It is not intended to diagnose MRSA infection nor to guide or monitor treatment for MRSA infections. Test performance is not FDA approved in patients less than 56 years old. Performed at [facility], [address], [place], [place] [address]   C Difficile Quick Screen w PCR reflex     Status: None   Collection Time: 11/13/23  7:50 PM   Specimen: STOOL  Result Value Ref Range Status   C Diff antigen NEGATIVE NEGATIVE Final   C Diff toxin NEGATIVE NEGATIVE Final   C Diff interpretation No C. difficile detected.  Final    Comment: Performed at [facility], [address] [place], [place] [address]  Gastrointestinal Panel by PCR , Stool     Status: None   Collection Time: 11/14/23 10:29 AM   Specimen: Stool  Result Value Ref Range Status   Campylobacter species NOT  DETECTED NOT DETECTED Final   Plesimonas shigelloides NOT DETECTED NOT DETECTED Final   Salmonella species NOT DETECTED NOT DETECTED Final   Yersinia enterocolitica NOT DETECTED NOT DETECTED Final   Vibrio species NOT DETECTED NOT DETECTED Final   Vibrio cholerae NOT DETECTED NOT DETECTED Final   Enteroaggregative E coli (EAEC) NOT DETECTED NOT DETECTED Final   Enteropathogenic E coli (EPEC) NOT DETECTED NOT DETECTED Final   Enterotoxigenic E coli (ETEC) NOT DETECTED NOT DETECTED Final   Shiga like toxin producing E coli (STEC) NOT DETECTED NOT DETECTED Final   Shigella/Enteroinvasive E coli (EIEC) NOT DETECTED NOT DETECTED Final   Cryptosporidium NOT DETECTED NOT DETECTED Final   Cyclospora cayetanensis NOT DETECTED NOT DETECTED Final   Entamoeba histolytica NOT DETECTED NOT DETECTED Final   Giardia lamblia NOT DETECTED NOT DETECTED Final   Adenovirus F40/41 NOT DETECTED NOT DETECTED Final   Astrovirus NOT DETECTED NOT DETECTED Final   Norovirus GI/GII NOT DETECTED NOT DETECTED Final   Rotavirus A NOT DETECTED NOT DETECTED Final   Sapovirus (I, II, IV, and V) NOT DETECTED NOT  DETECTED Final    Comment: Performed at [facility], [address], [place], [place] [address]    Radiology Studies: No results found.  Scheduled Meds:  acetaminophen   1,000 mg Oral Q6H   aspirin   81 mg Oral BID   Chlorhexidine  Gluconate Cloth  6 each Topical Daily   dorzolamide -timolol   1 drop Both Eyes BID   famotidine   20 mg Oral Daily   feeding supplement  237 mL Oral BID BM   latanoprost   1 drop Both Eyes QHS   metroNIDAZOLE   500 mg Oral Q12H   midodrine   10 mg Oral TID WC   multivitamin with minerals  1 tablet Oral Daily   pantoprazole   40 mg Oral Daily   potassium chloride   40 mEq Oral BID   simvastatin   10 mg Oral q1800   trimethoprim   100 mg Oral QPM   Continuous Infusions:  sodium chloride      calcium  gluconate     cefTRIAXone  (ROCEPHIN )  IV Stopped (11/15/23 0908)   sodium bicarbonate  150 mEq in dextrose  5 % 1,150 mL infusion 75 mL/hr at 11/15/23 1737    LOS: 4 days   Aura Leeds, DO [facility] Available via Epic secure chat 7am-7pm After these hours, please refer to coverage provider listed on [email or web address] 11/15/2023, 6:14 PM

## 2023-11-15 NOTE — TOC Progression Note (Signed)
 Transition of Care [facility]) - Progression Note    Patient Details  Name: Janet Cooper MRN: [id number] Date of Birth: Jun 16, 1929  Transition of Care [facility]) CM/SW Contact  Tessie Fila, RN Phone Number: 11/15/2023, 1:57 PM  Clinical Narrative:    PT recommending Acute Inpatient Rehab. TOC will continue to follow.   Expected Discharge Plan: Home w Home Health Services Barriers to Discharge: Continued Medical Work up  Expected Discharge Plan and Services                                               Social Determinants of Health (SDOH) Interventions SDOH Screenings   Food Insecurity: Patient Unable To Answer (11/13/2023)  Housing: Patient Unable To Answer (11/13/2023)  Utilities: Patient Unable To Answer (11/13/2023)  Social Connections: Patient Unable To Answer (11/13/2023)  Tobacco Use: Low Risk  (11/12/2023)    Readmission Risk Interventions    11/14/2023   11:13 AM  Readmission Risk Prevention Plan  Post Dischage Appt Complete  Medication Screening Complete  Transportation Screening Complete

## 2023-11-15 NOTE — Evaluation (Signed)
 Occupational Therapy Evaluation Patient Details Name: Janet Cooper MRN: [id number] DOB: January 09, 1930 Today's Date: 11/15/2023   History of Present Illness   Janet Cooper is a 89 year old female admitted on 11/11/23 s/p fall in her garage down steps sustaining R intertrochanteric femoral fx with R THA anterior approach on 11/12/23. Complications of hypotension, low Hgb,and GNR Bacteremia requiring pressors (short term, now weaned), PRBC, and transfer to ICU. PMH: hypertension, hyperlipidemia     Clinical Impressions PTA, patient was living alone at home, amb only with SPC intermittently and independent with A/IADL's including driving. Currently, patient presents with deficits outlined below (see OT Problem List for details) most significantly, decreased activity tolerance, balance, generalized muscle weakness with reports of intermittent dizziness and nausea impacting ADL's and functional mobility. Patient motivated and open for all therapy presented. Will continue to follow on Acute hospital unit to progress function and allow for discharge. Patient will benefit from intensive inpatient follow-up therapy, >3 hours/day.       If plan is discharge home, recommend the following:   A lot of help with walking and/or transfers;A lot of help with bathing/dressing/bathroom;Assistance with cooking/housework;Direct supervision/assist for medications management;Direct supervision/assist for financial management;Assist for transportation;Help with stairs or ramp for entrance     Functional Status Assessment   Patient has had a recent decline in their functional status and demonstrates the ability to make significant improvements in function in a reasonable and predictable amount of time.     Equipment Recommendations   None recommended by OT (tba post rehab stay)      Precautions/Restrictions   Precautions Precautions: Fall Restrictions Weight Bearing Restrictions Per Provider  Order: Yes RLE Weight Bearing Per Provider Order: Weight bearing as tolerated     Mobility Bed Mobility Overal bed mobility: Needs Assistance Bed Mobility: Supine to Sit, Sit to Supine     Supine to sit: Min assist, +2 for safety/equipment Sit to supine: +2 for physical assistance, Max assist, HOB elevated, Used rails   General bed mobility comments: To sit: Pt able to move to EOB with min A of 2 for safety/comfort in lifting trunk and scooting forward.  To supine: Did max of 2 in order to screen for BPPV with bed flat and in trendlenberg.    Transfers Overall transfer level: Needs assistance Equipment used: Rolling walker (2 wheels) Transfers: Sit to/from Stand Sit to Stand: Min assist, +2 safety/equipment           General transfer comment: as patient stood, pt had loose BM limiting progression from standing EOB, did take small sidesteps to [facility] laterally      Balance Overall balance assessment: Needs assistance Sitting-balance support: No upper extremity supported Sitting balance-Leahy Scale: Good     Standing balance support: Bilateral upper extremity supported, Reliant on assistive device for balance Standing balance-Leahy Scale: Poor                             ADL either performed or assessed with clinical judgement   ADL Overall ADL's : Needs assistance/impaired Eating/Feeding: Set up;Bed level   Grooming: Wash/dry hands;Wash/dry face;Contact guard assist   Upper Body Bathing: Bed level;Moderate assistance   Lower Body Bathing: Total assistance;Bed level   Upper Body Dressing : Moderate assistance;Bed level   Lower Body Dressing: Total assistance;Bed level   Toilet Transfer:  (Stood EOB and prior to transfer patient had loose stool)   Toileting- Clothing Manipulation and Hygiene: Total assistance  Functional mobility during ADLs:  (see below for STS with RW) General ADL Comments: decreased functioanl reach to LE's     Vision  Baseline Vision/History: 0 No visual deficits Ability to See in Adequate Light: 0 Adequate  No nystagmus with gross BPPV testing but + symptoms for n/v            Pertinent Vitals/Pain Pain Assessment Pain Assessment: No/denies pain     Extremity/Trunk Assessment Upper Extremity Assessment Upper Extremity Assessment: Right hand dominant;Generalized weakness   Lower Extremity Assessment Lower Extremity Assessment: Defer to PT evaluation RLE Deficits / Details: Expected post op changes; pain well controlled; ROM WFL; MMT at least 3/5 throughotu not further tested LLE Deficits / Details: ROM WFL; MMT 5/5   Cervical / Trunk Assessment Cervical / Trunk Assessment: Normal   Communication Communication Communication: No apparent difficulties   Cognition Arousal: Alert Behavior During Therapy: Anxious Cognition: No apparent impairments                               Following commands: Intact       Cueing  General Comments   Cueing Techniques: Verbal cues  see PT note for gross assessment of BPPV, significant c/o of not feeling well and episodes of DOE to RR of 24-26 with cues for breathing down to 16-18 RR, educated throughout on breathing strategy integration   Exercises     Shoulder Instructions      Home Living Family/patient expects to be discharged to:: Private residence Living Arrangements: Alone Available Help at Discharge: Neighbor;Available PRN/intermittently;Friend(s) Type of Home: House       Home Layout: Multi-level;Bed/bath upstairs;Full bath on main level Alternate Level Stairs-Number of Steps: Flight Alternate Level Stairs-Rails: Right Bathroom Shower/Tub: Tub/shower unit;Walk-in shower         Home Equipment: Jeananne Mighty - single point   Additional Comments: reported on fall 8 yrs ago with L ulnar fx, reports seeing multiple MD's regarding dizziness the last few weeks before the fall      Prior Functioning/Environment Prior Level of  Function : Independent/Modified Independent;Driving             Mobility Comments: Uses cane - could ambulate in community; denies other falls ADLs Comments: Independent with ADLs and IADLs    OT Problem List: Decreased strength;Decreased activity tolerance;Impaired balance (sitting and/or standing);Decreased knowledge of use of DME or AE;Decreased knowledge of precautions;Cardiopulmonary status limiting activity   OT Treatment/Interventions: Self-care/ADL training;Therapeutic exercise;Energy conservation;DME and/or AE instruction;Therapeutic activities;Patient/family education;Balance training      OT Goals(Current goals can be found in the care plan section)   Acute Rehab OT Goals Patient Stated Goal: to feel better OT Goal Formulation: With patient Time For Goal Achievement: 11/29/23 Potential to Achieve Goals: Good ADL Goals Pt Will Perform Upper Body Bathing: with supervision;sitting Pt Will Perform Lower Body Bathing: with mod assist;sit to/from stand Pt Will Perform Upper Body Dressing: with supervision;sitting Pt Will Perform Lower Body Dressing: with mod assist;sit to/from stand Pt Will Transfer to Toilet: with mod assist;bedside commode   OT Frequency:  Min 2X/week    Co-evaluation   Reason for Co-Treatment: For patient/therapist safety;Complexity of the patient's impairments (multi-system involvement) PT goals addressed during session: Mobility/safety with mobility OT goals addressed during session: ADL's and self-care      AM-PAC OT 6 Clicks Daily Activity     Outcome Measure Help from another person eating meals?: A Little Help from another  person taking care of personal grooming?: A Lot Help from another person toileting, which includes using toliet, bedpan, or urinal?: Total Help from another person bathing (including washing, rinsing, drying)?: A Lot Help from another person to put on and taking off regular upper body clothing?: A Lot Help from  another person to put on and taking off regular lower body clothing?: Total 6 Click Score: 11   End of Session Equipment Utilized During Treatment: Gait belt;Rolling walker (2 wheels) Nurse Communication: Mobility status;Precautions  Activity Tolerance: Patient limited by fatigue Patient left: in bed;with call bell/phone within reach;with bed alarm set (in chair position)  OT Visit Diagnosis: Unsteadiness on feet (R26.81);Other abnormalities of gait and mobility (R26.89);History of falling (Z91.81);Muscle weakness (generalized) (M62.81);Dizziness and giddiness (R42)                Time: 1610-9604 OT Time Calculation (min): 40 min Charges:  OT General Charges $OT Visit: 1 Visit OT Evaluation $OT Eval Moderate Complexity: 1 Mod OT Treatments $Self Care/Home Management : 8-22 mins  Dat Derksen OT/L Acute Rehabilitation Department  [phone]   11/15/2023, 4:26 PM

## 2023-11-15 NOTE — Evaluation (Addendum)
 Physical Therapy Evaluation Patient Details Name: Janet Cooper MRN: [id number] DOB: 11/19/1929 Today's Date: 11/15/2023  History of Present Illness  Janet Cooper is a 89 year old female admitted on 11/11/23 s/p fall in her garage down steps sustaining R intertrochanteric femoral fx with R THA anterior approach on 11/12/23. Complications of hypotension, low Hgb,and GNR Bacteremia requiring pressors (short term, now weaned), PRBC, and transfer to ICU. PMH: hypertension, hyperlipidemia  Clinical Impression  Pt admitted with above diagnosis. At baseline, pt is independent and lives alone in multi-level home.  Today, pt reports hip pain is well controlled but she just doesn't feel well.  Her vitals signs were stable during therapy other than RR would occasionally increase to 25 bpm but with cues for relaxation and deep breaths would return to normal.  She has had this not feeling well in the past and was occurring prior to this admission. She was able to transition to EOB, stand, and take steps toward [facility] all with min A of 2 for safety.  Performed brief vestibular screen and pt reports not feeling well with smooth pursuit, not able to tolerate full testing for BPPV due to nausea, and would benefit from further screening when able.  Did also note pt with GNR being treated for nausea, vomiting, and has chronic diarrhea.  Pt expected to progress well with therapy. Pt currently with functional limitations due to the deficits listed below (see PT Problem List). Pt will benefit from acute skilled PT to increase their independence and safety with mobility to allow discharge.  Patient will benefit from intensive inpatient follow-up therapy, >3 hours/day as she lives alone in multi-level home and with good rehab potential.          If plan is discharge home, recommend the following: A lot of help with walking and/or transfers;A lot of help with bathing/dressing/bathroom   Can travel by private vehicle    No    Equipment Recommendations Rolling walker (2 wheels)  Recommendations for Other Services       Functional Status Assessment Patient has had a recent decline in their functional status and demonstrates the ability to make significant improvements in function in a reasonable and predictable amount of time.     Precautions / Restrictions Precautions Precautions: Fall Restrictions Weight Bearing Restrictions Per Provider Order: Yes RLE Weight Bearing Per Provider Order: Weight bearing as tolerated      Mobility  Bed Mobility Overal bed mobility: Needs Assistance Bed Mobility: Supine to Sit, Sit to Supine     Supine to sit: Min assist, +2 for safety/equipment Sit to supine: +2 for physical assistance, Max assist (see below)   General bed mobility comments: To sit: Pt able to move to EOB with min A of 2 for safety/comfort in lifting trunk and scooting forward.  To supine: Did max of 2 in order to screen for BPPV with bed flat and in trendlenberg.    Transfers Overall transfer level: Needs assistance Equipment used: Rolling walker (2 wheels) Transfers: Sit to/from Stand Sit to Stand: Min assist, +2 safety/equipment                Ambulation/Gait Ambulation/Gait assistance: Min assist, +2 safety/equipment Gait Distance (Feet): 2 Feet Assistive device: Rolling walker (2 wheels) Gait Pattern/deviations: Step-to pattern, Decreased stride length Gait velocity: decreased     General Gait Details: Side steps at EOB; assist for balance and RW; limited by not feeling well  Stairs  Wheelchair Mobility     Tilt Bed    Modified Rankin (Stroke Patients Only)       Balance Overall balance assessment: Needs assistance Sitting-balance support: No upper extremity supported Sitting balance-Leahy Scale: Good     Standing balance support: Bilateral upper extremity supported, Reliant on assistive device for balance Standing balance-Leahy Scale: Poor                                Pertinent Vitals/Pain Pain Assessment Pain Assessment: No/denies pain    Home Living Family/patient expects to be discharged to:: Private residence Living Arrangements: Alone Available Help at Discharge: Neighbor;Available PRN/intermittently;Friend(s) Type of Home: House       Alternate Level Stairs-Number of Steps: Flight Home Layout: Multi-level;Bed/bath upstairs;Full bath on main level Home Equipment: Cane - single point      Prior Function Prior Level of Function : Independent/Modified Independent;Driving             Mobility Comments: Uses cane - could ambulate in community; denies other falls ADLs Comments: Independent with ADLs and IADLs     Extremity/Trunk Assessment   Upper Extremity Assessment Upper Extremity Assessment: Defer to OT evaluation    Lower Extremity Assessment Lower Extremity Assessment: LLE deficits/detail;RLE deficits/detail RLE Deficits / Details: Expected post op changes; pain well controlled; ROM WFL; MMT at least 3/5 throughotu not further tested LLE Deficits / Details: ROM WFL; MMT 5/5    Cervical / Trunk Assessment Cervical / Trunk Assessment: Normal  Communication        Cognition Arousal: Alert Behavior During Therapy: Anxious   PT - Cognitive impairments: No apparent impairments                       PT - Cognition Comments: Increased time for date; frequent cues for relaxation and deep breathing         Cueing       General Comments General comments (skin integrity, edema, etc.):   Pt had constant c/o of just not feeling good.  States she has had in past and was seen by ENT who assessed for BPPV, but ended up being told moved to quickly and her BP could not keep up -likely orthostatic hypotension.  Reports she was having this feeling again prior to her fall and was going to make appt with PCP but ended up breaking her hip.  Of note -pt also found to have GNR and  being treated for nausea/vomitting.  Pt reports chronic diarrhea for 10 years.  Today, pt reports this just not feeling well throughout session.  States did not hit head when fell. Never described as spinning or dizzy.  No major changes with position other than nausea did increase and dry heaved when returned to flat/trendlenberg bed (no nystagmus noted).  Did report felt worse with visual tracking/smooth pursuit again no nystagmus.  Head turns and gaze stabilization intact and did not worsen symptoms.  Could benefit from further vestibular screening when able.    Exercises     Assessment/Plan    PT Assessment Patient needs continued PT services  PT Problem List Decreased strength;Pain;Decreased range of motion;Decreased activity tolerance;Decreased balance;Decreased mobility;Decreased knowledge of use of DME       PT Treatment Interventions DME instruction;Therapeutic exercise;Gait training;Stair training;Functional mobility training;Therapeutic activities;Patient/family education;Modalities;Balance training    PT Goals (Current goals can be found in the Care Plan section)  Acute Rehab PT  Goals Patient Stated Goal: feel better PT Goal Formulation: With patient Time For Goal Achievement: 11/29/23 Potential to Achieve Goals: Good    Frequency Min 2X/week     Co-evaluation PT/OT/SLP Co-Evaluation/Treatment: Yes Reason for Co-Treatment: For patient/therapist safety;Complexity of the patient's impairments (multi-system involvement) PT goals addressed during session: Mobility/safety with mobility OT goals addressed during session: ADL's and self-care       AM-PAC PT 6 Clicks Mobility  Outcome Measure Help needed turning from your back to your side while in a flat bed without using bedrails?: A Little Help needed moving from lying on your back to sitting on the side of a flat bed without using bedrails?: A Lot Help needed moving to and from a bed to a chair (including a  wheelchair)?: A Lot Help needed standing up from a chair using your arms (e.g., wheelchair or bedside chair)?: A Lot Help needed to walk in hospital room?: Total Help needed climbing 3-5 steps with a railing? : Total 6 Click Score: 11    End of Session Equipment Utilized During Treatment: Gait belt Activity Tolerance: Patient tolerated treatment well Patient left: in bed;with call bell/phone within reach;with SCD's reapplied Nurse Communication: Mobility status (request nausea meds; had BM and was cleaned but is constantly having diarrhea) PT Visit Diagnosis: Other abnormalities of gait and mobility (R26.89);Muscle weakness (generalized) (M62.81);Dizziness and giddiness (R42)    Time: 7829-5621 PT Time Calculation (min) (ACUTE ONLY): 37 min   Charges:   PT Evaluation $PT Eval Moderate Complexity: 1 Mod   PT General Charges $$ ACUTE PT VISIT: 1 Visit         Cyd Dowse, PT Acute Rehab [facility] [phone]   Carolynn Citrin 11/15/2023, 12:49 PM

## 2023-11-15 NOTE — Progress Notes (Signed)
 Inpatient Rehab Admissions Coordinator Note:   Per PT recommendations patient was screened for CIR candidacy by Mickey Alar, PT. At this time, pt appears to be a potential candidate for CIR. I will place an order for rehab consult for full assessment, per our protocol.  Please contact me any with questions.Loye Rumble, PT, DPT [phone] 11/15/23 2:03 PM

## 2023-11-16 ENCOUNTER — Inpatient Hospital Stay (HOSPITAL_COMMUNITY)

## 2023-11-16 DIAGNOSIS — R579 Shock, unspecified: Secondary | ICD-10-CM | POA: Diagnosis not present

## 2023-11-16 DIAGNOSIS — S72001P Fracture of unspecified part of neck of right femur, subsequent encounter for closed fracture with malunion: Secondary | ICD-10-CM | POA: Diagnosis not present

## 2023-11-16 DIAGNOSIS — R0600 Dyspnea, unspecified: Secondary | ICD-10-CM

## 2023-11-16 DIAGNOSIS — D649 Anemia, unspecified: Secondary | ICD-10-CM | POA: Diagnosis not present

## 2023-11-16 DIAGNOSIS — Z515 Encounter for palliative care: Secondary | ICD-10-CM

## 2023-11-16 DIAGNOSIS — E785 Hyperlipidemia, unspecified: Secondary | ICD-10-CM | POA: Diagnosis not present

## 2023-11-16 DIAGNOSIS — S72141A Displaced intertrochanteric fracture of right femur, initial encounter for closed fracture: Secondary | ICD-10-CM | POA: Diagnosis not present

## 2023-11-16 LAB — MAGNESIUM: Magnesium: 1.9 mg/dL (ref 1.7–2.4)

## 2023-11-16 LAB — CBC WITH DIFFERENTIAL/PLATELET
Abs Immature Granulocytes: 0.03 10*3/uL (ref 0.00–0.07)
Basophils Absolute: 0 10*3/uL (ref 0.0–0.1)
Basophils Relative: 0 %
Eosinophils Absolute: 0.5 10*3/uL (ref 0.0–0.5)
Eosinophils Relative: 8 %
HCT: 30.4 % — ABNORMAL LOW (ref 36.0–46.0)
Hemoglobin: 10.1 g/dL — ABNORMAL LOW (ref 12.0–15.0)
Immature Granulocytes: 1 %
Lymphocytes Relative: 17 %
Lymphs Abs: 1.1 10*3/uL (ref 0.7–4.0)
MCH: 31.6 pg (ref 26.0–34.0)
MCHC: 33.2 g/dL (ref 30.0–36.0)
MCV: 95 fL (ref 80.0–100.0)
Monocytes Absolute: 0.6 10*3/uL (ref 0.1–1.0)
Monocytes Relative: 9 %
Neutro Abs: 4 10*3/uL (ref 1.7–7.7)
Neutrophils Relative %: 65 %
Platelets: 159 10*3/uL (ref 150–400)
RBC: 3.2 MIL/uL — ABNORMAL LOW (ref 3.87–5.11)
RDW: 16.3 % — ABNORMAL HIGH (ref 11.5–15.5)
WBC: 6.1 10*3/uL (ref 4.0–10.5)
nRBC: 0 % (ref 0.0–0.2)

## 2023-11-16 LAB — COMPREHENSIVE METABOLIC PANEL WITH GFR
ALT: 11 U/L (ref 0–44)
AST: 37 U/L (ref 15–41)
Albumin: 2.4 g/dL — ABNORMAL LOW (ref 3.5–5.0)
Alkaline Phosphatase: 23 U/L — ABNORMAL LOW (ref 38–126)
Anion gap: 7 (ref 5–15)
BUN: 8 mg/dL (ref 8–23)
CO2: 25 mmol/L (ref 22–32)
Calcium: 7.4 mg/dL — ABNORMAL LOW (ref 8.9–10.3)
Chloride: 105 mmol/L (ref 98–111)
Creatinine, Ser: 0.74 mg/dL (ref 0.44–1.00)
GFR, Estimated: >60 mL/min (ref >60)
Glucose, Bld: 160 mg/dL — ABNORMAL HIGH (ref 70–99)
Potassium: 4.1 mmol/L (ref 3.5–5.1)
Sodium: 137 mmol/L (ref 135–145)
Total Bilirubin: 1 mg/dL (ref 0.0–1.2)
Total Protein: 4.3 g/dL — ABNORMAL LOW (ref 6.5–8.1)

## 2023-11-16 LAB — PHOSPHORUS: Phosphorus: 1.5 mg/dL — ABNORMAL LOW (ref 2.5–4.6)

## 2023-11-16 LAB — BRAIN NATRIURETIC PEPTIDE: B Natriuretic Peptide: 472.9 pg/mL — ABNORMAL HIGH (ref 0.0–100.0)

## 2023-11-16 MED ORDER — IPRATROPIUM BROMIDE 0.02 % IN SOLN
0.5000 mg | Freq: Four times a day (QID) | RESPIRATORY_TRACT | Status: DC | PRN
  Administered 2023-11-16: 0.5 mg via RESPIRATORY_TRACT
  Filled 2023-11-16: qty 2.5

## 2023-11-16 MED ORDER — SODIUM CHLORIDE 0.9 % IV SOLN: INTRAVENOUS | Status: DC

## 2023-11-16 MED ORDER — MIDODRINE HCL 5 MG PO TABS
5.0000 mg | ORAL_TABLET | Freq: Three times a day (TID) | ORAL | Status: DC
  Administered 2023-11-17: 5 mg via ORAL
  Filled 2023-11-16 (×2): qty 1

## 2023-11-16 MED ORDER — LEVALBUTEROL HCL 0.63 MG/3ML IN NEBU
0.6300 mg | INHALATION_SOLUTION | Freq: Four times a day (QID) | RESPIRATORY_TRACT | Status: DC | PRN
  Administered 2023-11-16: 0.63 mg via RESPIRATORY_TRACT
  Filled 2023-11-16: qty 3

## 2023-11-16 MED ORDER — SODIUM PHOSPHATES 45 MMOLE/15ML IV SOLN
15.0000 mmol | Freq: Once | INTRAVENOUS | Status: AC
  Administered 2023-11-16: 15 mmol via INTRAVENOUS
  Filled 2023-11-16: qty 5

## 2023-11-16 NOTE — Progress Notes (Signed)
 PROGRESS NOTE    Janet Cooper  ZOX:[id number] DOB: Jul 25, 1929 DOA: 11/11/2023 PCP: Lory Rough., PA-C   Brief Narrative:  The patient is an 89 year old female history of hypertension, hyperlipidemia presenting to the ED while was walking down 2 steps into her garage and landed on her right side with significant right hip pain. Imaging done consistent with right hip fracture.  She underwent surgical intervention but was also noted to be hypotensive.  Antihypertensives were held and she is typed and screened transfuse 1 unit PRBCs. Transferred to the ICU. CCM was consulted given her hypotension she had to be placed on low-dose Neo-Synephrine which has been weaned off.  Hemoglobin is stable now. PT/OT recommending CIR. GI Pathogen Panel Negative so will add Antidiarrheals and diarrhea is slowly improving.. BP improving and will CTM. Improving slowly and transferred to Telemetry 11/16/23.  Now complaining of some shortness of breath, so will stop IVF and obtain a chest x-ray.  Assessment and Plan:  Right Intertrochanteric Femoral Fracture Secondary to mechanical fall:  Patient noted to be hypotensive and have low blood pressure which she states is at baseline and on antihypertensive medications. Patient states over the past few days has been having feelings of motion sickness and just not feeling too well with some nausea. Patient seen in consultation by orthopedics and patient underwent Surgical Repair 11/12/23. PT/OT to evaluate and treat and recommending CIR   Hypotension with shock: Patient noted with systolic blood pressures in the 70s to 80s on presentation to the ED however patient alert oriented and answering questions appropriately.  Shock is likely multifactorial given her blood loss, pain meds, Bacteremia and antihypertensives; patient states on antihypertensive medications which she was told to continue per PCP despite low/soft blood pressure. Given IVF bolus and Resuscitation. - IV  albumin  25 g x 2. Had to be placed on Short term Pressors. IVF now stopped.  Discontinue antihypertensive medications and likely will not resume on discharge.  Started her on midodrine  and will continue; Was increased to 10 mg 3 times daily but will cut back to 5 mg TID given improvement in BP -Checking ECHOCardiogram and showed an EF of 65-70% w/ no RWMA; Left ventricular diastolic parameters are indeterminate. And RVSF was normal -Checked a UA and not very impressive but Urine Cx showing 10,000 CFU of ESBL E Coli. Blood Cx as below. Patient noted with an anemia will transfuse 1 unit PRBCs. Will check Cortisol Level in the AM and was 5.8. PCCM was consulted but signed off the case.  -Continue monitor blood pressures per protocol and last blood pressure was improved at 132/73 -Palliative Care consulted for GOC Discussion  GNR Bacteremia: 1/4 Bottles +. C/w IV Ceftriaxone  and Flagyl . Nothing was positive on BCID but likely Anaerobe and was showing Bacteroides Uniformis that was Beta Lactamase positive. CTM and repeat Blood Cx in the AM  Nausea and Diarrhea: C Diff and GI Pathogen Panel Negative. C/w Supportive Care and Antidiarrheals w/ Loperamide. IVF as below to stop. Consulted Palliative Care Medicine given her frustration w/ Diarrhea.  Dyspnea and SOB: Stop IVF and Check CXR. She is + 3.368 Liters since Admission. Will add Xopenex/Atrovent PRN. BNP was 472.9.    Essential Hypertension: Hold antihypertensive medications secondary to problem #2. Patient noted to have low blood pressure on presentation, states BP has been low/soft with systolics in the 100s however was told to continue her antihypertensive medications per her PCP. Will discontinue antihypertensive medications and not resume on discharge. BP is  improved and IVF now stopping. Last BP reading was 131/87   Normocytic Anemia: Patient noted to have a drop in hemoglobin of 8.2 from 12.0 on admission. Patient with no overt bleeding but FOBT is  Positive, however her skin around her rectum is so broken down though; Per nurse her stool doesn't look like it has any blood in it but the skin around is bloody when she is cleaned. Underwent surgical intervention.  Anemia panel done and showed an iron level of 99, UIBC of 78, TIBC 169, saturation ratios of 58%, ferritin of 135, folate 15.2 and vitamin B12 105. Typed and screened and Transfused 1 unit of PRBCs due to presentation with hypotension. CTM for S/Sx of Bleeding. Hgb/Hct Trend: Recent Labs  Lab 11/11/23 2038 11/12/23 1259 11/12/23 2021 11/13/23 3086 11/14/23 0324 11/15/23 0327 11/16/23 1020  HGB 12.0 8.2* 7.1* 10.0* 8.0* 8.3* 10.1*  HCT 36.9 25.3* 22.8* 30.3* 23.9* 25.0* 30.4*  MCV 97.6 100.4* 104.6* 94.7 92.6 94.0 95.0  -Repeat CBC in the AM and change   Thrombocytopenia: Platelet Count is now went from 162 -> 108 -> 101 -> 84 -> 100 -> 159. CTM for S/Sx of Bleeding; no overt bleeding noted. Repeat CBC in the AM   Metabolic Acidosis: Improved. Now has a CO2 of 25, AG of 7, Chloride Level of 105. IV Sodium Bicarbonate  @ 75 mL/hr  that was started on 11/13/23 and will Stop now.  CTM and Trend and repeat CMP in the AM   Hyperlipidemia: C/w Simvastatin  10 mg po Daily  Hypokalemia: K+ is now 4.1.CTM and Replete as Necessary. Repeat CMP in the AM  Hypophosphatemia: Phos Level is now 1.5. Replete with po  Sodium Phos 15 mmol. CTM and Replete as Necessary.   Hypomagnesemia: Mag Level is now 1.9. CTM and Replete as Necessary. Repeat Mag Level in the AM  GERD/GI prophylaxis: PCCM has started her on Famotidine  20 mg po Daily   Hypoalbuminemia: Patient's Albumin  Level is now 2.4. CTM and Trend and repeat CMP in the AM   DVT prophylaxis: SCDs Start: 11/11/23 2346    Code Status: Limited: Do not attempt resuscitation (DNR) -DNR-LIMITED -Do Not Intubate/DNI  Family Communication: No family present @ bedside  Disposition Plan:  Level of care: Telemetry Status is: Inpatient Remains  inpatient appropriate because: Needs further clinical improvement and PT/OT recommending CIR   Consultants:  PCCM Palliative Care  Procedures:  As delineated as above  Antimicrobials:  Anti-infectives (From admission, onward)    Start     Dose/Rate Route Frequency Ordered Stop   11/14/23 1000  metroNIDAZOLE  (FLAGYL ) tablet 500 mg        500 mg Oral Every 12 hours 11/14/23 0833     11/14/23 0900  cefTRIAXone  (ROCEPHIN ) 2 g in sodium chloride  0.9 % 100 mL IVPB        2 g 200 mL/hr over 30 Minutes Intravenous Daily 11/14/23 0815     11/12/23 2200  ceFAZolin  (ANCEF ) IVPB 2g/100 mL premix        2 g 200 mL/hr over 30 Minutes Intravenous Every 6 hours 11/12/23 1735 11/13/23 0622   11/12/23 1800  trimethoprim  (TRIMPEX ) tablet 100 mg        100 mg Oral Every evening 11/12/23 0923     11/12/23 1400  ceFAZolin  (ANCEF ) IVPB 2g/100 mL premix        2 g 200 mL/hr over 30 Minutes Intravenous On call to O.R. 11/12/23 1357 11/12/23 1614  Subjective: Seen and examined at bedside and she is doing a bit better today compared to yesterday.  Thinks her diarrhea is slowing down.  Denies any complaints but hoping to get up today.  States therapy was able to stand her up yesterday.  No other concerns or complaints at this time.  Objective: Vitals:   11/16/23 1200 11/16/23 1221 11/16/23 1440 11/16/23 1643  BP: 127/73  127/81 131/87  Pulse: 60  67 63  Resp: (!) 25  18 20   Temp:  97.8 F (36.6 C) 98.8 F (37.1 C)   TempSrc:  Oral    SpO2: 100%  96% 97%  Weight:      Height:        Intake/Output Summary (Last 24 hours) at 11/16/2023 1742 Last data filed at 11/16/2023 1200 Gross per 24 hour  Intake 1306.9 ml  Output 1850 ml  Net -543.1 ml   Filed Weights   11/12/23 1411 11/15/23 0340  Weight: 48.1 kg 56 kg   Examination: Physical Exam:  Constitutional: Thin elderly female who is appears little bit improved compared to yesterday and is more comfortable Respiratory: Diminished to  auscultation bilaterally with some coarse breath sounds, no wheezing, rales, rhonchi or crackles. Normal respiratory effort and patient is not tachypenic. No accessory muscle use.  Unlabored breathing Cardiovascular: RRR, no murmurs / rubs / gallops. S1 and S2 auscultated. No extremity edema. .  Abdomen: Soft, non-tender, non-distended. Bowel sounds positive.  GU: Deferred. Musculoskeletal: No clubbing / cyanosis of digits/nails. No joint deformity upper and lower extremities.  Neurologic: CN 2-12 grossly intact with no focal deficits. Romberg sign and cerebellar reflexes not assessed.  Psychiatric: Is awake, alert and oriented  Data Reviewed: I have personally reviewed following labs and imaging studies  CBC: Recent Labs  Lab 11/11/23 2038 11/12/23 1259 11/12/23 2021 11/13/23 0632 11/14/23 0324 11/15/23 0327 11/16/23 1020  WBC 14.4*   < > 8.8 8.5 5.3 5.4 6.1  NEUTROABS 12.4*  --   --   --  3.5 3.4 4.0  HGB 12.0   < > 7.1* 10.0* 8.0* 8.3* 10.1*  HCT 36.9   < > 22.8* 30.3* 23.9* 25.0* 30.4*  MCV 97.6   < > 104.6* 94.7 92.6 94.0 95.0  PLT 179   < > 108* 101* 84* 100* 159   < > = values in this interval not displayed.   Basic Metabolic Panel: Recent Labs  Lab 11/11/23 2038 11/12/23 1259 11/13/23 7829 11/13/23 0633 11/14/23 0324 11/15/23 0327 11/16/23 1020  NA 138 139 138  --  137 135 137  K 3.6 3.8 3.0*  --  3.9 3.1* 4.1  CL 112* 115* 114*  --  112* 103 105  CO2 20* 17* 13*  --  19* 26 25  GLUCOSE 141* 109* 117*  --  120* 109* 160*  BUN 18 17 15   --  18 12 8   CREATININE 0.75 0.83 0.84  --  0.76 0.61 0.74  CALCIUM  7.6* 7.1* 6.8*  --  7.1* 6.6* 7.4*  MG 2.1  --  1.6*  --  2.2 1.9 1.9  PHOS 3.1  --  3.0 3.0 1.6* 2.6 1.5*   GFR: Estimated Creatinine Clearance: 35.9 mL/min (by C-G formula based on SCr of 0.74 mg/dL). Liver Function Tests: Recent Labs  Lab 11/11/23 2038 11/12/23 1259 11/13/23 0632 11/14/23 0324 11/15/23 0327 11/16/23 1020  AST 18 19  --  23 26 37   ALT 15 14  --  7 8 11  ALKPHOS 25* 20*  --  16* 18* 23*  BILITOT 0.5 0.8  --  0.7 0.8 1.0  PROT 5.2* 4.5*  --  3.8* 3.9* 4.3*  ALBUMIN  2.8* 2.4* 2.6* 2.4* 2.3* 2.4*   No results for input(s): LIPASE, AMYLASE in the last 168 hours. No results for input(s): AMMONIA in the last 168 hours. Coagulation Profile: Recent Labs  Lab 11/11/23 2038  INR 1.0   Cardiac Enzymes: Recent Labs  Lab 11/11/23 2038  CKTOTAL 111   BNP (last 3 results) No results for input(s): PROBNP in the last 8760 hours. HbA1C: No results for input(s): HGBA1C in the last 72 hours. CBG: No results for input(s): GLUCAP in the last 168 hours. Lipid Profile: No results for input(s): CHOL, HDL, LDLCALC, TRIG, CHOLHDL, LDLDIRECT in the last 72 hours. Thyroid Function Tests: No results for input(s): TSH, T4TOTAL, FREET4, T3FREE, THYROIDAB in the last 72 hours. Anemia Panel: No results for input(s): VITAMINB12, FOLATE, FERRITIN, TIBC, IRON, RETICCTPCT in the last 72 hours. Sepsis Labs: Recent Labs  Lab 11/13/23 0981  LATICACIDVEN <0.3*    Recent Results (from the past 240 hours)  Urine Culture (for pregnant, neutropenic or urologic patients or patients with an indwelling urinary catheter)     Status: Abnormal   Collection Time: 11/12/23 12:12 PM   Specimen: Urine, Catheterized  Result Value Ref Range Status   Specimen Description   Final    URINE, CATHETERIZED Performed at [facility], [address], [place], [place] [address]    Special Requests   Final    NONE Performed at [facility], [address], [place], [place] [address]    Culture (A)  Final    10,000 COLONIES/mL ESCHERICHIA COLI Confirmed Extended Spectrum Beta-Lactamase Producer (ESBL).  In bloodstream infections from ESBL organisms, carbapenems are preferred over piperacillin/tazobactam. They are shown to have a lower risk of mortality.    Report Status  11/14/2023 FINAL  Final   Organism ID, Bacteria ESCHERICHIA COLI (A)  Final      Susceptibility   Escherichia coli - MIC*    AMPICILLIN >=32 RESISTANT Resistant     CEFAZOLIN  >=64 RESISTANT Resistant     CEFEPIME 16 RESISTANT Resistant     CEFTRIAXONE  >=64 RESISTANT Resistant     CIPROFLOXACIN  >=4 RESISTANT Resistant     GENTAMICIN <=1 SENSITIVE Sensitive     IMIPENEM <=0.25 SENSITIVE Sensitive     NITROFURANTOIN <=16 SENSITIVE Sensitive     TRIMETH/SULFA 80 RESISTANT Resistant     AMPICILLIN/SULBACTAM >=32 RESISTANT Resistant     PIP/TAZO <=4 SENSITIVE Sensitive ug/mL    * 10,000 COLONIES/mL ESCHERICHIA COLI  Culture, blood (Routine X 2) w Reflex to ID Panel     Status: None (Preliminary result)   Collection Time: 11/12/23 12:53 PM   Specimen: BLOOD LEFT HAND  Result Value Ref Range Status   Specimen Description   Final    BLOOD LEFT HAND Performed at [facility], [address], [place], [place] [address]    Special Requests   Final    BOTTLES DRAWN AEROBIC AND ANAEROBIC Blood Culture adequate volume Performed at [facility], [address] [place], [place] [address]    Culture   Final    NO GROWTH 4 DAYS Performed at [facility], [address], [place], [place] [address]    Report Status PENDING  Incomplete  Culture, blood (Routine X 2) w Reflex to ID Panel  Status: Abnormal   Collection Time: 11/12/23 12:59 PM   Specimen: BLOOD  Result Value Ref Range Status   Specimen Description   Final    BLOOD Performed at [facility], [address], [place], [place] [address]    Special Requests   Final    BOTTLES DRAWN AEROBIC AND ANAEROBIC Blood Culture adequate volume Performed at [facility], [address] [place], [place] [address]    Culture  Setup Time   Final    GRAM NEGATIVE RODS ANAEROBIC BOTTLE ONLY PHARMD E JACKSON 11/14/2023 @ 0221 BY AB    Culture (A)  Final    BACTEROIDES UNIFORMIS BETA  LACTAMASE POSITIVE Performed at [facility], [address], [place], [place] [address]    Report Status 11/15/2023 FINAL  Final  Blood Culture ID Panel (Reflexed)     Status: None   Collection Time: 11/12/23 12:59 PM  Result Value Ref Range Status   Enterococcus faecalis NOT DETECTED NOT DETECTED Final   Enterococcus Faecium NOT DETECTED NOT DETECTED Final   Listeria monocytogenes NOT DETECTED NOT DETECTED Final   Staphylococcus species NOT DETECTED NOT DETECTED Final   Staphylococcus aureus (BCID) NOT DETECTED NOT DETECTED Final   Staphylococcus epidermidis NOT DETECTED NOT DETECTED Final   Staphylococcus lugdunensis NOT DETECTED NOT DETECTED Final   Streptococcus species NOT DETECTED NOT DETECTED Final   Streptococcus agalactiae NOT DETECTED NOT DETECTED Final   Streptococcus pneumoniae NOT DETECTED NOT DETECTED Final   Streptococcus pyogenes NOT DETECTED NOT DETECTED Final   A.calcoaceticus-baumannii NOT DETECTED NOT DETECTED Final   Bacteroides fragilis NOT DETECTED NOT DETECTED Final   Enterobacterales NOT DETECTED NOT DETECTED Final   Enterobacter cloacae complex NOT DETECTED NOT DETECTED Final   Escherichia coli NOT DETECTED NOT DETECTED Final   Klebsiella aerogenes NOT DETECTED NOT DETECTED Final   Klebsiella oxytoca NOT DETECTED NOT DETECTED Final   Klebsiella pneumoniae NOT DETECTED NOT DETECTED Final   Proteus species NOT DETECTED NOT DETECTED Final   Salmonella species NOT DETECTED NOT DETECTED Final   Serratia marcescens NOT DETECTED NOT DETECTED Final   Haemophilus influenzae NOT DETECTED NOT DETECTED Final   Neisseria meningitidis NOT DETECTED NOT DETECTED Final   Pseudomonas aeruginosa NOT DETECTED NOT DETECTED Final   Stenotrophomonas maltophilia NOT DETECTED NOT DETECTED Final   Candida albicans NOT DETECTED NOT DETECTED Final   Candida auris NOT DETECTED NOT DETECTED Final   Candida glabrata NOT DETECTED NOT DETECTED Final   Candida krusei NOT DETECTED  NOT DETECTED Final   Candida parapsilosis NOT DETECTED NOT DETECTED Final   Candida tropicalis NOT DETECTED NOT DETECTED Final   Cryptococcus neoformans/gattii NOT DETECTED NOT DETECTED Final    Comment: Performed at [facility], [address] [place], [place] [address]  MRSA Next Gen by PCR, Nasal     Status: None   Collection Time: 11/12/23 10:48 PM   Specimen: Nasal Mucosa; Nasal Swab  Result Value Ref Range Status   MRSA by PCR Next Gen NOT DETECTED NOT DETECTED Final    Comment: (NOTE) The GeneXpert MRSA Assay (FDA approved for NASAL specimens only), is one component of a comprehensive MRSA colonization surveillance program. It is not intended to diagnose MRSA infection nor to guide or monitor treatment for MRSA infections. Test performance is not FDA approved in patients less than 63 years old. Performed at [facility], [address], [place], [place] [address]   C Difficile Quick Screen  w PCR reflex     Status: None   Collection Time: 11/13/23  7:50 PM   Specimen: STOOL  Result Value Ref Range Status   C Diff antigen NEGATIVE NEGATIVE Final   C Diff toxin NEGATIVE NEGATIVE Final   C Diff interpretation No C. difficile detected.  Final    Comment: Performed at [facility], [address], [place], [place] [address]  Gastrointestinal Panel by PCR , Stool     Status: None   Collection Time: 11/14/23 10:29 AM   Specimen: Stool  Result Value Ref Range Status   Campylobacter species NOT DETECTED NOT DETECTED Final   Plesimonas shigelloides NOT DETECTED NOT DETECTED Final   Salmonella species NOT DETECTED NOT DETECTED Final   Yersinia enterocolitica NOT DETECTED NOT DETECTED Final   Vibrio species NOT DETECTED NOT DETECTED Final   Vibrio cholerae NOT DETECTED NOT DETECTED Final   Enteroaggregative E coli (EAEC) NOT DETECTED NOT DETECTED Final   Enteropathogenic E coli (EPEC) NOT DETECTED NOT DETECTED Final   Enterotoxigenic E  coli (ETEC) NOT DETECTED NOT DETECTED Final   Shiga like toxin producing E coli (STEC) NOT DETECTED NOT DETECTED Final   Shigella/Enteroinvasive E coli (EIEC) NOT DETECTED NOT DETECTED Final   Cryptosporidium NOT DETECTED NOT DETECTED Final   Cyclospora cayetanensis NOT DETECTED NOT DETECTED Final   Entamoeba histolytica NOT DETECTED NOT DETECTED Final   Giardia lamblia NOT DETECTED NOT DETECTED Final   Adenovirus F40/41 NOT DETECTED NOT DETECTED Final   Astrovirus NOT DETECTED NOT DETECTED Final   Norovirus GI/GII NOT DETECTED NOT DETECTED Final   Rotavirus A NOT DETECTED NOT DETECTED Final   Sapovirus (I, II, IV, and V) NOT DETECTED NOT DETECTED Final    Comment: Performed at [facility], [address] [place], [place] [address]    Radiology Studies: No results found.  Scheduled Meds:  acetaminophen   1,000 mg Oral Q6H   aspirin   81 mg Oral BID   Chlorhexidine  Gluconate Cloth  6 each Topical Daily   dorzolamide -timolol   1 drop Both Eyes BID   famotidine   20 mg Oral Daily   feeding supplement  237 mL Oral BID BM   latanoprost   1 drop Both Eyes QHS   metroNIDAZOLE   500 mg Oral Q12H   [START ON 11/17/2023] midodrine   5 mg Oral TID WC   multivitamin with minerals  1 tablet Oral Daily   pantoprazole   40 mg Oral Daily   simvastatin   10 mg Oral q1800   trimethoprim   100 mg Oral QPM   Continuous Infusions:  sodium chloride      cefTRIAXone  (ROCEPHIN )  IV Stopped (11/16/23 1050)   sodium PHOSPHATE  IVPB (in mmol) 15 mmol (11/16/23 1717)    LOS: 5 days   Aura Leeds, DO [facility] Available via Epic secure chat 7am-7pm After these hours, please refer to coverage provider listed on [email or web address] 11/16/2023, 5:42 PM

## 2023-11-16 NOTE — Plan of Care (Signed)

## 2023-11-16 NOTE — Progress Notes (Signed)
 Inpatient Rehab Admissions:  Inpatient Rehab Consult received.  I spoke with patient on the telephone for rehabilitation assessment and to discuss goals and expectations of an inpatient rehab admission.  Discussed average length of stay, insurance authorization requirement and discharge home after completion of CIR. Pt acknowledged understanding. Pt informed AC that she will not have 24/7 support after discharge. Discussed pt's dispo and goals with physiatrist to help determine if pt appropriate for CIR. It is felt that pt's dispo is not indicative of a safe return home and also highly unlikely that insurance will approve CIR with pt's diagnosis.  Attempted to contact pt again but no one answered the phone. Will inform TOC when able.   Signed: Artemus Larsen, MS, CCC-SLP Admissions Coordinator [phone]

## 2023-11-17 DIAGNOSIS — Z66 Do not resuscitate: Secondary | ICD-10-CM | POA: Diagnosis not present

## 2023-11-17 DIAGNOSIS — S72001P Fracture of unspecified part of neck of right femur, subsequent encounter for closed fracture with malunion: Secondary | ICD-10-CM | POA: Diagnosis not present

## 2023-11-17 DIAGNOSIS — D649 Anemia, unspecified: Secondary | ICD-10-CM | POA: Diagnosis not present

## 2023-11-17 DIAGNOSIS — I10 Essential (primary) hypertension: Secondary | ICD-10-CM | POA: Diagnosis not present

## 2023-11-17 LAB — COMPREHENSIVE METABOLIC PANEL WITH GFR
ALT: 13 U/L (ref 0–44)
AST: 34 U/L (ref 15–41)
Albumin: 2.2 g/dL — ABNORMAL LOW (ref 3.5–5.0)
Alkaline Phosphatase: 22 U/L — ABNORMAL LOW (ref 38–126)
Anion gap: 6 (ref 5–15)
BUN: 10 mg/dL (ref 8–23)
CO2: 23 mmol/L (ref 22–32)
Calcium: 7.2 mg/dL — ABNORMAL LOW (ref 8.9–10.3)
Chloride: 108 mmol/L (ref 98–111)
Creatinine, Ser: 0.61 mg/dL (ref 0.44–1.00)
GFR, Estimated: >60 mL/min (ref >60)
Glucose, Bld: 90 mg/dL (ref 70–99)
Potassium: 4 mmol/L (ref 3.5–5.1)
Sodium: 137 mmol/L (ref 135–145)
Total Bilirubin: 1.3 mg/dL — ABNORMAL HIGH (ref 0.0–1.2)
Total Protein: 4.1 g/dL — ABNORMAL LOW (ref 6.5–8.1)

## 2023-11-17 LAB — CULTURE, BLOOD (ROUTINE X 2)
Culture: NO GROWTH
Special Requests: ADEQUATE

## 2023-11-17 LAB — CBC WITH DIFFERENTIAL/PLATELET
Abs Immature Granulocytes: 0.05 10*3/uL (ref 0.00–0.07)
Basophils Absolute: 0 10*3/uL (ref 0.0–0.1)
Basophils Relative: 1 %
Eosinophils Absolute: 0.4 10*3/uL (ref 0.0–0.5)
Eosinophils Relative: 8 %
HCT: 28.4 % — ABNORMAL LOW (ref 36.0–46.0)
Hemoglobin: 9 g/dL — ABNORMAL LOW (ref 12.0–15.0)
Immature Granulocytes: 1 %
Lymphocytes Relative: 18 %
Lymphs Abs: 1 10*3/uL (ref 0.7–4.0)
MCH: 30.5 pg (ref 26.0–34.0)
MCHC: 31.7 g/dL (ref 30.0–36.0)
MCV: 96.3 fL (ref 80.0–100.0)
Monocytes Absolute: 0.7 10*3/uL (ref 0.1–1.0)
Monocytes Relative: 12 %
Neutro Abs: 3.4 10*3/uL (ref 1.7–7.7)
Neutrophils Relative %: 60 %
Platelets: 161 10*3/uL (ref 150–400)
RBC: 2.95 MIL/uL — ABNORMAL LOW (ref 3.87–5.11)
RDW: 16.4 % — ABNORMAL HIGH (ref 11.5–15.5)
WBC: 5.6 10*3/uL (ref 4.0–10.5)
nRBC: 0 % (ref 0.0–0.2)

## 2023-11-17 LAB — PHOSPHORUS: Phosphorus: 2.5 mg/dL (ref 2.5–4.6)

## 2023-11-17 LAB — MAGNESIUM: Magnesium: 1.9 mg/dL (ref 1.7–2.4)

## 2023-11-17 MED ORDER — MIDODRINE HCL 2.5 MG PO TABS
2.5000 mg | ORAL_TABLET | Freq: Three times a day (TID) | ORAL | Status: DC
  Administered 2023-11-17 – 2023-11-20 (×10): 2.5 mg via ORAL
  Filled 2023-11-17 (×11): qty 1

## 2023-11-17 MED ORDER — FUROSEMIDE 10 MG/ML IJ SOLN
20.0000 mg | Freq: Once | INTRAMUSCULAR | Status: AC
  Administered 2023-11-17: 20 mg via INTRAVENOUS
  Filled 2023-11-17: qty 2

## 2023-11-17 NOTE — Consult Note (Signed)
 Palliative Care Consultation Note  Reason for Consult: Goals of Care, Poor family support. Patient inquiring about EOL planning  Janet Cooper is a 89 year old with a history of hypertension and hyperlipidemia and reportedly history of ovarian cancer for which she had abdominal radiation in the 1980s for which she struggles with bowel issues and fecal incontinence chronically and has had multiple specialist GI workups, who was admitted status post fall with hip fracture, with a complicated postop trajectory including severe acute blood loss anemia, hypotension, dyspnea nausea and vomiting.  Yesterday patient articulated to RN that she wanted to take the pill that helps you die.  She tells me that she has no living relatives except for distant relatives in [place].  Her husband died several years ago as well as her daughter and since that time she has been alone however independent and takes care of a large home and land with minimal assistance.  She reports that she has close neighbors who do help her when needed.  She has high levels of anxiety about how she will care for herself as her health declines.  She also recognizes that a hip fracture is something that we will change her prior functional status and she is nervous about her outcome.  Today during my visit she does not complain of pain just stiffness and soreness in her hip.  Her major complaint is actually related to shortness of breath and nausea.  On exam she is clearly orthopneic when I walked in the room she quickly had to sit up and she appeared dyspneic with tachypnea and distress she was holding her chest and saying she was having a difficult time breathing.  This episode her vital signs and O2 saturations remain normal.  She also complained of intense nausea and asked me for an emesis bag.  Given the symptoms I requested that a twelve-lead EKG be done and 1 set of troponins ordered as well as a BNP.  I was concerned that there could have  been a cardiac etiology or that she may have residual pulmonary edema from her volume overload postoperatively.  She also does have high levels of anxiety which could be playing a role in her chronic symptoms.  In terms of her goals of care she is very clear: She does not want her life prolonged under any circumstances in a debilitated dependent state of health.  She recognizes that she may have to go to a skilled nursing rehab facility to attempt to improve her mobility and functional status following her hip fracture in this hospitalization.  She tells me she is unsure of what to expect.  She wants to avoid hospitalization if possible and would like for what ever her condition allow for a natural death to occur.  She further tells me that she has made all of her arrangements in the event that she dies that she has left clear instructions with her neighbors and living family in [place].  Recommendations:  DNR, Allow for a Natural Death, Avoid Re-hospitalization In terms of her symptom management, she has chronic nausea and gastrointestinal symptoms-this appears to be sequela of having complete pelvic and abdominal radiation done back in the 1980s she says that she has struggled with this for many many years, she has fecal incontinence that has been very difficult to manage, she tells me she has seen multiple GI specialist for this but no one can figure out what her issue is and how to treat it.  We discussed things like  a bowel regimen and probiotics to support gastrointestinal health. In terms of her care coordination and disposition planning: It seems reasonable to have palliative care follow her at skilled rehab given her low levels of community support and family support.  She has not been able to work with PT due to dyspnea I am concerned about the etiology of that symptom and how it may impair her ability to recover.  Our team will continue to follow her on the inpatient side for any changes in her  condition or decompensation.  Again she chooses for minimal intervention in the event of life-threatening or critical illness. Will continue to support and follow as needed.   Flora Humphreys, DO Palliative Medicine  Time:75 minutes

## 2023-11-17 NOTE — TOC Progression Note (Signed)
 Transition of Care [facility]) - Progression Note    Patient Details  Name: Janet Cooper MRN: [id number] Date of Birth: 12-Jul-1929  [facility] CM/SW Contact  Jonni Nettle, LCSW Phone Number: 11/17/2023, 1:22 PM  Clinical Narrative:    PT recommended inpatient rehab. Per Inpatient Rehab Admissions Coordinator, Artemus Larsen, SNF might be better option for pt due to insurance unlikely to cover CIR. CSW completed FL2 and sent referrals to SNF in [place] and surrounding areas. Awaiting bed offers. TOC will continue to follow.    Expected Discharge Plan: Home w Home Health Services Barriers to Discharge: Continued Medical Work up  Expected Discharge Plan and Services SNF   Social Determinants of Health (SDOH) Interventions SDOH Screenings   Food Insecurity: Patient Unable To Answer (11/13/2023)  Housing: Patient Unable To Answer (11/13/2023)  Utilities: Patient Unable To Answer (11/13/2023)  Social Connections: Patient Unable To Answer (11/13/2023)  Tobacco Use: Low Risk  (11/12/2023)    Readmission Risk Interventions    11/14/2023   11:13 AM  Readmission Risk Prevention Plan  Post Dischage Appt Complete  Medication Screening Complete  Transportation Screening Complete    [place], MSW, LCSW 11/17/2023 1:53 PM

## 2023-11-17 NOTE — Progress Notes (Signed)
 PROGRESS NOTE    Janet Cooper  NID:[id number] DOB: [birth year] DOA: 11/11/2023 PCP: Lory Rough., PA-C   Brief Narrative:  The patient is an 89 year old female history of hypertension, hyperlipidemia presenting to the ED while was walking down 2 steps into her garage and landed on her right side with significant right hip pain. Imaging done consistent with right hip fracture.  She underwent surgical intervention but was also noted to be hypotensive.  Antihypertensives were held and she is typed and screened transfuse 1 unit PRBCs. Transferred to the ICU. CCM was consulted given her hypotension she had to be placed on low-dose Neo-Synephrine which has been weaned off.  Hemoglobin is stable now. PT/OT was recommending CIR but unlike to be approved so now recommendation is for SNF. GI Pathogen Panel Negative so will add Antidiarrheals and diarrhea is slowly improving.. BP improving and will CTM. Improving slowly and transferred to Telemetry 11/16/23.  Now complaining of some shortness of breath, so will stop IVF and obtain a chest x-ray.  Assessment and Plan:  Right Intertrochanteric Femoral Fracture Secondary to mechanical fall:  Patient noted to be hypotensive and have low blood pressure which she states is at baseline and on antihypertensive medications. Patient states over the past few days has been having feelings of motion sickness and just not feeling too well with some nausea. Patient seen in consultation by orthopedics and patient underwent Surgical Repair 11/12/23. PT/OT to evaluate and treat and recommending CIR but given her lack of support after discharge the CIR team feels it is unlikely that her insurance will approve admission so they are recommending alternative venues in now SNF is being considered.   Hypotension with shock: IMPROVED. Patient noted with systolic blood pressures in the 70s to 80s on presentation to the ED however patient alert oriented and answering questions  appropriately.  Shock is likely multifactorial given her blood loss, pain meds, Bacteremia and antihypertensives; patient states on antihypertensive medications which she was told to continue per PCP despite low/soft blood pressure. Given IVF bolus and Resuscitation. - IV albumin  25 g x 2. Had to be placed on Short term Pressors. IVF now stopped.  Discontinue antihypertensive medications and likely will not resume on discharge.  Started her on midodrine  and will continue; Was increased to 10 mg 3 times daily but cut back to 5 mg TID given improvement in BP and will now further cut back to 2.5 mg po TID.  -Checking ECHOCardiogram and showed an EF of 65-70% w/ no RWMA; Left ventricular diastolic parameters are indeterminate. And RVSF was normal -Checked a UA and not very impressive but Urine Cx showing 10,000 CFU of ESBL E Coli. Blood Cx as below. Patient noted with an anemia will transfuse 1 unit PRBCs. Will check Cortisol Level in the AM and was 5.8. PCCM was consulted but signed off the case.  -Continue monitor blood pressures per protocol and last blood pressure was improved at 132/73 -Palliative Care consulted for GOC Discussion  GNR Bacteremia: 1/4 Bottles +. C/w IV Ceftriaxone  and Flagyl  for now. Nothing was positive on BCID but likely Anaerobe and was showing Bacteroides Uniformis that was Beta Lactamase positive. CTM and repeat Blood Cx in the AM  Nausea and Diarrhea: Improving.  C Diff and GI Pathogen Panel Negative. C/w Supportive Care and Antidiarrheals w/ Loperamide. IVF as below to stop. Consulted Palliative Care Medicine given her frustration w/ Diarrhea but it has been chronic over 10-15 years. She has had Fecal Incontinence for a  little while now   Dyspnea and SOB: Stop IVF and Checked CXR which showed Small bilateral pleural effusions, left greater than right and  Dense left lower lobe consolidation, which may reflect atelectasis or pneumonia. She is now +617 mLiters since Admission. Will  add Xopenex/Atrovent PRN. BNP was 472.9. Given a dose of IV Lasix 20 mg and IVF stopped.  Repeat chest x-ray in the a.m.   Essential Hypertension: Hold antihypertensive medications secondary to problem #2. Patient noted to have low blood pressure on presentation, states BP has been low/soft with systolics in the 100s however was told to continue her antihypertensive medications per her PCP. Will discontinue antihypertensive medications and NOT resume on discharge. BP is improved and IVF now stopping. Last BP reading was 116/69   Normocytic Anemia: Patient noted to have a drop in hemoglobin of 8.2 from 12.0 on admission. Patient with no overt bleeding but FOBT is Positive, however her skin around her rectum is so broken down though; Per nurse her stool doesn't look like it has any blood in it but the skin around is bloody when she is cleaned. Underwent surgical intervention.  Anemia panel done and showed an iron level of 99, UIBC of 78, TIBC 169, saturation ratios of 58%, ferritin of 135, folate 15.2 and vitamin B12 105. Typed and screened and Transfused 1 unit of PRBCs due to presentation with hypotension. CTM for S/Sx of Bleeding. Hgb/Hct Trend: Recent Labs  Lab 11/12/23 1259 11/12/23 2021 11/13/23 6578 11/14/23 0324 11/15/23 0327 11/16/23 1020 11/17/23 0337  HGB 8.2* 7.1* 10.0* 8.0* 8.3* 10.1* 9.0*  HCT 25.3* 22.8* 30.3* 23.9* 25.0* 30.4* 28.4*  MCV 100.4* 104.6* 94.7 92.6 94.0 95.0 96.3  -Repeat CBC in the AM   Thrombocytopenia: Improved. Platelet Count is now went from 162 -> 108 -> 101 -> 84 -> 100 -> 159 -> 161. CTM for S/Sx of Bleeding; no overt bleeding noted. Repeat CBC in the AM   Metabolic Acidosis: Improved. Now has a CO2 of 23, AG of 6, Chloride Level of 108. IVF now stopped. CTM and Trend and repeat CMP in the AM   Hyperlipidemia: C/w Simvastatin  10 mg po Daily  Hypokalemia: K+ is now 4.0.CTM and Replete as Necessary. Repeat CMP in the AM  Hypophosphatemia: Phos Level is now  2.5. CTM and Replete as Necessary. Repeat CMP in the AM  Hypomagnesemia: Mag Level is now 1.9. CTM and Replete as Necessary. Repeat Mag Level in the AM  Hyperbilirubinemia: Mild. T Bili went from 0.8 -> 1.0 -> 1.3. CTM and Trend and repeat CMP in the AM  GERD/GI Prophylaxis: PCCM has started her on Famotidine  20 mg po Daily   Hypoalbuminemia: Patient's Albumin  Level is now 2.2. CTM and Trend and repeat CMP in the AM   DVT prophylaxis: SCDs Start: 11/11/23 2346    Code Status: Limited: Do not attempt resuscitation (DNR) -DNR-LIMITED -Do Not Intubate/DNI  Family Communication: No family present @ bedside  Disposition Plan:  Level of care: Telemetry Status is: Inpatient Remains inpatient appropriate because: Needs to go to SNF and needs insurance authorization    Consultants:  Orthopedic Surgery PCCM/Pulmonary Palliative Care Medicine   Procedures:  As delineated as above   Antimicrobials:  Anti-infectives (From admission, onward)    Start     Dose/Rate Route Frequency Ordered Stop   11/14/23 1000  metroNIDAZOLE  (FLAGYL ) tablet 500 mg        500 mg Oral Every 12 hours 11/14/23 0833     11/14/23  0900  cefTRIAXone  (ROCEPHIN ) 2 g in sodium chloride  0.9 % 100 mL IVPB        2 g 200 mL/hr over 30 Minutes Intravenous Daily 11/14/23 0815     11/12/23 2200  ceFAZolin  (ANCEF ) IVPB 2g/100 mL premix        2 g 200 mL/hr over 30 Minutes Intravenous Every 6 hours 11/12/23 1735 11/13/23 0622   11/12/23 1800  trimethoprim  (TRIMPEX ) tablet 100 mg        100 mg Oral Every evening 11/12/23 0923     11/12/23 1400  ceFAZolin  (ANCEF ) IVPB 2g/100 mL premix        2 g 200 mL/hr over 30 Minutes Intravenous On call to O.R. 11/12/23 1357 11/12/23 1614       Subjective: Seen and examined at bedside and was doing okay today.  States that she is not having much diarrhea today.  Has not been up yet.  Asking for diet whenever she does get up with therapy.  No nausea or vomiting.  Feels okay.  Denies  any other concerns or complaints at this time.  Objective: Vitals:   11/16/23 2146 11/17/23 0211 11/17/23 0558 11/17/23 1347  BP: 115/73 114/69 132/78 116/69  Pulse: 62 67 69 70  Resp: 17 17 17 16   Temp: 98.3 F (36.8 C) 98.2 F (36.8 C) 98 F (36.7 C) 98.4 F (36.9 C)  TempSrc:   Oral   SpO2: 99% 99% 98% 100%  Weight:      Height:        Intake/Output Summary (Last 24 hours) at 11/17/2023 2115 Last data filed at 11/17/2023 1733 Gross per 24 hour  Intake 220 ml  Output 2850 ml  Net -2630 ml   Filed Weights   11/12/23 1411 11/15/23 0340  Weight: 48.1 kg 56 kg   Examination: Physical Exam:  Constitutional: Thin elderly Caucasian female who is appears calm today Respiratory: Diminished to auscultation bilaterally with some coarse breath sounds, no wheezing, rales, rhonchi or crackles. Normal respiratory effort and patient is not tachypenic. No accessory muscle use.  Unlabored breathing Cardiovascular: RRR, no murmurs / rubs / gallops. S1 and S2 auscultated. No extremity edema. .  Abdomen: Soft, non-tender, non-distended. Bowel sounds positive.  GU: Deferred. Musculoskeletal: No clubbing / cyanosis of digits/nails. No joint deformity upper and lower extremities.  Skin: No rashes, lesions, ulcers on limited skin evaluation. No induration; Warm and dry.  Neurologic: CN 2-12 grossly intact with no focal deficits. Romberg sign cerebellar reflexes not assessed.  Psychiatric: Normal judgment and insight. Alert and oriented x 3.   Data Reviewed: I have personally reviewed following labs and imaging studies  CBC: Recent Labs  Lab 11/11/23 2038 11/12/23 1259 11/13/23 0632 11/14/23 0324 11/15/23 0327 11/16/23 1020 11/17/23 0337  WBC 14.4*   < > 8.5 5.3 5.4 6.1 5.6  NEUTROABS 12.4*  --   --  3.5 3.4 4.0 3.4  HGB 12.0   < > 10.0* 8.0* 8.3* 10.1* 9.0*  HCT 36.9   < > 30.3* 23.9* 25.0* 30.4* 28.4*  MCV 97.6   < > 94.7 92.6 94.0 95.0 96.3  PLT 179   < > 101* 84* 100* 159 161    < > = values in this interval not displayed.   Basic Metabolic Panel: Recent Labs  Lab 11/13/23 0632 11/13/23 0633 11/14/23 0324 11/15/23 0327 11/16/23 1020 11/17/23 0337  NA 138  --  137 135 137 137  K 3.0*  --  3.9 3.1* 4.1  4.0  CL 114*  --  112* 103 105 108  CO2 13*  --  19* 26 25 23   GLUCOSE 117*  --  120* 109* 160* 90  BUN 15  --  18 12 8 10   CREATININE 0.84  --  0.76 0.61 0.74 0.61  CALCIUM  6.8*  --  7.1* 6.6* 7.4* 7.2*  MG 1.6*  --  2.2 1.9 1.9 1.9  PHOS 3.0 3.0 1.6* 2.6 1.5* 2.5   GFR: Estimated Creatinine Clearance: 35.9 mL/min (by C-G formula based on SCr of 0.61 mg/dL). Liver Function Tests: Recent Labs  Lab 11/12/23 1259 11/13/23 0632 11/14/23 0324 11/15/23 0327 11/16/23 1020 11/17/23 0337  AST 19  --  23 26 37 34  ALT 14  --  7 8 11 13   ALKPHOS 20*  --  16* 18* 23* 22*  BILITOT 0.8  --  0.7 0.8 1.0 1.3*  PROT 4.5*  --  3.8* 3.9* 4.3* 4.1*  ALBUMIN  2.4* 2.6* 2.4* 2.3* 2.4* 2.2*   No results for input(s): LIPASE, AMYLASE in the last 168 hours. No results for input(s): AMMONIA in the last 168 hours. Coagulation Profile: Recent Labs  Lab 11/11/23 2038  INR 1.0   Cardiac Enzymes: Recent Labs  Lab 11/11/23 2038  CKTOTAL 111   BNP (last 3 results) No results for input(s): PROBNP in the last 8760 hours. HbA1C: No results for input(s): HGBA1C in the last 72 hours. CBG: No results for input(s): GLUCAP in the last 168 hours. Lipid Profile: No results for input(s): CHOL, HDL, LDLCALC, TRIG, CHOLHDL, LDLDIRECT in the last 72 hours. Thyroid Function Tests: No results for input(s): TSH, T4TOTAL, FREET4, T3FREE, THYROIDAB in the last 72 hours. Anemia Panel: No results for input(s): VITAMINB12, FOLATE, FERRITIN, TIBC, IRON, RETICCTPCT in the last 72 hours. Sepsis Labs: Recent Labs  Lab 11/13/23 8119  LATICACIDVEN <0.3*   Recent Results (from the past 240 hours)  Urine Culture (for pregnant, neutropenic  or urologic patients or patients with an indwelling urinary catheter)     Status: Abnormal   Collection Time: 11/12/23 12:12 PM   Specimen: Urine, Catheterized  Result Value Ref Range Status   Specimen Description   Final    URINE, CATHETERIZED Performed at [facility], [address] [place], [place] [address]    Special Requests   Final    NONE Performed at [facility], [address], [place], [place] [address]    Culture (A)  Final    10,000 COLONIES/mL ESCHERICHIA COLI Confirmed Extended Spectrum Beta-Lactamase Producer (ESBL).  In bloodstream infections from ESBL organisms, carbapenems are preferred over piperacillin/tazobactam. They are shown to have a lower risk of mortality.    Report Status 11/14/2023 FINAL  Final   Organism ID, Bacteria ESCHERICHIA COLI (A)  Final      Susceptibility   Escherichia coli - MIC*    AMPICILLIN >=32 RESISTANT Resistant     CEFAZOLIN  >=64 RESISTANT Resistant     CEFEPIME 16 RESISTANT Resistant     CEFTRIAXONE  >=64 RESISTANT Resistant     CIPROFLOXACIN  >=4 RESISTANT Resistant     GENTAMICIN <=1 SENSITIVE Sensitive     IMIPENEM <=0.25 SENSITIVE Sensitive     NITROFURANTOIN <=16 SENSITIVE Sensitive     TRIMETH/SULFA 80 RESISTANT Resistant     AMPICILLIN/SULBACTAM >=32 RESISTANT Resistant     PIP/TAZO <=4 SENSITIVE Sensitive ug/mL    * 10,000 COLONIES/mL ESCHERICHIA COLI  Culture, blood (Routine X 2) w Reflex to ID Panel  Status: None   Collection Time: 11/12/23 12:53 PM   Specimen: BLOOD LEFT HAND  Result Value Ref Range Status   Specimen Description   Final    BLOOD LEFT HAND Performed at [facility], [address] [place], [place] [address]    Special Requests   Final    BOTTLES DRAWN AEROBIC AND ANAEROBIC Blood Culture adequate volume Performed at [facility], [address] [place], [place] [address]    Culture   Final    NO GROWTH 5 DAYS Performed at [facility], [address], [place], [place] [address]    Report Status 11/17/2023 FINAL  Final  Culture, blood (Routine X 2) w Reflex to ID Panel     Status: Abnormal   Collection Time: 11/12/23 12:59 PM   Specimen: BLOOD  Result Value Ref Range Status   Specimen Description   Final    BLOOD Performed at [facility], [address] [place], [place] [address]    Special Requests   Final    BOTTLES DRAWN AEROBIC AND ANAEROBIC Blood Culture adequate volume Performed at [facility], [address] [place], [place] [address]    Culture  Setup Time   Final    GRAM NEGATIVE RODS ANAEROBIC BOTTLE ONLY PHARMD E JACKSON 11/14/2023 @ 0221 BY AB    Culture (A)  Final    BACTEROIDES UNIFORMIS BETA LACTAMASE POSITIVE Performed at [facility], [address], [place], [place] [address]    Report Status 11/15/2023 FINAL  Final  Blood Culture ID Panel (Reflexed)     Status: None   Collection Time: 11/12/23 12:59 PM  Result Value Ref Range Status   Enterococcus faecalis NOT DETECTED NOT DETECTED Final   Enterococcus Faecium NOT DETECTED NOT DETECTED Final   Listeria monocytogenes NOT DETECTED NOT DETECTED Final   Staphylococcus species NOT DETECTED NOT DETECTED Final   Staphylococcus aureus (BCID) NOT DETECTED NOT DETECTED Final   Staphylococcus epidermidis NOT DETECTED NOT DETECTED Final   Staphylococcus lugdunensis NOT DETECTED NOT DETECTED Final   Streptococcus species NOT DETECTED NOT DETECTED Final   Streptococcus agalactiae NOT DETECTED NOT DETECTED Final   Streptococcus pneumoniae NOT DETECTED NOT DETECTED Final   Streptococcus pyogenes NOT DETECTED NOT DETECTED Final   A.calcoaceticus-baumannii NOT DETECTED NOT DETECTED Final   Bacteroides fragilis NOT DETECTED NOT DETECTED Final   Enterobacterales NOT DETECTED NOT DETECTED Final   Enterobacter cloacae complex NOT DETECTED NOT DETECTED Final   Escherichia coli NOT DETECTED NOT DETECTED Final    Klebsiella aerogenes NOT DETECTED NOT DETECTED Final   Klebsiella oxytoca NOT DETECTED NOT DETECTED Final   Klebsiella pneumoniae NOT DETECTED NOT DETECTED Final   Proteus species NOT DETECTED NOT DETECTED Final   Salmonella species NOT DETECTED NOT DETECTED Final   Serratia marcescens NOT DETECTED NOT DETECTED Final   Haemophilus influenzae NOT DETECTED NOT DETECTED Final   Neisseria meningitidis NOT DETECTED NOT DETECTED Final   Pseudomonas aeruginosa NOT DETECTED NOT DETECTED Final   Stenotrophomonas maltophilia NOT DETECTED NOT DETECTED Final   Candida albicans NOT DETECTED NOT DETECTED Final   Candida auris NOT DETECTED NOT DETECTED Final   Candida glabrata NOT DETECTED NOT DETECTED Final   Candida krusei NOT DETECTED NOT DETECTED Final   Candida parapsilosis NOT DETECTED NOT DETECTED Final   Candida tropicalis NOT DETECTED NOT DETECTED Final   Cryptococcus neoformans/gattii NOT DETECTED NOT DETECTED Final    Comment: Performed  at [facility], [address], [place], [place] [address]  MRSA Next Gen by PCR, Nasal     Status: None   Collection Time: 11/12/23 10:48 PM   Specimen: Nasal Mucosa; Nasal Swab  Result Value Ref Range Status   MRSA by PCR Next Gen NOT DETECTED NOT DETECTED Final    Comment: (NOTE) The GeneXpert MRSA Assay (FDA approved for NASAL specimens only), is one component of a comprehensive MRSA colonization surveillance program. It is not intended to diagnose MRSA infection nor to guide or monitor treatment for MRSA infections. Test performance is not FDA approved in patients less than 9 years old. Performed at [facility], [address] [place], [place] [address]   C Difficile Quick Screen w PCR reflex     Status: None   Collection Time: 11/13/23  7:50 PM   Specimen: STOOL  Result Value Ref Range Status   C Diff antigen NEGATIVE NEGATIVE Final   C Diff toxin NEGATIVE NEGATIVE Final   C Diff interpretation No C. difficile  detected.  Final    Comment: Performed at [facility], [address] [place], [place] [address]  Gastrointestinal Panel by PCR , Stool     Status: None   Collection Time: 11/14/23 10:29 AM   Specimen: Stool  Result Value Ref Range Status   Campylobacter species NOT DETECTED NOT DETECTED Final   Plesimonas shigelloides NOT DETECTED NOT DETECTED Final   Salmonella species NOT DETECTED NOT DETECTED Final   Yersinia enterocolitica NOT DETECTED NOT DETECTED Final   Vibrio species NOT DETECTED NOT DETECTED Final   Vibrio cholerae NOT DETECTED NOT DETECTED Final   Enteroaggregative E coli (EAEC) NOT DETECTED NOT DETECTED Final   Enteropathogenic E coli (EPEC) NOT DETECTED NOT DETECTED Final   Enterotoxigenic E coli (ETEC) NOT DETECTED NOT DETECTED Final   Shiga like toxin producing E coli (STEC) NOT DETECTED NOT DETECTED Final   Shigella/Enteroinvasive E coli (EIEC) NOT DETECTED NOT DETECTED Final   Cryptosporidium NOT DETECTED NOT DETECTED Final   Cyclospora cayetanensis NOT DETECTED NOT DETECTED Final   Entamoeba histolytica NOT DETECTED NOT DETECTED Final   Giardia lamblia NOT DETECTED NOT DETECTED Final   Adenovirus F40/41 NOT DETECTED NOT DETECTED Final   Astrovirus NOT DETECTED NOT DETECTED Final   Norovirus GI/GII NOT DETECTED NOT DETECTED Final   Rotavirus A NOT DETECTED NOT DETECTED Final   Sapovirus (I, II, IV, and V) NOT DETECTED NOT DETECTED Final    Comment: Performed at [facility], [address] [place], [place] [address]    Radiology Studies: DG CHEST PORT 1 VIEW Result Date: 11/16/2023 CLINICAL DATA:  Short of breath, right hip surgery 4 days ago EXAM: PORTABLE CHEST 1 VIEW COMPARISON:  11/11/2023 FINDINGS: Single frontal view of the chest demonstrates mild enlargement the cardiac silhouette. Dense left lower lobe consolidation with small left pleural effusion. Trace right pleural effusion. No pneumothorax. No acute bony abnormalities.  IMPRESSION: 1. Small bilateral pleural effusions, left greater than right. 2. Dense left lower lobe consolidation, which may reflect atelectasis or pneumonia. Electronically Signed   By: Bobbye Burrow M.D.   On: 11/16/2023 19:44   Scheduled Meds:  aspirin   81 mg Oral BID   Chlorhexidine  Gluconate Cloth  6 each Topical Daily   dorzolamide -timolol   1 drop Both Eyes BID   famotidine   20 mg Oral Daily   feeding supplement  237 mL Oral BID BM   latanoprost   1  drop Both Eyes QHS   metroNIDAZOLE   500 mg Oral Q12H   midodrine   2.5 mg Oral TID WC   multivitamin with minerals  1 tablet Oral Daily   pantoprazole   40 mg Oral Daily   simvastatin   10 mg Oral q1800   trimethoprim   100 mg Oral QPM   Continuous Infusions:  sodium chloride      cefTRIAXone  (ROCEPHIN )  IV 2 g (11/17/23 0943)    LOS: 6 days   Aura Leeds, DO [facility] Available via Epic secure chat 7am-7pm After these hours, please refer to coverage provider listed on [email or web address] 11/17/2023, 9:15 PM

## 2023-11-17 NOTE — Progress Notes (Signed)
 Physical Therapy Treatment Patient Details Name: Janet Cooper MRN: [id number] DOB: May 14, 1930 Today's Date: 11/17/2023   History of Present Illness Janet Cooper is a 89 year old female admitted on 11/11/23 s/p fall in her garage down steps sustaining R intertrochanteric femoral fx with R THA anterior approach on 11/12/23. Complications of hypotension, low Hgb,and GNR Bacteremia requiring pressors (short term, now weaned), PRBC, and transfer to ICU. PMH: hypertension, hyperlipidemia    PT Comments  Pt agreeable to working with therapy. O2 95% on RA, HR 85 bpm during session. She continues to report mild dizziness with mobility. Ambulation distance limited by bowel incontinence this session-chronic, pt wears depends. Per chart review, AIR not an option. Will update recommendation to SNF. Patient will benefit from continued inpatient follow up therapy, <3 hours/day to regain PLOF/independence.      If plan is discharge home, recommend the following: A little help with walking and/or transfers;A little help with bathing/dressing/bathroom;Assistance with cooking/housework;Assist for transportation;Help with stairs or ramp for entrance   Can travel by private vehicle        Equipment Recommendations   (TBD at next venue)    Recommendations for Other Services       Precautions / Restrictions Precautions Precautions: Fall Precaution/Restrictions Comments: incontinent Restrictions Weight Bearing Restrictions Per Provider Order: No RLE Weight Bearing Per Provider Order: Weight bearing as tolerated     Mobility  Bed Mobility Overal bed mobility: Needs Assistance Bed Mobility: Supine to Sit     Supine to sit: Mod assist, HOB elevated, Used rails     General bed mobility comments: Assist for R LE and to scoot to EOB. Increased time. Cues provided as needed. Heavy reliance ono bedrail. Pt reported mild dizziness. No LOB with EOB sitting.    Transfers Overall transfer level: Needs  assistance Equipment used: Rolling walker (2 wheels) Transfers: Sit to/from Stand Sit to Stand: Min assist, From elevated surface           General transfer comment: Cues for safety, technique, hand placement. Increased time. Mild dizziness-no overt LOB.    Ambulation/Gait Ambulation/Gait assistance: Min assist Gait Distance (Feet): 10 Feet Assistive device: Rolling walker (2 wheels) Gait Pattern/deviations: Step-through pattern, Decreased stride length       General Gait Details: Remained in room due to chronic bowel incontinence. Assist to steady pt. Pt continued to report dizziness. Not terribly unsteady/no overt LOB however distance was short.   Stairs             Wheelchair Mobility     Tilt Bed    Modified Rankin (Stroke Patients Only)       Balance Overall balance assessment: Needs assistance, History of Falls         Standing balance support: Bilateral upper extremity supported, During functional activity, Reliant on assistive device for balance Standing balance-Leahy Scale: Poor                              Communication Communication Communication: Impaired Factors Affecting Communication: Hearing impaired  Cognition Arousal: Alert Behavior During Therapy: WFL for tasks assessed/performed, Anxious   PT - Cognitive impairments: No apparent impairments                         Following commands: Intact      Cueing Cueing Techniques: Verbal cues  Exercises General Exercises - Lower Extremity Ankle Circles/Pumps: AROM, Both, 10 reps Quad  Sets: AROM, Both, 10 reps Heel Slides: AAROM, Right, 10 reps Hip ABduction/ADduction: AAROM, Right, 10 reps    General Comments        Pertinent Vitals/Pain Pain Assessment Pain Assessment: 0-10 Pain Score: 5  Pain Location: R thigh Pain Descriptors / Indicators: Aching Pain Intervention(s): Limited activity within patient's tolerance, Monitored during session, Repositioned     Home Living                          Prior Function            PT Goals (current goals can now be found in the care plan section) Progress towards PT goals: Progressing toward goals    Frequency    Min 2X/week      PT Plan      Co-evaluation              AM-PAC PT 6 Clicks Mobility   Outcome Measure  Help needed turning from your back to your side while in a flat bed without using bedrails?: A Little Help needed moving from lying on your back to sitting on the side of a flat bed without using bedrails?: A Lot Help needed moving to and from a bed to a chair (including a wheelchair)?: A Little Help needed standing up from a chair using your arms (e.g., wheelchair or bedside chair)?: A Little Help needed to walk in hospital room?: A Little Help needed climbing 3-5 steps with a railing? : A Lot 6 Click Score: 16    End of Session Equipment Utilized During Treatment: Gait belt Activity Tolerance: Patient tolerated treatment well Patient left: in chair;with call bell/phone within reach;with chair alarm set   PT Visit Diagnosis: Other abnormalities of gait and mobility (R26.89);Muscle weakness (generalized) (M62.81);Dizziness and giddiness (R42)     Time: 7829-5621 PT Time Calculation (min) (ACUTE ONLY): 19 min  Charges:    $Gait Training: 8-22 mins PT General Charges $$ ACUTE PT VISIT: 1 Visit                         Tanda Falter, PT Acute Rehabilitation  Office: [phone]

## 2023-11-17 NOTE — Plan of Care (Signed)

## 2023-11-17 NOTE — Progress Notes (Signed)
 Inpatient Rehab Admissions Coordinator:  Spoke with pt on the phone. Discussed that d/t the lack of 24/7 support after discharge and the unlikeliness that insurance would approve a CIR admission, SNF placement should be pursued. TOC made aware.  AC will sign off.   Artemus Larsen, MS, CCC-SLP Admissions Coordinator [phone]

## 2023-11-17 NOTE — Progress Notes (Signed)
 Subjective: 5 Days Post-Op Procedure(s) (LRB): ARTHROPLASTY, HIP, TOTAL, ANTERIOR APPROACH (Right) Patient reports pain as mild in right hip. Eager to get up with PT as she has just sat on side of bed so far  Objective: Vital signs in last 24 hours: Temp:  [97.8 F (36.6 C)-98.8 F (37.1 C)] 98 F (36.7 C) (06/15 0558) Pulse Rate:  [60-73] 69 (06/15 0558) Resp:  [17-25] 17 (06/15 0558) BP: (114-144)/(64-87) 132/78 (06/15 0558) SpO2:  [94 %-100 %] 98 % (06/15 0558)  Intake/Output from previous day: 06/14 0701 - 06/15 0700 In: 676.8 [I.V.:647.8; IV Piggyback:29] Out: 1950 [Urine:1950] Intake/Output this shift: No intake/output data recorded.  Recent Labs    11/15/23 0327 11/16/23 1020 11/17/23 0337  HGB 8.3* 10.1* 9.0*   Recent Labs    11/16/23 1020 11/17/23 0337  WBC 6.1 5.6  RBC 3.20* 2.95*  HCT 30.4* 28.4*  PLT 159 161   Recent Labs    11/16/23 1020 11/17/23 0337  NA 137 137  K 4.1 4.0  CL 105 108  CO2 25 23  BUN 8 10  CREATININE 0.74 0.61  GLUCOSE 160* 90  CALCIUM  7.4* 7.2*   No results for input(s): LABPT, INR in the last 72 hours.  Neurovascular intact No cellulitis present Compartment soft   Assessment/Plan: 5 Days Post-Op Procedure(s) (LRB): ARTHROPLASTY, HIP, TOTAL, ANTERIOR APPROACH (Right)  Up with therapy when stable enough from medical standpoint   Janet Cooper 11/17/2023, 7:10 AM

## 2023-11-17 NOTE — Plan of Care (Signed)

## 2023-11-17 NOTE — NC FL2 (Signed)
 [place]  MEDICAID FL2 LEVEL OF CARE FORM     IDENTIFICATION  Patient Name: Janet Cooper Birthdate: 10-16-1929 Sex: female Admission Date (Current Location): 11/11/2023  [facility] and [place] Number:  Producer, television/film/video and Address:  [facility],  [address], [place] [address]      Provider Number: [id number]  Attending Physician Name and Address:  Eveline Hipps, DO  Relative Name and Phone Number:  Adline Aho [facility])  [phone]    Current Level of Care:   Recommended Level of Care:   Prior Approval Number:    Date Approved/Denied:   PASRR Number: [id number]  Discharge Plan: SNF    Current Diagnoses: Patient Active Problem List   Diagnosis Date Noted   DNR (do not resuscitate) 11/17/2023   Shock (HCC) 11/13/2023   S/P total right hip arthroplasty 11/12/2023   Anemia 11/12/2023   Hypotension 11/12/2023   Intertrochanteric fracture of right hip, closed, initial encounter (HCC) 11/12/2023   Closed right hip fracture (HCC) 11/11/2023   Dizziness 04/13/2023   Sensorineural hearing loss, bilateral 04/13/2023   Acquired scoliosis 08/10/2020   Arthropathy of lumbar facet joint 08/10/2020   Degenerative scoliosis in adult patient 05/20/2020   Chronic low back pain 05/20/2020   Other chronic pain 05/20/2020   Onychomycosis with ingrown toenail 03/22/2020   Onychomycosis of toenail 06/11/2019   BPV (benign positional vertigo), left 02/10/2019   Nausea 02/10/2019   Reactive depression 03/24/2018   IFG (impaired fasting glucose) 10/23/2016   Hyperlipidemia 02/28/2016   Hypertension 02/28/2016   Rectocele 09/16/2013   Neck pain 02/13/2013   Fecal urgency 12/14/2011   Full incontinence of feces 12/14/2011   Mixed stress and urge urinary incontinence 12/14/2011    Orientation RESPIRATION BLADDER Height & Weight     Self, Time, Situation, Place  O2 (2L) Continent Weight: 123 lb 7.3 oz (56 kg) Height:  5' (152.4 cm)  BEHAVIORAL  SYMPTOMS/MOOD NEUROLOGICAL BOWEL NUTRITION STATUS      Incontinent Diet (Regular)  AMBULATORY STATUS COMMUNICATION OF NEEDS Skin   Limited Assist Verbally Surgical wounds (Incision (Closed) 11/12/23 Hip Right)                       Personal Care Assistance Level of Assistance  Bathing, Feeding, Dressing Bathing Assistance: Maximum assistance Feeding assistance: Independent Dressing Assistance: Maximum assistance     Functional Limitations Info  Sight, Hearing, Speech Sight Info: Adequate Hearing Info: Adequate Speech Info: Adequate    SPECIAL CARE FACTORS FREQUENCY  PT (By licensed PT), OT (By licensed OT)     PT Frequency: 5x per week OT Frequency: 5x per week            Contractures Contractures Info: Not present    Additional Factors Info  Code Status, Allergies Code Status Info: DNR Allergies Info: Homatropine, Hydrocodone, Sulfa Antibiotics, Sulfamethoxazole           Current Medications (11/17/2023):  This is the current hospital active medication list Current Facility-Administered Medications  Medication Dose Route Frequency Provider Last Rate Last Admin   0.9 %  sodium chloride  infusion  10 mL/hr Intravenous Once Olin, Matthew, MD       aspirin  chewable tablet 81 mg  81 mg Oral BID Donovan, Ashley R, PA-C   81 mg at 11/17/23 8469   cefTRIAXone  (ROCEPHIN ) 2 g in sodium chloride  0.9 % 100 mL IVPB  2 g Intravenous Daily Aura Leeds Crum, DO 200 mL/hr at 11/17/23 [phone]  2 g at 11/17/23 0943   Chlorhexidine  Gluconate Cloth 2 % PADS 6 each  6 each Topical Daily Earnie Gola, PA-C   6 each at 11/17/23 9604   dorzolamide -timolol  (COSOPT ) 2-0.5 % ophthalmic solution 1 drop  1 drop Both Eyes BID Earnie Gola, PA-C   1 drop at 11/17/23 0932   famotidine  (PEPCID ) tablet 20 mg  20 mg Oral Daily Dewald, Jonathan B, MD   20 mg at 11/17/23 0934   feeding supplement (ENSURE PLUS HIGH PROTEIN) liquid 237 mL  237 mL Oral BID BM Aura Leeds [place], DO   1 Bottle  at 11/17/23 5409   HYDROmorphone  (DILAUDID ) injection 0.5-1 mg  0.5-1 mg Intravenous Q4H PRN Earnie Gola, PA-C       hydrOXYzine (ATARAX) tablet 25 mg  25 mg Oral TID PRN Sheikh, Omair Latif, DO   25 mg at 11/16/23 1738   ipratropium (ATROVENT) nebulizer solution 0.5 mg  0.5 mg Nebulization Q6H PRN Sheikh, Omair Latif, DO   0.5 mg at 11/16/23 1743   latanoprost  (XALATAN ) 0.005 % ophthalmic solution 1 drop  1 drop Both Eyes QHS Earnie Gola, PA-C   1 drop at 11/16/23 2245   levalbuterol (XOPENEX) nebulizer solution 0.63 mg  0.63 mg Nebulization Q6H PRN Sheikh, Omair Latif, DO   0.63 mg at 11/16/23 1743   loperamide (IMODIUM) capsule 2 mg  2 mg Oral PRN Sheikh, Omair Latif, DO   2 mg at 11/15/23 1246   metoCLOPramide  (REGLAN ) tablet 5 mg  5 mg Oral Q8H PRN Earnie Gola, PA-C   5 mg at 11/13/23 8119   Or   metoCLOPramide  (REGLAN ) injection 5 mg  5 mg Intravenous Q8H PRN Earnie Gola, PA-C   5 mg at 11/15/23 1478   metroNIDAZOLE  (FLAGYL ) tablet 500 mg  500 mg Oral Q12H Sheikh, Omair Latif, DO   500 mg at 11/17/23 2956   midodrine  (PROAMATINE ) tablet 2.5 mg  2.5 mg Oral TID WC Sheikh, Omair Latif, DO   2.5 mg at 11/17/23 1214   multivitamin with minerals tablet 1 tablet  1 tablet Oral Daily Sheikh, Omair Latif, [place]   1 tablet at 11/17/23 2130   ondansetron  (ZOFRAN ) tablet 4 mg  4 mg Oral Q6H PRN Earnie Gola, PA-C       Or   ondansetron  (ZOFRAN ) injection 4 mg  4 mg Intravenous Q6H PRN Earnie Gola, PA-C   4 mg at 11/16/23 1419   Oral care mouth rinse  15 mL Mouth Rinse PRN Earnie Gola, PA-C       oxyCODONE  (Oxy IR/ROXICODONE ) immediate release tablet 2.5-5 mg  2.5-5 mg Oral Q4H PRN Earnie Gola, PA-C       oxyCODONE  (Oxy IR/ROXICODONE ) immediate release tablet 5-10 mg  5-10 mg Oral Q4H PRN Earnie Gola, PA-C       pantoprazole  (PROTONIX ) EC tablet 40 mg  40 mg Oral Daily Earnie Gola, PA-C   40 mg at 11/17/23 8657   simvastatin  (ZOCOR ) tablet 10 mg   10 mg Oral Q4696 Earnie Gola, PA-C   10 mg at 11/16/23 1738   trimethoprim  (TRIMPEX ) tablet 100 mg  100 mg Oral QPM Earnie Gola, PA-C   100 mg at 11/16/23 1738     Discharge Medications: Please see discharge summary for a list of discharge medications.  Relevant Imaging Results:  Relevant Lab Results:   Additional Information SSN: [id number]  Jonni Nettle, LCSW

## 2023-11-18 DIAGNOSIS — D649 Anemia, unspecified: Secondary | ICD-10-CM | POA: Diagnosis not present

## 2023-11-18 DIAGNOSIS — Z66 Do not resuscitate: Secondary | ICD-10-CM | POA: Diagnosis not present

## 2023-11-18 DIAGNOSIS — I10 Essential (primary) hypertension: Secondary | ICD-10-CM | POA: Diagnosis not present

## 2023-11-18 DIAGNOSIS — S72001P Fracture of unspecified part of neck of right femur, subsequent encounter for closed fracture with malunion: Secondary | ICD-10-CM | POA: Diagnosis not present

## 2023-11-18 LAB — CBC WITH DIFFERENTIAL/PLATELET
Abs Immature Granulocytes: 0.07 10*3/uL (ref 0.00–0.07)
Basophils Absolute: 0 10*3/uL (ref 0.0–0.1)
Basophils Relative: 1 %
Eosinophils Absolute: 0.4 10*3/uL (ref 0.0–0.5)
Eosinophils Relative: 6 %
HCT: 29.2 % — ABNORMAL LOW (ref 36.0–46.0)
Hemoglobin: 9.7 g/dL — ABNORMAL LOW (ref 12.0–15.0)
Immature Granulocytes: 1 %
Lymphocytes Relative: 18 %
Lymphs Abs: 1.1 10*3/uL (ref 0.7–4.0)
MCH: 31.3 pg (ref 26.0–34.0)
MCHC: 33.2 g/dL (ref 30.0–36.0)
MCV: 94.2 fL (ref 80.0–100.0)
Monocytes Absolute: 0.9 10*3/uL (ref 0.1–1.0)
Monocytes Relative: 15 %
Neutro Abs: 3.8 10*3/uL (ref 1.7–7.7)
Neutrophils Relative %: 59 %
Platelets: 215 10*3/uL (ref 150–400)
RBC: 3.1 MIL/uL — ABNORMAL LOW (ref 3.87–5.11)
RDW: 16.5 % — ABNORMAL HIGH (ref 11.5–15.5)
WBC: 6.2 10*3/uL (ref 4.0–10.5)
nRBC: 0 % (ref 0.0–0.2)

## 2023-11-18 LAB — COMPREHENSIVE METABOLIC PANEL WITH GFR
ALT: 18 U/L (ref 0–44)
AST: 45 U/L — ABNORMAL HIGH (ref 15–41)
Albumin: 2.4 g/dL — ABNORMAL LOW (ref 3.5–5.0)
Alkaline Phosphatase: 25 U/L — ABNORMAL LOW (ref 38–126)
Anion gap: 5 (ref 5–15)
BUN: 17 mg/dL (ref 8–23)
CO2: 25 mmol/L (ref 22–32)
Calcium: 8.1 mg/dL — ABNORMAL LOW (ref 8.9–10.3)
Chloride: 106 mmol/L (ref 98–111)
Creatinine, Ser: 0.65 mg/dL (ref 0.44–1.00)
GFR, Estimated: >60 mL/min (ref >60)
Glucose, Bld: 107 mg/dL — ABNORMAL HIGH (ref 70–99)
Potassium: 4.2 mmol/L (ref 3.5–5.1)
Sodium: 136 mmol/L (ref 135–145)
Total Bilirubin: 1.4 mg/dL — ABNORMAL HIGH (ref 0.0–1.2)
Total Protein: 4.6 g/dL — ABNORMAL LOW (ref 6.5–8.1)

## 2023-11-18 LAB — PHOSPHORUS: Phosphorus: 3.3 mg/dL (ref 2.5–4.6)

## 2023-11-18 LAB — TROPONIN T: Troponin T (Highly Sensitive): 31 ng/L — ABNORMAL HIGH (ref 0–14)

## 2023-11-18 LAB — MAGNESIUM: Magnesium: 2 mg/dL (ref 1.7–2.4)

## 2023-11-18 MED ORDER — AMOXICILLIN-POT CLAVULANATE 875-125 MG PO TABS
1.0000 | ORAL_TABLET | Freq: Two times a day (BID) | ORAL | Status: DC
  Administered 2023-11-18 – 2023-11-20 (×4): 1 via ORAL
  Filled 2023-11-18 (×4): qty 1

## 2023-11-18 MED ORDER — AMOXICILLIN-POT CLAVULANATE 875-125 MG PO TABS: 1.0000 | ORAL_TABLET | Freq: Two times a day (BID) | ORAL | Status: DC

## 2023-11-18 NOTE — Progress Notes (Signed)
 Physical Therapy Treatment Patient Details Name: Janet Cooper MRN: [id number] DOB: 08/15/1929 Today's Date: 11/18/2023   History of Present Illness Janet Cooper is a 89 year old female admitted on 11/11/23 s/p fall in her garage down steps sustaining R intertrochanteric femoral fx with R THA anterior approach on 11/12/23. Complications of hypotension, low Hgb,and GNR Bacteremia requiring pressors (short term, now weaned), PRBC, and transfer to ICU. PMH: hypertension, hyperlipidemia    PT Comments  Pt agreeable to working with therapy. Progressing with mobility. Min-Mod A for mobility on today. Mild dizziness that did not worsen as session progressed. Patient will benefit from continued inpatient follow up therapy, <3 hours/day    If plan is discharge home, recommend the following: A little help with walking and/or transfers;A little help with bathing/dressing/bathroom;Assistance with cooking/housework;Assist for transportation;Help with stairs or ramp for entrance   Can travel by private vehicle        Equipment Recommendations       Recommendations for Other Services       Precautions / Restrictions Precautions Precautions: Fall Precaution/Restrictions Comments: incontinent, monitor vitals Restrictions Weight Bearing Restrictions Per Provider Order: No RLE Weight Bearing Per Provider Order: Weight bearing as tolerated     Mobility  Bed Mobility Overal bed mobility: Needs Assistance Bed Mobility: Sit to Supine       Sit to supine: Mod assist   General bed mobility comments: Assist for LEs onto bed. Increased time.    Transfers Overall transfer level: Needs assistance Equipment used: Rolling walker (2 wheels) Transfers: Sit to/from Stand, Bed to chair/wheelchair/BSC Sit to Stand: Min assist Stand pivot transfers: Min assist         General transfer comment: Cues for safety, technique, hand placement. Increased time. Mild dizziness-no overt LOB.     Ambulation/Gait Ambulation/Gait assistance: Min assist Gait Distance (Feet): 40 Feet Assistive device: Rolling walker (2 wheels) Gait Pattern/deviations: Step-through pattern, Decreased stride length       General Gait Details: mild dizziness reported but did not worsen with activity. pt tolerated distance well. assist to steady throughout distance.   Stairs             Wheelchair Mobility     Tilt Bed    Modified Rankin (Stroke Patients Only)       Balance Overall balance assessment: Needs assistance, History of Falls         Standing balance support: Bilateral upper extremity supported, During functional activity, Reliant on assistive device for balance Standing balance-Leahy Scale: Poor                              Communication Communication Communication: Impaired Factors Affecting Communication: Hearing impaired  Cognition Arousal: Alert Behavior During Therapy: WFL for tasks assessed/performed   PT - Cognitive impairments: No apparent impairments                         Following commands: Intact      Cueing Cueing Techniques: Verbal cues  Exercises      General Comments        Pertinent Vitals/Pain Pain Assessment Pain Assessment: Faces Faces Pain Scale: Hurts little more Pain Descriptors / Indicators: Aching, Discomfort Pain Intervention(s): Limited activity within patient's tolerance, Monitored during session, Repositioned    Home Living  Prior Function            PT Goals (current goals can now be found in the care plan section) Progress towards PT goals: Progressing toward goals    Frequency    Min 2X/week      PT Plan      Co-evaluation              AM-PAC PT 6 Clicks Mobility   Outcome Measure  Help needed turning from your back to your side while in a flat bed without using bedrails?: A Little Help needed moving from lying on your back to  sitting on the side of a flat bed without using bedrails?: A Little Help needed moving to and from a bed to a chair (including a wheelchair)?: A Little Help needed standing up from a chair using your arms (e.g., wheelchair or bedside chair)?: A Little Help needed to walk in hospital room?: A Little Help needed climbing 3-5 steps with a railing? : A Lot 6 Click Score: 17    End of Session Equipment Utilized During Treatment: Gait belt Activity Tolerance: Patient tolerated treatment well Patient left: in bed;with call bell/phone within reach;with bed alarm set   PT Visit Diagnosis: Other abnormalities of gait and mobility (R26.89);Muscle weakness (generalized) (M62.81);Dizziness and giddiness (R42)     Time: 4259-5638 PT Time Calculation (min) (ACUTE ONLY): 25 min  Charges:    $Gait Training: 8-22 mins $Therapeutic Activity: 8-22 mins PT General Charges $$ ACUTE PT VISIT: 1 Visit                         Tanda Falter, PT Acute Rehabilitation  Office: [phone]

## 2023-11-18 NOTE — TOC Progression Note (Signed)
 Transition of Care [facility]) - Progression Note    Patient Details  Name: Janet Cooper MRN: [id number] Date of Birth: 1929/09/19  Transition of Care [facility]) CM/SW Contact  Delilah Fend, LCSW Phone Number: 11/18/2023, 1:54 PM  Clinical Narrative:     Met with pt just to review dc plan for SNF.  Pt confirms this is plan and expressing concern about how long her insurance will cover this.  Addressed her concerns and questions.  Will review bed offers and submit for insurance auth when facility confirmed.  Expected Discharge Plan: Home w Home Health Services Barriers to Discharge: Continued Medical Work up  Expected Discharge Plan and Services                                               Social Determinants of Health (SDOH) Interventions SDOH Screenings   Food Insecurity: Patient Unable To Answer (11/13/2023)  Housing: Patient Unable To Answer (11/13/2023)  Utilities: Patient Unable To Answer (11/13/2023)  Social Connections: Patient Unable To Answer (11/13/2023)  Tobacco Use: Low Risk  (11/12/2023)    Readmission Risk Interventions    11/14/2023   11:13 AM  Readmission Risk Prevention Plan  Post Dischage Appt Complete  Medication Screening Complete  Transportation Screening Complete

## 2023-11-18 NOTE — Progress Notes (Signed)
 Occupational Therapy Treatment Patient Details Name: Janet Cooper MRN: [id number] DOB: 02-17-1930 Today's Date: 11/18/2023   History of present illness Janet Cooper is a 89 year old female admitted on 11/11/23 s/p fall in her garage down steps sustaining R intertrochanteric femoral fx with R THA anterior approach on 11/12/23. Complications of hypotension, low Hgb,and GNR Bacteremia requiring pressors (short term, now weaned), PRBC, and transfer to ICU. PMH: hypertension, hyperlipidemia   OT comments  Patient reporting dizziness with increased time on feet when attempting to complete oral hygiene in standing. Patient's BP seated in recliner was 118/79 mmhg and standing was 101/75 mmhg nurse made aware. Patient making comments about taking pills to die during session with nurse and MD made aware. Patient would continue to benefit from skilled OT services at this time while admitted and after d/c to address noted deficits in order to improve overall safety and independence in ADLs. Patient will benefit from continued inpatient follow up therapy, <3 hours/day.       If plan is discharge home, recommend the following:  A lot of help with walking and/or transfers;A lot of help with bathing/dressing/bathroom;Assistance with cooking/housework;Direct supervision/assist for medications management;Direct supervision/assist for financial management;Assist for transportation;Help with stairs or ramp for entrance   Equipment Recommendations  None recommended by OT       Precautions / Restrictions Precautions Precautions: Fall Precaution/Restrictions Comments: incontinent, monitor vitals Restrictions Weight Bearing Restrictions Per Provider Order: No RLE Weight Bearing Per Provider Order: Weight bearing as tolerated       Mobility Bed Mobility               General bed mobility comments: patient was up in recliner and remained in the same.             ADL either performed or  assessed with clinical judgement   ADL Overall ADL's : Needs assistance/impaired     Grooming: Sitting;Oral care;Set up Grooming Details (indicate cue type and reason): unable to complete in standing with patient reporting dizziness with increased time on feet. nurse made aware of bps.                   Toilet Transfer Details (indicate cue type and reason): declined need to use bathroom expressed concerns about continued feeling of incontinence of BM. patient declining to use bathroom during session.           General ADL Comments: patient reporting that she wants to have the small pill to take so she can die. nurse and MD already made aware.                  Cognition Arousal: Alert Behavior During Therapy: WFL for tasks assessed/performed, Anxious Cognition: No apparent impairments                                           Exercises Other Exercises Other Exercises: sit to stands x3 with cues for proper hand placement and CGA to complete task with patient endorsing increased pain in R thigh with task. nurse made aware of patients pain levels.            Pertinent Vitals/ Pain       Pain Assessment Pain Assessment: 0-10 Pain Score: 5  Pain Location: R thigh with movement Pain Descriptors / Indicators: Aching Pain Intervention(s): Limited activity within patient's tolerance, Monitored during  session         Frequency  Min 2X/week        Progress Toward Goals  OT Goals(current goals can now be found in the care plan section)  Progress towards OT goals: Progressing toward goals     Plan         AM-PAC OT 6 Clicks Daily Activity     Outcome Measure   Help from another person eating meals?: None Help from another person taking care of personal grooming?: A Little Help from another person toileting, which includes using toliet, bedpan, or urinal?: A Lot Help from another person bathing (including washing, rinsing, drying)?: A  Lot Help from another person to put on and taking off regular upper body clothing?: A Little Help from another person to put on and taking off regular lower body clothing?: A Lot 6 Click Score: 16    End of Session Equipment Utilized During Treatment: Gait belt;Rolling walker (2 wheels)  OT Visit Diagnosis: Unsteadiness on feet (R26.81);Other abnormalities of gait and mobility (R26.89);History of falling (Z91.81);Muscle weakness (generalized) (M62.81);Dizziness and giddiness (R42)   Activity Tolerance Patient limited by fatigue   Patient Left in chair;with call bell/phone within reach   Nurse Communication Other (comment) (nurse made aware of patients reports during session as noted above. also patients reports of dizziness during session)        Time: 1610-9604 OT Time Calculation (min): 24 min  Charges: OT General Charges $OT Visit: 1 Visit OT Treatments $Self Care/Home Management : 23-37 mins  Janet Heckler, MS Acute Rehabilitation Department Office# [phone]   Janet Cooper 11/18/2023, 12:15 PM

## 2023-11-18 NOTE — Plan of Care (Signed)
  Problem: Activity: Goal: Risk for activity intolerance will decrease Outcome: Progressing   Problem: Nutrition: Goal: Adequate nutrition will be maintained Outcome: Progressing   Problem: Coping: Goal: Level of anxiety will decrease Outcome: Progressing   Problem: Pain Managment: Goal: General experience of comfort will improve and/or be controlled Outcome: Progressing   Problem: Skin Integrity: Goal: Risk for impaired skin integrity will decrease Outcome: Progressing

## 2023-11-18 NOTE — Progress Notes (Signed)
 PROGRESS NOTE    Janet Cooper  ZOX:[id number] DOB: [birth year] DOA: 11/11/2023 PCP: Lory Rough., PA-C   Brief Narrative:  The patient is an 89 year old female history of hypertension, hyperlipidemia presenting to the ED while was walking down 2 steps into her garage and landed on her right side with significant right hip pain. Imaging done consistent with right hip fracture.  She underwent surgical intervention but was also noted to be hypotensive.  Antihypertensives were held and she is typed and screened transfuse 1 unit PRBCs. Transferred to the ICU. CCM was consulted given her hypotension she had to be placed on low-dose Neo-Synephrine which has been weaned off.  Hemoglobin is stable now. PT/OT was recommending CIR but unlike to be approved so now recommendation is for SNF. GI Pathogen Panel Negative so will add Antidiarrheals and diarrhea is slowly improving.. BP improving and will CTM. Improving slowly and transferred to Telemetry 11/16/23.  She is complaining of some shortness of breath so IV fluid hydration was stopped and obtain chest x-ray as below.  She improved in her breathing status after she given some Lasix.  Blood pressure remained stable and blood count is improving.  We are transitioning her IV antibiotics to orals.  She appears medically stable for discharge to go to SNF for further rehabilitative efforts.  Assessment and Plan:  Right Intertrochanteric Femoral Fracture Secondary to mechanical fall:  Patient noted to be hypotensive and have low blood pressure which she states is at baseline and on antihypertensive medications. Patient states over the past few days has been having feelings of motion sickness and just not feeling too well with some nausea. Patient seen in consultation by orthopedics and patient underwent Surgical Repair 11/12/23. PT/OT to evaluate and treat and recently recommended CIR but they do not think she is a candidate so no pursuing SNF and awaiting  insurance authorization and bed placement.  She appears medically stable for discharge at this time now   Hypotension with shock: IMPROVED. Patient noted with systolic blood pressures in the 70s to 80s on presentation to the ED however patient alert oriented and answering questions appropriately.  Shock is likely multifactorial given her blood loss, pain meds, Bacteremia and antihypertensives; patient states on antihypertensive medications which she was told to continue per PCP despite low/soft blood pressure. Given IVF bolus and Resuscitation. - IV albumin  25 g x 2. Had to be placed on Short term Pressors. IVF now stopped.  Discontinue antihypertensive medications and likely will not resume on discharge.  Started her on midodrine  and will continue; Was increased to 10 mg 3 times daily but cut back to 5 mg TID given improvement in BP and will now further cut back to 2.5 mg po TID.  -Checking ECHOCardiogram and showed an EF of 65-70% w/ no RWMA; Left ventricular diastolic parameters are indeterminate. And RVSF was normal -Checked a UA and not very impressive but Urine Cx showing 10,000 CFU of ESBL E Coli. Blood Cx as below. Patient noted with an anemia will transfuse 1 unit PRBCs. Will check Cortisol Level in the AM and was 5.8. PCCM was consulted but signed off the case.  -Continue monitor blood pressures per protocol and last blood pressure was improved as below -Palliative Care consulted for GOC Discussion  GNR Bacteremia: 1/4 Bottles +. Was on IV Ceftriaxone  and Flagyl  and will change to po Augmentin. Nothing was positive on BCID but likely Anaerobe and was showing Bacteroides Uniformis that was Beta Lactamase positive. CTM and repeat  Blood Cx in the AM  Nausea and Diarrhea: Improving.  C Diff and GI Pathogen Panel Negative. C/w Supportive Care and Antidiarrheals w/ Loperamide. IVF as below to stop. Consulted Palliative Care Medicine given her frustration w/ Diarrhea but it has been chronic over 10-15  years. She has had Fecal Incontinence for a little while now and appears stable   Dyspnea and SOB: Improved. Checked CXR which showed Small bilateral pleural effusions, left greater than right and  Dense left lower lobe consolidation, which may reflect atelectasis or pneumonia. She is now - 932.9 mL since Admission. Will add Xopenex/Atrovent PRN. BNP was 472.9. Given a dose of IV Lasix 20 mg on 6/15 and IVF stopped.    Essential Hypertension: Hold antihypertensive medications secondary to problem #2. Patient noted to have low blood pressure on presentation, states BP has been low/soft with systolics in the 100s however was told to continue her antihypertensive medications per her PCP. Will discontinue antihypertensive medications and NOT resume on discharge. BP is improved and IVF now stopping. Last BP reading was 101/73   Normocytic Anemia: Patient noted to have a drop in hemoglobin of 8.2 from 12.0 on admission. Patient with no overt bleeding but FOBT is Positive, however her skin around her rectum is so broken down though; Per nurse her stool doesn't look like it has any blood in it but the skin around is bloody when she is cleaned. Underwent surgical intervention.  Anemia panel done and showed an iron level of 99, UIBC of 78, TIBC 169, saturation ratios of 58%, ferritin of 135, folate 15.2 and vitamin B12 105. Typed and screened and Transfused 1 unit of PRBCs due to presentation with hypotension. CTM for S/Sx of Bleeding. Hgb/Hct Trend: Recent Labs  Lab 11/12/23 2021 11/13/23 8657 11/14/23 0324 11/15/23 0327 11/16/23 1020 11/17/23 0337 11/18/23 0316  HGB 7.1* 10.0* 8.0* 8.3* 10.1* 9.0* 9.7*  HCT 22.8* 30.3* 23.9* 25.0* 30.4* 28.4* 29.2*  MCV 104.6* 94.7 92.6 94.0 95.0 96.3 94.2  -Repeat CBC in the AM   Thrombocytopenia: Improved. Platelet Count is now went from 162 -> 108 -> 101 -> 84 -> 100 -> 159 -> 161. CTM for S/Sx of Bleeding; no overt bleeding noted. Repeat CBC in the AM   Metabolic  Acidosis: Improved. Now has a CO2 of 25, AG of 5, Chloride Level of 106. IVF now stopped. CTM and Trend and repeat CMP in the AM   Hyperlipidemia: C/w Simvastatin  10 mg po Daily  Hypokalemia: K+ is now 4.2.CTM and Replete as Necessary. Repeat CMP in the AM  Hypophosphatemia: Phos Level is now 3.3. CTM and Replete as Necessary. Repeat CMP in the AM  Hypomagnesemia: Mag Level is now 2.0. CTM and Replete as Necessary. Repeat Mag Level in the AM  Hyperbilirubinemia: Mild. T Bili went from 0.8 -> 1.0 -> 1.3 -> 1.4. CTM and Trend and repeat CMP in the AM  GERD/GI Prophylaxis: PCCM has started her on Famotidine  20 mg po Daily   Hypoalbuminemia: Patient's Albumin  Level is now 2.4. CTM and Trend and repeat CMP in the AM   DVT prophylaxis: SCDs Start: 11/11/23 2346    Code Status: Limited: Do not attempt resuscitation (DNR) -DNR-LIMITED -Do Not Intubate/DNI  Family Communication: D/w Friend @ bedside  Disposition Plan:  Level of care: Telemetry Status is: Inpatient Remains inpatient appropriate because: Needs SNF and insurance authorization as well as bed availability.  Patient is medically stable for discharge at this time   Consultants:  Orthopedic Surgery  PCCM/Pulmonary Palliative Care Medicine   Procedures:  As delineated as above  Antimicrobials:  Anti-infectives (From admission, onward)    Start     Dose/Rate Route Frequency Ordered Stop   11/19/23 1000  amoxicillin-clavulanate (AUGMENTIN) 875-125 MG per tablet 1 tablet  Status:  Discontinued        1 tablet Oral Every 12 hours 11/18/23 1404 11/18/23 1452   11/18/23 2200  amoxicillin-clavulanate (AUGMENTIN) 875-125 MG per tablet 1 tablet        1 tablet Oral Every 12 hours 11/18/23 1452 11/21/23 0959   11/14/23 1000  metroNIDAZOLE  (FLAGYL ) tablet 500 mg  Status:  Discontinued        500 mg Oral Every 12 hours 11/14/23 0833 11/18/23 1404   11/14/23 0900  cefTRIAXone  (ROCEPHIN ) 2 g in sodium chloride  0.9 % 100 mL IVPB  Status:   Discontinued        2 g 200 mL/hr over 30 Minutes Intravenous Daily 11/14/23 0815 11/18/23 1404   11/12/23 2200  ceFAZolin  (ANCEF ) IVPB 2g/100 mL premix        2 g 200 mL/hr over 30 Minutes Intravenous Every 6 hours 11/12/23 1735 11/13/23 0622   11/12/23 1800  trimethoprim  (TRIMPEX ) tablet 100 mg        100 mg Oral Every evening 11/12/23 0923     11/12/23 1400  ceFAZolin  (ANCEF ) IVPB 2g/100 mL premix        2 g 200 mL/hr over 30 Minutes Intravenous On call to O.R. 11/12/23 1357 11/12/23 1614       Subjective: Seen and examined at bedside she thinks she is doing better.  States that her pain is improving and she is able to move little bit better today compared to yesterday.  Foley catheter being removed.  No nausea vomiting.  Still having some diarrhea but states is chronic.  No other concerns or complaints at this time.  Objective: Vitals:   11/17/23 1347 11/17/23 2130 11/18/23 0534 11/18/23 1155  BP: 116/69 115/70 130/86 101/73  Pulse: 70 77 77 86  Resp: 16 18 16    Temp: 98.4 F (36.9 C) 98.6 F (37 C) 98 F (36.7 C)   TempSrc:  Oral Oral   SpO2: 100% 95% 95%   Weight:      Height:        Intake/Output Summary (Last 24 hours) at 11/18/2023 1857 Last data filed at 11/18/2023 1745 Gross per 24 hour  Intake 600 ml  Output 2150 ml  Net -1550 ml   Filed Weights   11/12/23 1411 11/15/23 0340  Weight: 48.1 kg 56 kg   Examination: Physical Exam:  Constitutional: Thin elderly female in no acute distress Respiratory: Diminished to auscultation bilaterally, no wheezing, rales, rhonchi or crackles. Normal respiratory effort and patient is not tachypenic. No accessory muscle use.  Unlabored breathing Cardiovascular: RRR, no murmurs / rubs / gallops. S1 and S2 auscultated. No extremity edema. Abdomen: Soft, non-tender, non-distended. Bowel sounds positive.  GU: Deferred.  Foley catheter is in place Musculoskeletal: No clubbing / cyanosis of digits/nails. No joint deformity upper  and lower extremities. Skin: No rashes, lesions, ulcers on limited skin evaluation. No induration; Warm and dry.  Neurologic: CN 2-12 grossly intact with no focal deficits. Romberg sign and cerebellar reflexes not assessed.  Psychiatric: Normal judgment and insight. Alert and oriented x 3.   Data Reviewed: I have personally reviewed following labs and imaging studies  CBC: Recent Labs  Lab 11/14/23 0324 11/15/23 0327 11/16/23 1020 11/17/23  2956 11/18/23 0316  WBC 5.3 5.4 6.1 5.6 6.2  NEUTROABS 3.5 3.4 4.0 3.4 3.8  HGB 8.0* 8.3* 10.1* 9.0* 9.7*  HCT 23.9* 25.0* 30.4* 28.4* 29.2*  MCV 92.6 94.0 95.0 96.3 94.2  PLT 84* 100* 159 161 215   Basic Metabolic Panel: Recent Labs  Lab 11/14/23 0324 11/15/23 0327 11/16/23 1020 11/17/23 0337 11/18/23 0316  NA 137 135 137 137 136  K 3.9 3.1* 4.1 4.0 4.2  CL 112* 103 105 108 106  CO2 19* 26 25 23 25   GLUCOSE 120* 109* 160* 90 107*  BUN 18 12 8 10 17   CREATININE 0.76 0.61 0.74 0.61 0.65  CALCIUM  7.1* 6.6* 7.4* 7.2* 8.1*  MG 2.2 1.9 1.9 1.9 2.0  PHOS 1.6* 2.6 1.5* 2.5 3.3   GFR: Estimated Creatinine Clearance: 35.9 mL/min (by C-G formula based on SCr of 0.65 mg/dL). Liver Function Tests: Recent Labs  Lab 11/14/23 0324 11/15/23 0327 11/16/23 1020 11/17/23 0337 11/18/23 0316  AST 23 26 37 34 45*  ALT 7 8 11 13 18   ALKPHOS 16* 18* 23* 22* 25*  BILITOT 0.7 0.8 1.0 1.3* 1.4*  PROT 3.8* 3.9* 4.3* 4.1* 4.6*  ALBUMIN  2.4* 2.3* 2.4* 2.2* 2.4*   No results for input(s): LIPASE, AMYLASE in the last 168 hours. No results for input(s): AMMONIA in the last 168 hours. Coagulation Profile: Recent Labs  Lab 11/11/23 2038  INR 1.0   Cardiac Enzymes: Recent Labs  Lab 11/11/23 2038  CKTOTAL 111   BNP (last 3 results) No results for input(s): PROBNP in the last 8760 hours. HbA1C: No results for input(s): HGBA1C in the last 72 hours. CBG: No results for input(s): GLUCAP in the last 168 hours. Lipid Profile: No  results for input(s): CHOL, HDL, LDLCALC, TRIG, CHOLHDL, LDLDIRECT in the last 72 hours. Thyroid Function Tests: No results for input(s): TSH, T4TOTAL, FREET4, T3FREE, THYROIDAB in the last 72 hours. Anemia Panel: No results for input(s): VITAMINB12, FOLATE, FERRITIN, TIBC, IRON, RETICCTPCT in the last 72 hours. Sepsis Labs: Recent Labs  Lab 11/13/23 2130  LATICACIDVEN <0.3*    Recent Results (from the past 240 hours)  Urine Culture (for pregnant, neutropenic or urologic patients or patients with an indwelling urinary catheter)     Status: Abnormal   Collection Time: 11/12/23 12:12 PM   Specimen: Urine, Catheterized  Result Value Ref Range Status   Specimen Description   Final    URINE, CATHETERIZED Performed at [facility], [address], [place], [place] [address]    Special Requests   Final    NONE Performed at [facility], [address], [place], [place] [address]    Culture (A)  Final    10,000 COLONIES/mL ESCHERICHIA COLI Confirmed Extended Spectrum Beta-Lactamase Producer (ESBL).  In bloodstream infections from ESBL organisms, carbapenems are preferred over piperacillin/tazobactam. They are shown to have a lower risk of mortality.    Report Status 11/14/2023 FINAL  Final   Organism ID, Bacteria ESCHERICHIA COLI (A)  Final      Susceptibility   Escherichia coli - MIC*    AMPICILLIN >=32 RESISTANT Resistant     CEFAZOLIN  >=64 RESISTANT Resistant     CEFEPIME 16 RESISTANT Resistant     CEFTRIAXONE  >=64 RESISTANT Resistant     CIPROFLOXACIN  >=4 RESISTANT Resistant     GENTAMICIN <=1 SENSITIVE Sensitive     IMIPENEM <=0.25 SENSITIVE Sensitive     NITROFURANTOIN <=16 SENSITIVE Sensitive     TRIMETH/SULFA 80  RESISTANT Resistant     AMPICILLIN/SULBACTAM >=32 RESISTANT Resistant     PIP/TAZO <=4 SENSITIVE Sensitive ug/mL    * 10,000 COLONIES/mL ESCHERICHIA COLI  Culture, blood (Routine X 2) w Reflex  to ID Panel     Status: None   Collection Time: 11/12/23 12:53 PM   Specimen: BLOOD LEFT HAND  Result Value Ref Range Status   Specimen Description   Final    BLOOD LEFT HAND Performed at [facility], [address], [place], [place] [address]    Special Requests   Final    BOTTLES DRAWN AEROBIC AND ANAEROBIC Blood Culture adequate volume Performed at [facility], [address], [place], [place] [address]    Culture   Final    NO GROWTH 5 DAYS Performed at [facility], [address] [place], [place] [address]    Report Status 11/17/2023 FINAL  Final  Culture, blood (Routine X 2) w Reflex to ID Panel     Status: Abnormal   Collection Time: 11/12/23 12:59 PM   Specimen: BLOOD  Result Value Ref Range Status   Specimen Description   Final    BLOOD Performed at [facility], [address], [place], [place] [address]    Special Requests   Final    BOTTLES DRAWN AEROBIC AND ANAEROBIC Blood Culture adequate volume Performed at [facility], [address], [place], [place] [address]    Culture  Setup Time   Final    GRAM NEGATIVE RODS ANAEROBIC BOTTLE ONLY PHARMD E JACKSON 11/14/2023 @ 0221 BY AB    Culture (A)  Final    BACTEROIDES UNIFORMIS BETA LACTAMASE POSITIVE Performed at [facility], [address], [place], [place] [address]    Report Status 11/15/2023 FINAL  Final  Blood Culture ID Panel (Reflexed)     Status: None   Collection Time: 11/12/23 12:59 PM  Result Value Ref Range Status   Enterococcus faecalis NOT DETECTED NOT DETECTED Final   Enterococcus Faecium NOT DETECTED NOT DETECTED Final   Listeria monocytogenes NOT DETECTED NOT DETECTED Final   Staphylococcus species NOT DETECTED NOT DETECTED Final   Staphylococcus aureus (BCID) NOT DETECTED NOT DETECTED Final   Staphylococcus epidermidis NOT DETECTED NOT DETECTED Final   Staphylococcus lugdunensis NOT DETECTED NOT DETECTED Final    Streptococcus species NOT DETECTED NOT DETECTED Final   Streptococcus agalactiae NOT DETECTED NOT DETECTED Final   Streptococcus pneumoniae NOT DETECTED NOT DETECTED Final   Streptococcus pyogenes NOT DETECTED NOT DETECTED Final   A.calcoaceticus-baumannii NOT DETECTED NOT DETECTED Final   Bacteroides fragilis NOT DETECTED NOT DETECTED Final   Enterobacterales NOT DETECTED NOT DETECTED Final   Enterobacter cloacae complex NOT DETECTED NOT DETECTED Final   Escherichia coli NOT DETECTED NOT DETECTED Final   Klebsiella aerogenes NOT DETECTED NOT DETECTED Final   Klebsiella oxytoca NOT DETECTED NOT DETECTED Final   Klebsiella pneumoniae NOT DETECTED NOT DETECTED Final   Proteus species NOT DETECTED NOT DETECTED Final   Salmonella species NOT DETECTED NOT DETECTED Final   Serratia marcescens NOT DETECTED NOT DETECTED Final   Haemophilus influenzae NOT DETECTED NOT DETECTED Final   Neisseria meningitidis NOT DETECTED NOT DETECTED Final   Pseudomonas aeruginosa NOT DETECTED NOT DETECTED Final   Stenotrophomonas maltophilia NOT DETECTED NOT DETECTED Final   Candida albicans NOT DETECTED NOT DETECTED Final   Candida auris NOT DETECTED NOT DETECTED Final   Candida glabrata NOT DETECTED NOT DETECTED  Final   Candida krusei NOT DETECTED NOT DETECTED Final   Candida parapsilosis NOT DETECTED NOT DETECTED Final   Candida tropicalis NOT DETECTED NOT DETECTED Final   Cryptococcus neoformans/gattii NOT DETECTED NOT DETECTED Final    Comment: Performed at [facility], [address] [place], [place] [address]  MRSA Next Gen by PCR, Nasal     Status: None   Collection Time: 11/12/23 10:48 PM   Specimen: Nasal Mucosa; Nasal Swab  Result Value Ref Range Status   MRSA by PCR Next Gen NOT DETECTED NOT DETECTED Final    Comment: (NOTE) The GeneXpert MRSA Assay (FDA approved for NASAL specimens only), is one component of a comprehensive MRSA colonization surveillance program. It is not intended to  diagnose MRSA infection nor to guide or monitor treatment for MRSA infections. Test performance is not FDA approved in patients less than 2 years old. Performed at [facility], [address] [place], [place] [address]   C Difficile Quick Screen w PCR reflex     Status: None   Collection Time: 11/13/23  7:50 PM   Specimen: STOOL  Result Value Ref Range Status   C Diff antigen NEGATIVE NEGATIVE Final   C Diff toxin NEGATIVE NEGATIVE Final   C Diff interpretation No C. difficile detected.  Final    Comment: Performed at [facility], [address], [place], [place] [address]  Gastrointestinal Panel by PCR , Stool     Status: None   Collection Time: 11/14/23 10:29 AM   Specimen: Stool  Result Value Ref Range Status   Campylobacter species NOT DETECTED NOT DETECTED Final   Plesimonas shigelloides NOT DETECTED NOT DETECTED Final   Salmonella species NOT DETECTED NOT DETECTED Final   Yersinia enterocolitica NOT DETECTED NOT DETECTED Final   Vibrio species NOT DETECTED NOT DETECTED Final   Vibrio cholerae NOT DETECTED NOT DETECTED Final   Enteroaggregative E coli (EAEC) NOT DETECTED NOT DETECTED Final   Enteropathogenic E coli (EPEC) NOT DETECTED NOT DETECTED Final   Enterotoxigenic E coli (ETEC) NOT DETECTED NOT DETECTED Final   Shiga like toxin producing E coli (STEC) NOT DETECTED NOT DETECTED Final   Shigella/Enteroinvasive E coli (EIEC) NOT DETECTED NOT DETECTED Final   Cryptosporidium NOT DETECTED NOT DETECTED Final   Cyclospora cayetanensis NOT DETECTED NOT DETECTED Final   Entamoeba histolytica NOT DETECTED NOT DETECTED Final   Giardia lamblia NOT DETECTED NOT DETECTED Final   Adenovirus F40/41 NOT DETECTED NOT DETECTED Final   Astrovirus NOT DETECTED NOT DETECTED Final   Norovirus GI/GII NOT DETECTED NOT DETECTED Final   Rotavirus A NOT DETECTED NOT DETECTED Final   Sapovirus (I, II, IV, and V) NOT DETECTED NOT DETECTED Final     Comment: Performed at [facility], [address] [place], [place] [address]    Radiology Studies: No results found.  Scheduled Meds:  amoxicillin-clavulanate  1 tablet Oral Q12H   aspirin   81 mg Oral BID   Chlorhexidine  Gluconate Cloth  6 each Topical Daily   dorzolamide -timolol   1 drop Both Eyes BID   famotidine   20 mg Oral Daily   feeding supplement  237 mL Oral BID BM   latanoprost   1 drop Both Eyes QHS   midodrine   2.5 mg Oral TID WC   multivitamin with minerals  1 tablet Oral Daily   pantoprazole   40 mg Oral Daily   simvastatin   10 mg Oral q1800   trimethoprim   100 mg  Oral QPM   Continuous Infusions:  sodium chloride       LOS: 7 days   Aura Leeds, DO [facility] Available via Epic secure chat 7am-7pm After these hours, please refer to coverage provider listed on [email or web address] 11/18/2023, 6:57 PM

## 2023-11-18 NOTE — Progress Notes (Signed)
 Contacted on-call provider Denece Finger, NP) via secure chat in regards to continuation of foley catheter, nothing in notes as to why she still has her foley in place on POD#6 ,she had gone to ICU post-op and it was continued while she was there, she has since transferred out of ICU and was able to get OOB with PT today, patient also has chronic frequent loose stools incontinently, provider reviewed chart and in agreement with this writer that foley should be removed, will pull foley on morning rounds unless patient wakes up prior.

## 2023-11-19 DIAGNOSIS — Z66 Do not resuscitate: Secondary | ICD-10-CM | POA: Diagnosis not present

## 2023-11-19 DIAGNOSIS — S72001P Fracture of unspecified part of neck of right femur, subsequent encounter for closed fracture with malunion: Secondary | ICD-10-CM | POA: Diagnosis not present

## 2023-11-19 DIAGNOSIS — D649 Anemia, unspecified: Secondary | ICD-10-CM | POA: Diagnosis not present

## 2023-11-19 DIAGNOSIS — E785 Hyperlipidemia, unspecified: Secondary | ICD-10-CM | POA: Diagnosis not present

## 2023-11-19 LAB — COMPREHENSIVE METABOLIC PANEL WITH GFR
ALT: 21 U/L (ref 0–44)
AST: 41 U/L (ref 15–41)
Albumin: 2.5 g/dL — ABNORMAL LOW (ref 3.5–5.0)
Alkaline Phosphatase: 25 U/L — ABNORMAL LOW (ref 38–126)
Anion gap: 7 (ref 5–15)
BUN: 17 mg/dL (ref 8–23)
CO2: 24 mmol/L (ref 22–32)
Calcium: 7.9 mg/dL — ABNORMAL LOW (ref 8.9–10.3)
Chloride: 108 mmol/L (ref 98–111)
Creatinine, Ser: 0.8 mg/dL (ref 0.44–1.00)
GFR, Estimated: >60 mL/min (ref >60)
Glucose, Bld: 117 mg/dL — ABNORMAL HIGH (ref 70–99)
Potassium: 4.2 mmol/L (ref 3.5–5.1)
Sodium: 139 mmol/L (ref 135–145)
Total Bilirubin: 1.2 mg/dL (ref 0.0–1.2)
Total Protein: 4.5 g/dL — ABNORMAL LOW (ref 6.5–8.1)

## 2023-11-19 LAB — CBC WITH DIFFERENTIAL/PLATELET
Abs Immature Granulocytes: 0.07 10*3/uL (ref 0.00–0.07)
Basophils Absolute: 0 10*3/uL (ref 0.0–0.1)
Basophils Relative: 0 %
Eosinophils Absolute: 0.4 10*3/uL (ref 0.0–0.5)
Eosinophils Relative: 6 %
HCT: 30.2 % — ABNORMAL LOW (ref 36.0–46.0)
Hemoglobin: 9.7 g/dL — ABNORMAL LOW (ref 12.0–15.0)
Immature Granulocytes: 1 %
Lymphocytes Relative: 16 %
Lymphs Abs: 0.9 10*3/uL (ref 0.7–4.0)
MCH: 31.5 pg (ref 26.0–34.0)
MCHC: 32.1 g/dL (ref 30.0–36.0)
MCV: 98.1 fL (ref 80.0–100.0)
Monocytes Absolute: 1 10*3/uL (ref 0.1–1.0)
Monocytes Relative: 16 %
Neutro Abs: 3.5 10*3/uL (ref 1.7–7.7)
Neutrophils Relative %: 61 %
Platelets: 233 10*3/uL (ref 150–400)
RBC: 3.08 MIL/uL — ABNORMAL LOW (ref 3.87–5.11)
RDW: 17.5 % — ABNORMAL HIGH (ref 11.5–15.5)
WBC: 5.9 10*3/uL (ref 4.0–10.5)
nRBC: 0 % (ref 0.0–0.2)

## 2023-11-19 LAB — MAGNESIUM: Magnesium: 2.1 mg/dL (ref 1.7–2.4)

## 2023-11-19 LAB — PHOSPHORUS: Phosphorus: 3.3 mg/dL (ref 2.5–4.6)

## 2023-11-19 MED ORDER — SACCHAROMYCES BOULARDII 250 MG PO CAPS: 250.0000 mg | ORAL_CAPSULE | Freq: Two times a day (BID) | ORAL | Status: AC

## 2023-11-19 MED ORDER — MIDODRINE HCL 2.5 MG PO TABS: 2.5000 mg | ORAL_TABLET | Freq: Three times a day (TID) | ORAL | Status: AC

## 2023-11-19 MED ORDER — AMOXICILLIN-POT CLAVULANATE 875-125 MG PO TABS: 1.0000 | ORAL_TABLET | Freq: Two times a day (BID) | ORAL | Status: AC

## 2023-11-19 MED ORDER — LEVALBUTEROL HCL 0.63 MG/3ML IN NEBU: 0.6300 mg | INHALATION_SOLUTION | Freq: Four times a day (QID) | RESPIRATORY_TRACT | Status: AC | PRN

## 2023-11-19 MED ORDER — FAMOTIDINE 20 MG PO TABS: 20.0000 mg | ORAL_TABLET | Freq: Every day | ORAL | Status: AC

## 2023-11-19 MED ORDER — OXYCODONE HCL 5 MG PO TABS: 2.5000 mg | ORAL_TABLET | ORAL | 0 refills | Status: DC | PRN

## 2023-11-19 MED ORDER — SACCHAROMYCES BOULARDII 250 MG PO CAPS
250.0000 mg | ORAL_CAPSULE | Freq: Two times a day (BID) | ORAL | Status: DC
  Administered 2023-11-19 – 2023-11-20 (×2): 250 mg via ORAL
  Filled 2023-11-19 (×2): qty 1

## 2023-11-19 MED ORDER — LOPERAMIDE HCL 2 MG PO CAPS: 2.0000 mg | ORAL_CAPSULE | ORAL | Status: AC | PRN

## 2023-11-19 MED ORDER — IPRATROPIUM BROMIDE 0.02 % IN SOLN: 0.5000 mg | Freq: Four times a day (QID) | RESPIRATORY_TRACT | Status: AC | PRN

## 2023-11-19 MED ORDER — ADULT MULTIVITAMIN W/MINERALS CH: 1.0000 | ORAL_TABLET | Freq: Every day | ORAL | Status: AC

## 2023-11-19 MED ORDER — ENSURE PLUS HIGH PROTEIN PO LIQD: 237.0000 mL | Freq: Two times a day (BID) | ORAL | Status: AC

## 2023-11-19 MED ORDER — ONDANSETRON HCL 4 MG PO TABS: 4.0000 mg | ORAL_TABLET | Freq: Four times a day (QID) | ORAL | Status: AC | PRN

## 2023-11-19 MED ORDER — HYDROXYZINE HCL 25 MG PO TABS: 25.0000 mg | ORAL_TABLET | Freq: Three times a day (TID) | ORAL | Status: AC | PRN

## 2023-11-19 NOTE — TOC Progression Note (Signed)
 Transition of Care [facility]) - Progression Note    Patient Details  Name: Janet Cooper MRN: [id number] Date of Birth: [birth year]  [facility] CM/SW Contact  Delilah Fend, LCSW Phone Number: 11/19/2023, 2:23 PM  Clinical Narrative:    Pt presented with the following SNF bed offers:  [facility] Preferred SNF  Accepted -- [address] [place] [address] [phone] --  [facility]  Accepted -- [address] [place] [address] [phone] --  [facility]  Accepted -- [address] [place] [address] [phone] --  [facility]  Accepted -- [address] [place], [place] [address] [phone] --  [facility]  Accepted -- [address], [place] [address] [phone] --  [facility] Preferred SN[facility]  Accepted -- [address], [place] [address] [phone] --  [facility]  Accepted -- [address] [place] [address] [phone] --  [facility]  Accepted -- [address], [place] [address] [phone] --  [facility] Preferred SNF  Accepted -- [address] [place] [address] [phone]    Pt and friend are reviewing and will follow up on choice.   Expected Discharge Plan: Home w Home Health Services Barriers to Discharge: Continued Medical Work up  Expected Discharge Plan and Services                                               Social Determinants of Health (SDOH) Interventions SDOH Screenings   Food Insecurity: Patient Unable To Answer (11/13/2023)  Housing: Patient Unable To Answer (11/13/2023)  Utilities: Patient Unable To Answer (11/13/2023)  Social Connections: Patient Unable To  Answer (11/13/2023)  Tobacco Use: Low Risk  (11/12/2023)    Readmission Risk Interventions    11/14/2023   11:13 AM  Readmission Risk Prevention Plan  Post Dischage Appt Complete  Medication Screening Complete  Transportation Screening Complete

## 2023-11-19 NOTE — Plan of Care (Signed)
  Problem: Coping: Goal: Level of anxiety will decrease Outcome: Progressing   Problem: Elimination: Goal: Will not experience complications related to bowel motility Outcome: Progressing Goal: Will not experience complications related to urinary retention Outcome: Progressing   Problem: Pain Managment: Goal: General experience of comfort will improve and/or be controlled Outcome: Progressing

## 2023-11-19 NOTE — Discharge Summary (Signed)
 Physician Discharge Summary   Patient: Janet Cooper MRN: [id number] DOB: Jul 27, 1929  Admit date:     11/11/2023  Discharge date: TBD  Discharge Physician: Aura Leeds, DO   PCP: Lory Rough., PA-C   Recommendations at discharge:   Follow-up with PCP within 1 to 2 weeks repeat CBC, CMP, mag, Phos within 1 week Follow-up with orthopedic surgery in outpatient setting within 1 to 2 weeks Follow-up with Palliative Care medicine at SNF Discontinue Antihypertensives at Discharge  Discharge Diagnoses: Principal Problem:   Closed right hip fracture (HCC) Active Problems:   Hyperlipidemia   Hypertension   S/P total right hip arthroplasty   Anemia   Hypotension   Intertrochanteric fracture of right hip, closed, initial encounter (HCC)   Shock (HCC)   DNR (do not resuscitate)  Resolved Problems:   * No resolved hospital problems. [facility] Course: The patient is an 89 year old female history of hypertension, hyperlipidemia presenting to the ED while was walking down 2 steps into her garage and landed on her right side with significant right hip pain. Imaging done consistent with right hip fracture.  She underwent surgical intervention but was also noted to be hypotensive.  Antihypertensives were held and she is typed and screened transfuse 1 unit PRBCs. Transferred to the ICU. CCM was consulted given her hypotension she had to be placed on low-dose Neo-Synephrine which has been weaned off.  Hemoglobin has stablized. PT/OT was recommending CIR but unlike to be approved so now recommendation is for SNF. GI Pathogen Panel Negative so will add Antidiarrheals and diarrhea is slowly improving.. BP improving and will CTM. Improving slowly and transferred to Telemetry 11/16/23.  She is complaining of some shortness of breath so IV fluid hydration was stopped and obtain chest x-ray as below.  She improved in her breathing status after she given some Lasix.  Blood pressure remained stable  and blood count is improving.  We are transitioning her IV antibiotics to orals.  She appears medically stable for discharge as of 11/18/2023 to go to SNF for further rehabilitative efforts and currently awaiting insurance authorization and bed acceptance.  Assessment and Plan:  Right Intertrochanteric Femoral Fracture Secondary to mechanical fall:  Patient noted to be hypotensive and have low blood pressure which she states is at baseline and on antihypertensive medications. Patient states over the past few days has been having feelings of motion sickness and just not feeling too well with some nausea. Patient seen in consultation by orthopedics and patient underwent Surgical Repair 11/12/23. PT/OT to evaluate and treat and recently recommended CIR but they do not think she is a candidate so no pursuing SNF and awaiting insurance authorization and bed placement.  She appears medically stable for discharge at this time now   Hypotension with shock: IMPROVED. Patient noted with systolic blood pressures in the 70s to 80s on presentation to the ED however patient alert oriented and answering questions appropriately.  Shock is likely multifactorial given her blood loss, pain meds, Bacteremia and antihypertensives; patient states on antihypertensive medications which she was told to continue per PCP despite low/soft blood pressure. Given IVF bolus and Resuscitation. - IV albumin  25 g x 2. Had to be placed on Short term Pressors. IVF now stopped.  Discontinue antihypertensive medications and likely will not resume on discharge.  Started her on midodrine  and will continue; Was increased to 10 mg 3 times daily but cut back to 5 mg TID given improvement in BP and will now further  cut back to 2.5 mg po TID.  -Checking ECHOCardiogram and showed an EF of 65-70% w/ no RWMA; Left ventricular diastolic parameters are indeterminate. And RVSF was normal -Checked a UA and not very impressive but Urine Cx showing 10,000 CFU of  ESBL E Coli. Blood Cx as below. Patient noted with an anemia will transfuse 1 unit PRBCs. Will check Cortisol Level in the AM and was 5.8. PCCM was consulted but signed off the case.  -Continue monitor blood pressures per protocol and last blood pressure was improved as below -Palliative Care consulted for GOC Discussion patient is to remain DNR and allow for natural death and goals to try to avoid rehospitalization but they are recommending outpatient follow-up with palliative care at the SNF given that she has low levels of community support and family support due to her family passing away  GNR Bacteremia: 1/4 Bottles +. Was on IV Ceftriaxone  and Flagyl  and will change to po Augmentin to complete 7 day course. Nothing was positive on BCID but likely Anaerobe and was showing Bacteroides Uniformis that was Beta Lactamase positive. CTM and follow up in the outpatient setting  Nausea and Diarrhea: Improving.  C Diff and GI Pathogen Panel Negative. C/w Supportive Care and Antidiarrheals w/ Loperamide. IVF as below to stop. Consulted Palliative Care Medicine given her frustration w/ Diarrhea but it has been chronic over 10-15 years. She has had Fecal Incontinence for a little while now and appears stable   Dyspnea and SOB: Improved. Checked CXR which showed Small bilateral pleural effusions, left greater than right and  Dense left lower lobe consolidation, which may reflect atelectasis or pneumonia. She is now -1,793 mL since Admission. C/w Xopenex/Atrovent PRN. BNP was 472.9. Given a dose of IV Lasix 20 mg on 6/15 and IVF stopped. Respiratory Status Stable  Acute Urinary Retention: Foley removed yesterday but continues to retain so will replace foley catheter and continue and do TOV in the outpt setting once more moblie    Essential Hypertension: Hold antihypertensive medications secondary to problem #2. Patient noted to have low blood pressure on presentation, states BP has been low/soft with systolics  in the 100s however was told to continue her antihypertensive medications per her PCP. Will discontinue antihypertensive medications and NOT resume on discharge. BP is improved and IVF now stopping. Last BP reading was 104/65   Normocytic Anemia: Patient noted to have a drop in hemoglobin of 8.2 from 12.0 on admission. Patient with no overt bleeding but FOBT is Positive, however her skin around her rectum is so broken down though; Per nurse her stool doesn't look like it has any blood in it but the skin around is bloody when she is cleaned. Underwent surgical intervention.  Anemia panel done and showed an iron level of 99, UIBC of 78, TIBC 169, saturation ratios of 58%, ferritin of 135, folate 15.2 and vitamin B12 105. Typed and screened and Transfused 1 unit of PRBCs due to presentation with hypotension. CTM for S/Sx of Bleeding. Hgb/Hct Trend has now stabilized and last Hgb/Hct was 9.7/30.2. Repeat CBC in the AM   Thrombocytopenia: Improved and resolved. Platelet Count is now 233. CTM for S/Sx of Bleeding; no overt bleeding noted. Repeat CBC in the AM   Metabolic Acidosis: Improved. Now has a CO2 of 24, AG of 7, Chloride Level of 108. IVF now stopped. CTM and Trend and repeat CMP in the AM   Hyperlipidemia: C/w Simvastatin  10 mg po Daily  Hypokalemia: K+ is now 4.2.CTM  and Replete as Necessary. Repeat CMP in the AM  Hypophosphatemia: Improved. Phos Level is now 3.3. CTM and Replete as Necessary. Repeat CMP in the AM  Hypomagnesemia: Improved. Mag Level is now 2.0. CTM and Replete as Necessary. Repeat Mag Level in the AM  Hyperbilirubinemia: Mild. T Bili went from 0.8 -> 1.0 -> 1.3 -> 1.4 -> 1.2. CTM and Trend and repeat CMP in the AM  GERD/GI Prophylaxis: PCCM has started her on Famotidine  20 mg po Daily   Hypoalbuminemia: Patient's Albumin  Level is now 2.5. CTM and Trend and repeat CMP in the AM  Nutrition Documentation    Flowsheet Row ED to Hosp-Admission (Current) from 11/11/2023 in  [place] [facility]  Nutrition Problem Increased nutrient needs  Etiology hip fracture  Nutrition Goal Patient will meet greater than or equal to 90% of their needs  Interventions Refer to RD note for recommendations, Ensure Enlive (each supplement provides 350kcal and 20 grams of protein), Magic cup   Consultants: PCCM, Orthopedic Surgery, Palliative Care Medicine  Procedures performed: As delineated as above   Disposition: Skilled nursing facility  Diet recommendation:  Cardiac diet  DISCHARGE MEDICATION: Allergies as of 11/19/2023       Reactions   Homatropine Itching   Hydrocodone Itching   Sulfa Antibiotics Itching   Sulfamethoxazole Itching        Medication List     STOP taking these medications    amLODipine-olmesartan 5-40 MG tablet Commonly known as: AZOR   methocarbamol  500 MG tablet Commonly known as: Robaxin    predniSONE  10 MG (21) Tbpk tablet Commonly known as: STERAPRED UNI-PAK 21 TAB       TAKE these medications    amoxicillin-clavulanate 875-125 MG tablet Commonly known as: AUGMENTIN Take 1 tablet by mouth every 12 (twelve) hours for 4 doses.   aspirin  EC 81 MG tablet Take 81 mg by mouth.   B-complex with vitamin C tablet Take 1 tablet by mouth daily.   Calcium  Citrate-Vitamin D  200-250 MG-UNIT Tabs Take 1 tablet by mouth daily.   dorzolamide  2 % ophthalmic solution Commonly known as: TRUSOPT  Place 1 drop into both eyes 2 (two) times daily.   famotidine  20 MG tablet Commonly known as: PEPCID  Take 1 tablet (20 mg total) by mouth daily. Start taking on: November 20, 2023   feeding supplement Liqd Take 237 mLs by mouth 2 (two) times daily between meals. Start taking on: November 20, 2023   glucosamine-chondroitin 500-400 MG tablet Take 1 tablet by mouth daily.   hydrOXYzine 25 MG tablet Commonly known as: ATARAX Take 1 tablet (25 mg total) by mouth 3 (three) times daily as needed for anxiety.   ipratropium 0.02 % nebulizer  solution Commonly known as: ATROVENT Take 2.5 mLs (0.5 mg total) by nebulization every 6 (six) hours as needed for shortness of breath.   latanoprost  0.005 % ophthalmic solution Commonly known as: XALATAN  INSTILL 1 DROP INTO BOTH EYES AT BEDTIME   levalbuterol 0.63 MG/3ML nebulizer solution Commonly known as: XOPENEX Take 3 mLs (0.63 mg total) by nebulization every 6 (six) hours as needed for wheezing.   loperamide 2 MG capsule Commonly known as: IMODIUM Take 1 capsule (2 mg total) by mouth as needed for diarrhea or loose stools.   MAGNESIUM  PO Take 1 tablet by mouth daily.   midodrine  2.5 MG tablet Commonly known as: PROAMATINE  Take 1 tablet (2.5 mg total) by mouth 3 (three) times daily with meals.   multivitamin with minerals Tabs tablet Take  1 tablet by mouth daily. Start taking on: November 20, 2023   omeprazole 40 MG capsule Commonly known as: PRILOSEC Take 40 mg by mouth daily.   ondansetron  4 MG tablet Commonly known as: ZOFRAN  Take 1 tablet (4 mg total) by mouth every 6 (six) hours as needed for nausea.   oxyCODONE  5 MG immediate release tablet Commonly known as: Oxy IR/ROXICODONE  Take 0.5-1 tablets (2.5-5 mg total) by mouth every 4 (four) hours as needed for up to 10 doses for moderate pain (pain score 4-6) (pain score 4-6).   Prolia 60 MG/ML Sosy injection Generic drug: denosumab Inject 60 mg into the skin every 6 (six) months.   saccharomyces boulardii 250 MG capsule Commonly known as: Florastor Take 1 capsule (250 mg total) by mouth 2 (two) times daily.   simvastatin  10 MG tablet Commonly known as: ZOCOR  TAKE 1 TABLET BY MOUTH EVERY DAY NIGHTLY   trimethoprim  100 MG tablet Commonly known as: TRIMPEX  Take 100 mg by mouth every evening.   Trospium Chloride 60 MG Cp24 Take 1 tablet by mouth daily.        Follow-up Information     Claiborne Crew, MD. Schedule an appointment as soon as possible for a visit in 2 week(s).   Specialty: Orthopedic  Surgery Contact information: [address] [place] [address] [place] [address] [phone]                Discharge Exam: Cleavon Curls Weights   11/12/23 1411 11/15/23 0340  Weight: 48.1 kg 56 kg   Vitals:   11/19/23 0611 11/19/23 1337  BP: 118/72 104/65  Pulse: 75 81  Resp: 18 16  Temp: 98.1 F (36.7 C) 98 F (36.7 C)  SpO2: 96% 100%   Examination: Physical Exam:  Constitutional: Thin elderly female in no acute distress Respiratory: Diminished to auscultation bilaterally, no wheezing, rales, rhonchi or crackles. Normal respiratory effort and patient is not tachypenic. No accessory muscle use.  Unlabored breathing Cardiovascular: RRR, no murmurs / rubs / gallops. S1 and S2 auscultated. No extremity edema.  Abdomen: Soft, non-tender, non-distended. Bowel sounds positive.  GU: Deferred. Musculoskeletal: No clubbing / cyanosis of digits/nails. No joint deformity upper and lower extremities.  Skin: No rashes, lesions, ulcers on limited skin evaluation. No induration; Warm and dry.  Neurologic: CN 2-12 grossly intact with no focal deficits. Romberg sign and cerebellar reflexes not assessed.  Psychiatric: Normal judgment and insight. Alert and oriented x 3. A little anxious  Condition at discharge: stable  The results of significant diagnostics from this hospitalization (including imaging, microbiology, ancillary and laboratory) are listed below for reference.   Imaging Studies: DG CHEST PORT 1 VIEW Result Date: 11/16/2023 CLINICAL DATA:  Short of breath, right hip surgery 4 days ago EXAM: PORTABLE CHEST 1 VIEW COMPARISON:  11/11/2023 FINDINGS: Single frontal view of the chest demonstrates mild enlargement the cardiac silhouette. Dense left lower lobe consolidation with small left pleural effusion. Trace right pleural effusion. No pneumothorax. No acute bony abnormalities. IMPRESSION: 1. Small bilateral pleural effusions, left greater than right. 2. Dense left lower lobe  consolidation, which may reflect atelectasis or pneumonia. Electronically Signed   By: Bobbye Burrow M.D.   On: 11/16/2023 19:44   ECHOCARDIOGRAM COMPLETE Result Date: 11/13/2023    ECHOCARDIOGRAM REPORT   Patient Name:   JESSIAH WOJNAR Date of Exam: 11/13/2023 Medical Rec #:  [id number]          Height:       60.0 in Accession #:  [id number]         Weight:       106.0 lb Date of Birth:  14-Feb-1930          BSA:          1.425 m Patient Age:    89 years           BP:           114/59 mmHg Patient Gender: F                  HR:           75 bpm. Exam Location:  Inpatient Procedure: 2D Echo, Cardiac Doppler and Color Doppler (Both Spectral and Color            Flow Doppler were utilized during procedure). Indications:    Shock R57.9  History:        Patient has no prior history of Echocardiogram examinations.                 Risk Factors:Hypertension.  Sonographer:    Astrid Blamer Referring Phys: [id number] OMAR Macario Savin  Sonographer Comments: Image acquisition challenging due to respiratory motion. IMPRESSIONS  1. Left ventricular ejection fraction, by estimation, is 65 to 70%. The left ventricle has normal function. The left ventricle has no regional wall motion abnormalities. Left ventricular diastolic parameters are indeterminate.  2. Right ventricular systolic function is normal. The right ventricular size is not well visualized.  3. The mitral valve is grossly normal. Trivial mitral valve regurgitation. No evidence of mitral stenosis.  4. The aortic valve is grossly normal. There is mild calcification of the aortic valve. Aortic valve regurgitation is not visualized. No aortic stenosis is present. Comparison(s): No prior Echocardiogram. Conclusion(s)/Recommendation(s): Normal biventricular function without evidence of hemodynamically significant valvular heart disease. FINDINGS  Left Ventricle: Left ventricular ejection fraction, by estimation, is 65 to 70%. The left ventricle has normal function. The  left ventricle has no regional wall motion abnormalities. The left ventricular internal cavity size was normal in size. There is  no left ventricular hypertrophy. Left ventricular diastolic parameters are indeterminate. Right Ventricle: The right ventricular size is not well visualized. Right vetricular wall thickness was not well visualized. Right ventricular systolic function is normal. Left Atrium: Left atrial size was normal in size. Right Atrium: Right atrial size was normal in size. Pericardium: There is no evidence of pericardial effusion. Mitral Valve: The mitral valve is grossly normal. Trivial mitral valve regurgitation. No evidence of mitral valve stenosis. Tricuspid Valve: The tricuspid valve is grossly normal. Tricuspid valve regurgitation is mild . No evidence of tricuspid stenosis. Aortic Valve: The aortic valve is grossly normal. There is mild calcification of the aortic valve. Aortic valve regurgitation is not visualized. No aortic stenosis is present. Aortic valve mean gradient measures 6.0 mmHg. Aortic valve peak gradient measures 10.4 mmHg. Aortic valve area, by VTI measures 2.00 cm. Pulmonic Valve: The pulmonic valve was not well visualized. Pulmonic valve regurgitation is not visualized. No evidence of pulmonic stenosis. Aorta: The aortic root is normal in size and structure. Venous: The inferior vena cava was not well visualized. IAS/Shunts: The atrial septum is grossly normal.  LEFT VENTRICLE PLAX 2D LVIDd:         4.20 cm   Diastology LVIDs:         2.20 cm   LV e' medial:    4.24 cm/s LV PW:         0.80  cm   LV E/e' medial:  21.9 LV IVS:        0.80 cm   LV e' lateral:   5.87 cm/s LVOT diam:     1.90 cm   LV E/e' lateral: 15.8 LV SV:         73 LV SV Index:   51 LVOT Area:     2.84 cm  RIGHT VENTRICLE RV S prime:     12.70 cm/s LEFT ATRIUM             Index        RIGHT ATRIUM           Index LA Vol (A2C):   41.2 ml 28.90 ml/m  RA Area:     17.40 cm LA Vol (A4C):   25.0 ml 17.54 ml/m   RA Volume:   42.80 ml  30.03 ml/m LA Biplane Vol: 33.6 ml 23.57 ml/m  AORTIC VALVE AV Area (Vmax):    2.27 cm AV Area (Vmean):   2.18 cm AV Area (VTI):     2.00 cm AV Vmax:           161.00 cm/s AV Vmean:          113.000 cm/s AV VTI:            0.363 m AV Peak Grad:      10.4 mmHg AV Mean Grad:      6.0 mmHg LVOT Vmax:         129.00 cm/s LVOT Vmean:        86.900 cm/s LVOT VTI:          0.256 m LVOT/AV VTI ratio: 0.71  AORTA Ao Root diam: 3.10 cm MITRAL VALVE                TRICUSPID VALVE MV Area (PHT): 2.93 cm     TR Peak grad:   25.4 mmHg MV E velocity: 93.00 cm/s   TR Vmax:        252.00 cm/s MV A velocity: 106.00 cm/s MV E/A ratio:  0.88         SHUNTS                             Systemic VTI:  0.26 m                             Systemic Diam: 1.90 cm Sheryle Donning MD Electronically signed by Sheryle Donning MD Signature Date/Time: 11/13/2023/12:45:29 PM    Final    DG Pelvis Portable Result Date: 11/12/2023 CLINICAL DATA:  Status post hip arthroplasty. EXAM: PORTABLE PELVIS 1-2 VIEWS COMPARISON:  Preoperative radiograph FINDINGS: Right hip arthroplasty in expected alignment. Fracture involving the intertrochanteric femur with displacement of the lesser trochanter, seen on preoperative exam. Recent postsurgical change includes air and edema in the soft tissues. IMPRESSION: Right hip arthroplasty in expected alignment. Electronically Signed   By: Chadwick Colonel M.D.   On: 11/12/2023 18:26   DG HIP UNILAT WITH PELVIS 1V RIGHT Result Date: 11/12/2023 CLINICAL DATA:  Right total hip arthroplasty EXAM: DG HIP (WITH OR WITHOUT PELVIS) 2V RIGHT COMPARISON:  11/11/2023 FINDINGS: A total of four images were obtained depicting the right hip and pelvis. The first image is a fluoroscopic spot image demonstrating a displaced intertrochanteric fracture of the right hip. There is likely some osteoid along the margins. Lesser trochanteric  fragment noted. The next images a frontal view of the lower  pelvis. No additional fractures are seen. The third image depicts a right total hip prosthesis in place although the final head of the right femoral component is not currently visible. There could be a radiolucent sizing head in place. The neck of the femoral prosthesis appears satisfactory aligned with the acetabular shell. The final image is centered on the lower pelvis and likewise shows the components of the hip prosthesis in place. IMPRESSION: 1. Right total hip prosthesis in place, although the final head of the right femoral component is not currently visible on the final images. There could be a radiolucent sizing head in place. 2. Displaced intertrochanteric fracture of the right hip. Electronically Signed   By: Freida Jes M.D.   On: 11/12/2023 17:23   DG C-Arm 1-60 Min-No Report Result Date: 11/12/2023 Fluoroscopy was utilized by the requesting physician.  No radiographic interpretation.   DG Chest Port 1 View Result Date: 11/11/2023 CLINICAL DATA:  med clearance.  Right hip deformity/fracture EXAM: PORTABLE CHEST 1 VIEW COMPARISON:  None Available. FINDINGS: The heart and mediastinal contours are within normal limits. Atherosclerotic plaque. Left base atelectasis. No focal consolidation. No pulmonary edema. No pleural effusion. No pneumothorax. No acute osseous abnormality. IMPRESSION: 1. No active disease. 2.  Aortic Atherosclerosis (ICD10-I70.0). Electronically Signed   By: Morgane  Naveau M.D.   On: 11/11/2023 21:39   DG Hip Unilat With Pelvis 2-3 Views Right Result Date: 11/11/2023 CLINICAL DATA:  med clearance Pt arriving via [facility] for fall from second step. Right hip deformity, EXAM: DG HIP (WITH OR WITHOUT PELVIS) 2-3V RIGHT COMPARISON:  X-ray pelvis 03/13/2010 FINDINGS: Acute markedly superiorly and laterally displaced right intertrochanteric femoral fracture. No right hip dislocation. No acute displaced fracture or dislocation of the left hip. No acute displaced fracture or  dislocation of the left hip on frontal view. No acute displaced fracture or diastasis of the bones of the pelvis. There is no evidence of arthropathy or other focal bone abnormality. Degenerative changes of the lower lumbar spine. IMPRESSION: Acute markedly superolaterally displaced right intertrochanteric femoral fracture. Electronically Signed   By: Morgane  Naveau M.D.   On: 11/11/2023 21:37   Microbiology: Results for orders placed or performed during the hospital encounter of 11/11/23  Urine Culture (for pregnant, neutropenic or urologic patients or patients with an indwelling urinary catheter)     Status: Abnormal   Collection Time: 11/12/23 12:12 PM   Specimen: Urine, Catheterized  Result Value Ref Range Status   Specimen Description   Final    URINE, CATHETERIZED Performed at [facility], [address], [place], [place] [address]    Special Requests   Final    NONE Performed at [facility], [address], [place], [place] [address]    Culture (A)  Final    10,000 COLONIES/mL ESCHERICHIA COLI Confirmed Extended Spectrum Beta-Lactamase Producer (ESBL).  In bloodstream infections from ESBL organisms, carbapenems are preferred over piperacillin/tazobactam. They are shown to have a lower risk of mortality.    Report Status 11/14/2023 FINAL  Final   Organism ID, Bacteria ESCHERICHIA COLI (A)  Final      Susceptibility   Escherichia coli - MIC*    AMPICILLIN >=32 RESISTANT Resistant     CEFAZOLIN  >=64 RESISTANT Resistant     CEFEPIME 16 RESISTANT Resistant     CEFTRIAXONE  >=64 RESISTANT Resistant     CIPROFLOXACIN  >=4 RESISTANT Resistant  GENTAMICIN <=1 SENSITIVE Sensitive     IMIPENEM <=0.25 SENSITIVE Sensitive     NITROFURANTOIN <=16 SENSITIVE Sensitive     TRIMETH/SULFA 80 RESISTANT Resistant     AMPICILLIN/SULBACTAM >=32 RESISTANT Resistant     PIP/TAZO <=4 SENSITIVE Sensitive ug/mL    * 10,000 COLONIES/mL ESCHERICHIA COLI   Culture, blood (Routine X 2) w Reflex to ID Panel     Status: None   Collection Time: 11/12/23 12:53 PM   Specimen: BLOOD LEFT HAND  Result Value Ref Range Status   Specimen Description   Final    BLOOD LEFT HAND Performed at [facility], [address] [place], [place] [address]    Special Requests   Final    BOTTLES DRAWN AEROBIC AND ANAEROBIC Blood Culture adequate volume Performed at [facility], [address], [place], [place] [address]    Culture   Final    NO GROWTH 5 DAYS Performed at [facility], [address], [place], [place] [address]    Report Status 11/17/2023 FINAL  Final  Culture, blood (Routine X 2) w Reflex to ID Panel     Status: Abnormal   Collection Time: 11/12/23 12:59 PM   Specimen: BLOOD  Result Value Ref Range Status   Specimen Description   Final    BLOOD Performed at [facility], [address], [place], [place] [address]    Special Requests   Final    BOTTLES DRAWN AEROBIC AND ANAEROBIC Blood Culture adequate volume Performed at [facility], [address], [place], [place] [address]    Culture  Setup Time   Final    GRAM NEGATIVE RODS ANAEROBIC BOTTLE ONLY PHARMD E JACKSON 11/14/2023 @ 0221 BY AB    Culture (A)  Final    BACTEROIDES UNIFORMIS BETA LACTAMASE POSITIVE Performed at [facility], [address], [place], [place] [address]    Report Status 11/15/2023 FINAL  Final  Blood Culture ID Panel (Reflexed)     Status: None   Collection Time: 11/12/23 12:59 PM  Result Value Ref Range Status   Enterococcus faecalis NOT DETECTED NOT DETECTED Final   Enterococcus Faecium NOT DETECTED NOT DETECTED Final   Listeria monocytogenes NOT DETECTED NOT DETECTED Final   Staphylococcus species NOT DETECTED NOT DETECTED Final   Staphylococcus aureus (BCID) NOT DETECTED NOT DETECTED Final   Staphylococcus epidermidis NOT DETECTED NOT DETECTED Final   Staphylococcus lugdunensis NOT  DETECTED NOT DETECTED Final   Streptococcus species NOT DETECTED NOT DETECTED Final   Streptococcus agalactiae NOT DETECTED NOT DETECTED Final   Streptococcus pneumoniae NOT DETECTED NOT DETECTED Final   Streptococcus pyogenes NOT DETECTED NOT DETECTED Final   A.calcoaceticus-baumannii NOT DETECTED NOT DETECTED Final   Bacteroides fragilis NOT DETECTED NOT DETECTED Final   Enterobacterales NOT DETECTED NOT DETECTED Final   Enterobacter cloacae complex NOT DETECTED NOT DETECTED Final   Escherichia coli NOT DETECTED NOT DETECTED Final   Klebsiella aerogenes NOT DETECTED NOT DETECTED Final   Klebsiella oxytoca NOT DETECTED NOT DETECTED Final   Klebsiella pneumoniae NOT DETECTED NOT DETECTED Final   Proteus species NOT DETECTED NOT DETECTED Final   Salmonella species NOT DETECTED NOT DETECTED Final   Serratia marcescens NOT DETECTED NOT DETECTED Final   Haemophilus influenzae NOT DETECTED NOT DETECTED Final   Neisseria meningitidis NOT DETECTED NOT DETECTED Final   Pseudomonas aeruginosa NOT DETECTED NOT DETECTED Final   Stenotrophomonas maltophilia NOT DETECTED NOT DETECTED Final  Candida albicans NOT DETECTED NOT DETECTED Final   Candida auris NOT DETECTED NOT DETECTED Final   Candida glabrata NOT DETECTED NOT DETECTED Final   Candida krusei NOT DETECTED NOT DETECTED Final   Candida parapsilosis NOT DETECTED NOT DETECTED Final   Candida tropicalis NOT DETECTED NOT DETECTED Final   Cryptococcus neoformans/gattii NOT DETECTED NOT DETECTED Final    Comment: Performed at [facility], [address], [place], [place] [address]  MRSA Next Gen by PCR, Nasal     Status: None   Collection Time: 11/12/23 10:48 PM   Specimen: Nasal Mucosa; Nasal Swab  Result Value Ref Range Status   MRSA by PCR Next Gen NOT DETECTED NOT DETECTED Final    Comment: (NOTE) The GeneXpert MRSA Assay (FDA approved for NASAL specimens only), is one component of a comprehensive MRSA colonization  surveillance program. It is not intended to diagnose MRSA infection nor to guide or monitor treatment for MRSA infections. Test performance is not FDA approved in patients less than 20 years old. Performed at [facility], [address], [place], [place] [address]   C Difficile Quick Screen w PCR reflex     Status: None   Collection Time: 11/13/23  7:50 PM   Specimen: STOOL  Result Value Ref Range Status   C Diff antigen NEGATIVE NEGATIVE Final   C Diff toxin NEGATIVE NEGATIVE Final   C Diff interpretation No C. difficile detected.  Final    Comment: Performed at [facility], [address], [place], [place] [address]  Gastrointestinal Panel by PCR , Stool     Status: None   Collection Time: 11/14/23 10:29 AM   Specimen: Stool  Result Value Ref Range Status   Campylobacter species NOT DETECTED NOT DETECTED Final   Plesimonas shigelloides NOT DETECTED NOT DETECTED Final   Salmonella species NOT DETECTED NOT DETECTED Final   Yersinia enterocolitica NOT DETECTED NOT DETECTED Final   Vibrio species NOT DETECTED NOT DETECTED Final   Vibrio cholerae NOT DETECTED NOT DETECTED Final   Enteroaggregative E coli (EAEC) NOT DETECTED NOT DETECTED Final   Enteropathogenic E coli (EPEC) NOT DETECTED NOT DETECTED Final   Enterotoxigenic E coli (ETEC) NOT DETECTED NOT DETECTED Final   Shiga like toxin producing E coli (STEC) NOT DETECTED NOT DETECTED Final   Shigella/Enteroinvasive E coli (EIEC) NOT DETECTED NOT DETECTED Final   Cryptosporidium NOT DETECTED NOT DETECTED Final   Cyclospora cayetanensis NOT DETECTED NOT DETECTED Final   Entamoeba histolytica NOT DETECTED NOT DETECTED Final   Giardia lamblia NOT DETECTED NOT DETECTED Final   Adenovirus F40/41 NOT DETECTED NOT DETECTED Final   Astrovirus NOT DETECTED NOT DETECTED Final   Norovirus GI/GII NOT DETECTED NOT DETECTED Final   Rotavirus A NOT DETECTED NOT DETECTED Final   Sapovirus (I, II, IV, and  V) NOT DETECTED NOT DETECTED Final    Comment: Performed at [facility], [address] [place], [place] [address]   Labs: CBC: Recent Labs  Lab 11/15/23 0327 11/16/23 1020 11/17/23 0337 11/18/23 0316 11/19/23 0350  WBC 5.4 6.1 5.6 6.2 5.9  NEUTROABS 3.4 4.0 3.4 3.8 3.5  HGB 8.3* 10.1* 9.0* 9.7* 9.7*  HCT 25.0* 30.4* 28.4* 29.2* 30.2*  MCV 94.0 95.0 96.3 94.2 98.1  PLT 100* 159 161 215 233   Basic Metabolic Panel: Recent Labs  Lab 11/15/23 0327 11/16/23 1020 11/17/23 0337 11/18/23 0316 11/19/23 0350  NA 135 137 137 136 139  K 3.1*  4.1 4.0 4.2 4.2  CL 103 105 108 106 108  CO2 26 25 23 25 24   GLUCOSE 109* 160* 90 107* 117*  BUN 12 8 10 17 17   CREATININE 0.61 0.74 0.61 0.65 0.80  CALCIUM  6.6* 7.4* 7.2* 8.1* 7.9*  MG 1.9 1.9 1.9 2.0 2.1  PHOS 2.6 1.5* 2.5 3.3 3.3   Liver Function Tests: Recent Labs  Lab 11/15/23 0327 11/16/23 1020 11/17/23 0337 11/18/23 0316 11/19/23 0350  AST 26 37 34 45* 41  ALT 8 11 13 18 21   ALKPHOS 18* 23* 22* 25* 25*  BILITOT 0.8 1.0 1.3* 1.4* 1.2  PROT 3.9* 4.3* 4.1* 4.6* 4.5*  ALBUMIN  2.3* 2.4* 2.2* 2.4* 2.5*   CBG: No results for input(s): GLUCAP in the last 168 hours.  Discharge time spent: greater than 30 minutes.  Signed: Aura Leeds, DO [facility] 11/19/2023

## 2023-11-19 NOTE — Progress Notes (Signed)
 Physical Therapy Treatment Patient Details Name: Janet Cooper MRN: [id number] DOB: 1929/06/13 Today's Date: 11/19/2023   History of Present Illness Kirah Donley is a 89 year old female admitted on 11/11/23 s/p fall in her garage down steps sustaining R intertrochanteric femoral fx with R THA anterior approach on 11/12/23. Complications of hypotension, low Hgb,and GNR Bacteremia requiring pressors (short term, now weaned), PRBC, and transfer to ICU. PMH: hypertension, hyperlipidemia    PT Comments  Pt progressing well this session, amb 40' with RW and min assist, no dizziness during session.  Patient will benefit from continued follow up therapy, <3 hours/day.   Will continue to follow in acute session.     If plan is discharge home, recommend the following: A little help with walking and/or transfers;A little help with bathing/dressing/bathroom;Assistance with cooking/housework;Assist for transportation;Help with stairs or ramp for entrance   Can travel by private vehicle     No  Equipment Recommendations  Rolling walker (2 wheels)    Recommendations for Other Services       Precautions / Restrictions Precautions Precautions: Fall Recall of Precautions/Restrictions: Intact Precaution/Restrictions Comments: incontinent Restrictions Weight Bearing Restrictions Per Provider Order: No RLE Weight Bearing Per Provider Order: Weight bearing as tolerated     Mobility  Bed Mobility Overal bed mobility: Needs Assistance Bed Mobility: Supine to Sit, Sit to Supine     Supine to sit: Min assist, HOB elevated Sit to supine: Min assist   General bed mobility comments: assist for RLE, cues for technique and self assist with gait belt    Transfers Overall transfer level: Needs assistance Equipment used: Rolling walker (2 wheels) Transfers: Sit to/from Stand Sit to Stand: Min assist           General transfer comment: Cues for safety, technique, hand placement. Increased  time.    Ambulation/Gait Ambulation/Gait assistance: Min assist, Contact guard assist Gait Distance (Feet): 40 Feet Assistive device: Rolling walker (2 wheels) Gait Pattern/deviations: Step-through pattern, Decreased stride length       General Gait Details: cues for sequence, light assist to steady and manage RW   Stairs             Wheelchair Mobility     Tilt Bed    Modified Rankin (Stroke Patients Only)       Balance                                            Communication Communication Communication: Impaired  Cognition Arousal: Alert Behavior During Therapy: WFL for tasks assessed/performed   PT - Cognitive impairments: No apparent impairments                         Following commands: Intact      Cueing Cueing Techniques: Verbal cues  Exercises General Exercises - Lower Extremity Ankle Circles/Pumps: AROM, Both, 10 reps Heel Slides: AAROM, Right, 5 reps    General Comments General comments (skin integrity, edema, etc.): denies dizziness this session; assisted with depends changed d/t school incontinence.      Pertinent Vitals/Pain Pain Assessment Pain Assessment: Faces Faces Pain Scale: Hurts little more Pain Location: R thigh with movement Pain Descriptors / Indicators: Aching, Discomfort Pain Intervention(s): Limited activity within patient's tolerance, Monitored during session, Repositioned    Home Living  Prior Function            PT Goals (current goals can now be found in the care plan section) Acute Rehab PT Goals Patient Stated Goal: feel better PT Goal Formulation: With patient Time For Goal Achievement: 11/29/23 Potential to Achieve Goals: Good Progress towards PT goals: Progressing toward goals    Frequency    Min 2X/week      PT Plan      Co-evaluation              AM-PAC PT 6 Clicks Mobility   Outcome Measure  Help needed turning  from your back to your side while in a flat bed without using bedrails?: A Little Help needed moving from lying on your back to sitting on the side of a flat bed without using bedrails?: A Little Help needed moving to and from a bed to a chair (including a wheelchair)?: A Little Help needed standing up from a chair using your arms (e.g., wheelchair or bedside chair)?: A Little Help needed to walk in hospital room?: A Little Help needed climbing 3-5 steps with a railing? : A Little 6 Click Score: 18    End of Session Equipment Utilized During Treatment: Gait belt Activity Tolerance: Patient tolerated treatment well Patient left: in bed;with call bell/phone within reach;with bed alarm set   PT Visit Diagnosis: Other abnormalities of gait and mobility (R26.89);Muscle weakness (generalized) (M62.81);Dizziness and giddiness (R42)     Time: 1610-9604 PT Time Calculation (min) (ACUTE ONLY): 20 min  Charges:    $Gait Training: 8-22 mins PT General Charges $$ ACUTE PT VISIT: 1 Visit                     Albeiro Trompeter, PT  Acute Rehab Dept [facility]) [phone]  11/19/2023    [facility] 11/19/2023, 5:21 PM

## 2023-11-20 DIAGNOSIS — S72001P Fracture of unspecified part of neck of right femur, subsequent encounter for closed fracture with malunion: Secondary | ICD-10-CM | POA: Diagnosis not present

## 2023-11-20 MED ORDER — OXYCODONE HCL 5 MG PO TABS: 2.5000 mg | ORAL_TABLET | ORAL | 0 refills | Status: DC | PRN

## 2023-11-20 MED ORDER — OXYCODONE HCL 5 MG PO TABS: 5.0000 mg | ORAL_TABLET | Freq: Four times a day (QID) | ORAL | 0 refills | Status: AC | PRN

## 2023-11-20 NOTE — Progress Notes (Signed)
   Subjective: 8 Days Post-Op Procedure(s) (LRB): ARTHROPLASTY, HIP, TOTAL, ANTERIOR APPROACH (Right) Patient reports pain as mild.   Patient seen in rounds with Dr. Bernard Brick. Patient is resting in bed on exam this morning. No acute events overnight. She had questions about SNF coverage based on her insurance. She locates her pain to the distal thigh.    Objective: Vital signs in last 24 hours: Temp:  [98 F (36.7 C)-98.6 F (37 C)] 98.1 F (36.7 C) (06/18 0544) Pulse Rate:  [71-81] 71 (06/18 0544) Resp:  [16-18] 18 (06/18 0544) BP: (101-120)/(64-65) 120/65 (06/18 0544) SpO2:  [96 %-100 %] 100 % (06/18 0544)  Intake/Output from previous day:  Intake/Output Summary (Last 24 hours) at 11/20/2023 0749 Last data filed at 11/20/2023 0600 Gross per 24 hour  Intake 240 ml  Output 1700 ml  Net -1460 ml     Intake/Output this shift: No intake/output data recorded.  Labs: Recent Labs    11/18/23 0316 11/19/23 0350  HGB 9.7* 9.7*   Recent Labs    11/18/23 0316 11/19/23 0350  WBC 6.2 5.9  RBC 3.10* 3.08*  HCT 29.2* 30.2*  PLT 215 233   Recent Labs    11/18/23 0316 11/19/23 0350  NA 136 139  K 4.2 4.2  CL 106 108  CO2 25 24  BUN 17 17  CREATININE 0.65 0.80  GLUCOSE 107* 117*  CALCIUM  8.1* 7.9*   No results for input(s): LABPT, INR in the last 72 hours.  Exam: General - Patient is Alert and Oriented Extremity - Neurologically intact Sensation intact distally Intact pulses distally Dorsiflexion/Plantar flexion intact Dressing - dressing C/D/I Motor Function - intact, moving foot and toes well on exam.   Past Medical History:  Diagnosis Date   Arthritis    back   Chronic cystitis    Detrusor instability of bladder    Fecal incontinence    Glaucoma of both eyes    History of endometrial cancer    1990 dx  Stage IB, Grade 1 endometrial carcinoma  s/p TAH w/ BSO--  recurrence 1994 vaginal cuff  s/p radiation   History of radiation therapy    1994  lower  pelvic area for recurrent endometrial cancer at vaginal cuff   Hyperlipidemia    Hypertension    OAB (overactive bladder)    Osteopenia 05/2013   T score -2.1 FRAX 8.9%/2.5%   Urge urinary incontinence    Wears glasses     Assessment/Plan: 8 Days Post-Op Procedure(s) (LRB): ARTHROPLASTY, HIP, TOTAL, ANTERIOR APPROACH (Right) Principal Problem:   Closed right hip fracture (HCC) Active Problems:   Hyperlipidemia   Hypertension   S/P total right hip arthroplasty   Anemia   Hypotension   Intertrochanteric fracture of right hip, closed, initial encounter (HCC)   Shock (HCC)   DNR (do not resuscitate)  Estimated body mass index is 24.11 kg/m as calculated from the following:   Height as of this encounter: 5' (1.524 m).   Weight as of this encounter: 56 kg. Advance diet Up with therapy D/C IV fluids  DVT Prophylaxis - Aspirin  Weight bearing as tolerated.  Plan is to go Skilled nursing facility after hospital stay. Plan for discharge today after meeting goals with therapy. Follow up in the office in 1 week.   Kim Pen, PA-C Orthopedic Surgery [phone] 11/20/2023, 7:49 AM

## 2023-11-20 NOTE — Plan of Care (Signed)
  Problem: Pain Managment: Goal: General experience of comfort will improve and/or be controlled Outcome: Progressing   Problem: Safety: Goal: Ability to remain free from injury will improve Outcome: Progressing

## 2023-11-20 NOTE — TOC Transition Note (Signed)
 Transition of Care [facility]) - Discharge Note   Patient Details  Name: Janet Cooper MRN: [id number] Date of Birth: November 24, 1929  [facility] CM/SW Contact:  Delilah Fend, LCSW Phone Number: 11/20/2023, 11:30 AM   Clinical Narrative:     Met with patient this morning and she has accepted SNF bed at [facility].  MD has medically cleared pt for dc to facility today.  Have received insurance authorization.  Pt and friend, Adline Aho, aware and agreeable.  PTAR called at 11:30am.  RN has called report.  No further TOC needs.  Final next level of care: Skilled Nursing Facility Barriers to Discharge: Barriers Resolved   Patient Goals and CMS Choice Patient states their goals for this hospitalization and ongoing recovery are:: return home following rehab          Discharge Placement PASRR number recieved: 11/17/23            Patient chooses bed at: [facility] Patient to be transferred to facility by: PTAR Name of family member notified: friend, Adline Aho Patient and family notified of of transfer: 11/20/23  Discharge Plan and Services Additional resources added to the After Visit Summary for                  DME Arranged: N/A DME Agency: NA                  Social Drivers of Health (SDOH) Interventions SDOH Screenings   Food Insecurity: Patient Unable To Answer (11/13/2023)  Housing: Patient Unable To Answer (11/13/2023)  Utilities: Patient Unable To Answer (11/13/2023)  Social Connections: Patient Unable To Answer (11/13/2023)  Tobacco Use: Low Risk  (11/12/2023)     Readmission Risk Interventions    11/14/2023   11:13 AM  Readmission Risk Prevention Plan  Post Dischage Appt Complete  Medication Screening Complete  Transportation Screening Complete

## 2023-11-20 NOTE — Discharge Summary (Addendum)
 Physician Discharge Summary   Patient: Janet Cooper MRN: [id number] DOB: 01-19-1930  Admit date:     11/11/2023  Discharge date: 11/20/23  Discharge Physician: Elvin Hammer MD   PCP: Lory Rough., PA-C   Recommendations at discharge:   Follow-up with PCP within 1 to 2 weeks repeat CBC, CMP, mag, Phos within 1 week Follow-up with orthopedic surgery in outpatient setting within 1 to 2 weeks Follow-up with Palliative Care medicine at SNF Discontinue Antihypertensives at Discharge, reinitiate as blood pressure improves. Continue Foley catheter for now and do a voiding trial in 3 to 5 days once mobility improves.  Discharge Diagnoses: Principal Problem:   Closed right hip fracture (HCC) Active Problems:   Hyperlipidemia   Hypertension   S/P total right hip arthroplasty   Anemia   Hypotension   Intertrochanteric fracture of right hip, closed, initial encounter (HCC)   Shock (HCC)   DNR (do not resuscitate)  Resolved Problems:   * No resolved hospital problems. [facility] Course: The patient is an 89 year old female history of hypertension, hyperlipidemia presenting to the ED while was walking down 2 steps into her garage and landed on her right side with significant right hip pain. Imaging done consistent with right hip fracture.  She underwent surgical intervention but was also noted to be hypotensive.  Antihypertensives were held and she is typed and screened transfuse 1 unit PRBCs. Transferred to the ICU.  Pulmonary critical care was consulted given her hypotension she had to be placed on low-dose Neo-Synephrine which has been weaned off.  Hemoglobin has stablized. PT/OT was recommending CIR but unlike to be approved so now recommendation is for SNF. GI Pathogen Panel Negative so will add Antidiarrheals and diarrhea is slowly improving.. BP improving and will CTM. Improving slowly and transferred to Telemetry 11/16/23.    Assessment and Plan:  Right Intertrochanteric  Femoral Fracture Secondary to mechanical fall:  Patient noted to be hypotensive and have low blood pressure which she states is at baseline and on antihypertensive medications. Patient states over the past few days has been having feelings of motion sickness and just not feeling too well with some nausea. Patient seen in consultation by orthopedics and patient underwent Surgical Repair 11/12/23. PT/OT to evaluate and treat and recently recommended CIR but they do not think she is a candidate so no pursuing SNF.She appears medically stable for discharge at this time now   Hypotension with shock: IMPROVED. Patient noted with systolic blood pressures in the 70s to 80s on presentation to the ED however patient alert oriented and answering questions appropriately.  Shock is likely multifactorial given her blood loss, pain meds, Bacteremia and antihypertensives; patient states on antihypertensive medications which she was told to continue per PCP despite low/soft blood pressure. ECHOCardiogram and showed an EF of 65-70% w/ no RWMA; Left ventricular diastolic parameters are indeterminate. And RVSF was normal, received PRBC during hospitalization.-Palliative care was consulted.  Patient is DNR.  GNR Bacteremia: 1/4 Bottles +. Was on IV Ceftriaxone  and Flagyl  and will be changed to change to po Augmentin to complete 7 day course. Nothing was positive on BCID but likely Anaerobe and was showing Bacteroides Uniformis that was Beta Lactamase positive. CTM and follow up in the outpatient setting  Nausea and Diarrhea: Improving.  C Diff and GI Pathogen Panel Negative. C/w Supportive Care and Antidiarrheals w/ Loperamide. IVF as below to stop. Consulted Palliative Care Medicine given her frustration w/ Diarrhea but it has been chronic over  10-15 years. She has had Fecal Incontinence for a little while now and appears stable   Dyspnea and SOB: Improved. Checked CXR which showed Small bilateral pleural effusions, left greater  than right and  Dense left lower lobe consolidation, which may reflect atelectasis or pneumonia. She is now -1,793 mL since Admission. C/w Xopenex/Atrovent PRN.  Appears stable at this time.  Acute Urinary Retention: Foley was placed in during hospitalization which was initially discontinued but required it subsequently so we will continue for now.  Do outpatient voiding trial while mobility improves.  essential Hypertension: Hold antihypertensive medications reinitiate as outpatient as blood pressure improves.   Normocytic Anemia: Patient noted to have a drop in hemoglobin of 8.2 from 12.0 on admission.  Received PRBC transfusion.  Thrombocytopenia: Resolved  Metabolic Acidosis: Improved.    Hyperlipidemia: C/w Simvastatin  10 mg po Daily  Hypokalemia: Resolved after replacement.  Hypophosphatemia: Improved. Phos Level is now 3.3.   Hypomagnesemia: Improved. Mag Level is now 2.1.   Hyperbilirubinemia: Mild.  Improved  Hypoalbuminemia: Patient's Albumin  Level is now 2.5.  Would benefit from nutrition supplementation.  Nutrition Documentation    Flowsheet Row ED to Hosp-Admission (Current) from 11/11/2023 in [facility]  Nutrition Problem Increased nutrient needs  Etiology hip fracture  Nutrition Goal Patient will meet greater than or equal to 90% of their needs  Interventions Refer to RD note for recommendations, Ensure Enlive (each supplement provides 350kcal and 20 grams of protein), Magic cup   Consultants: PCCM, Orthopedic Surgery, Palliative Care Medicine  Procedures performed: As delineated as above   Disposition: Skilled nursing facility  Diet recommendation:  Cardiac diet  DISCHARGE MEDICATION: Allergies as of 11/20/2023       Reactions   Homatropine Itching   Hydrocodone Itching   Sulfa Antibiotics Itching   Sulfamethoxazole Itching        Medication List     STOP taking these medications    amLODipine-olmesartan 5-40 MG  tablet Commonly known as: AZOR   methocarbamol  500 MG tablet Commonly known as: Robaxin    predniSONE  10 MG (21) Tbpk tablet Commonly known as: STERAPRED UNI-PAK 21 TAB       TAKE these medications    amoxicillin-clavulanate 875-125 MG tablet Commonly known as: AUGMENTIN Take 1 tablet by mouth every 12 (twelve) hours for 4 doses.   aspirin  EC 81 MG tablet Take 81 mg by mouth.   B-complex with vitamin C tablet Take 1 tablet by mouth daily.   Calcium  Citrate-Vitamin D  200-250 MG-UNIT Tabs Take 1 tablet by mouth daily.   dorzolamide  2 % ophthalmic solution Commonly known as: TRUSOPT  Place 1 drop into both eyes 2 (two) times daily.   famotidine  20 MG tablet Commonly known as: PEPCID  Take 1 tablet (20 mg total) by mouth daily.   feeding supplement Liqd Take 237 mLs by mouth 2 (two) times daily between meals.   glucosamine-chondroitin 500-400 MG tablet Take 1 tablet by mouth daily.   hydrOXYzine 25 MG tablet Commonly known as: ATARAX Take 1 tablet (25 mg total) by mouth 3 (three) times daily as needed for anxiety.   ipratropium 0.02 % nebulizer solution Commonly known as: ATROVENT Take 2.5 mLs (0.5 mg total) by nebulization every 6 (six) hours as needed for shortness of breath.   latanoprost  0.005 % ophthalmic solution Commonly known as: XALATAN  INSTILL 1 DROP INTO BOTH EYES AT BEDTIME   levalbuterol 0.63 MG/3ML nebulizer solution Commonly known as: XOPENEX Take 3 mLs (0.63 mg total) by nebulization every  6 (six) hours as needed for wheezing.   loperamide 2 MG capsule Commonly known as: IMODIUM Take 1 capsule (2 mg total) by mouth as needed for diarrhea or loose stools.   MAGNESIUM  PO Take 1 tablet by mouth daily.   midodrine  2.5 MG tablet Commonly known as: PROAMATINE  Take 1 tablet (2.5 mg total) by mouth 3 (three) times daily with meals.   multivitamin with minerals Tabs tablet Take 1 tablet by mouth daily.   omeprazole 40 MG capsule Commonly known  as: PRILOSEC Take 40 mg by mouth daily.   ondansetron  4 MG tablet Commonly known as: ZOFRAN  Take 1 tablet (4 mg total) by mouth every 6 (six) hours as needed for nausea.   oxyCODONE  5 MG immediate release tablet Commonly known as: Oxy IR/ROXICODONE  Take 0.5-1 tablets (2.5-5 mg total) by mouth every 4 (four) hours as needed for up to 10 doses for moderate pain (pain score 4-6) (pain score 4-6).   Prolia 60 MG/ML Sosy injection Generic drug: denosumab Inject 60 mg into the skin every 6 (six) months.   saccharomyces boulardii 250 MG capsule Commonly known as: Florastor Take 1 capsule (250 mg total) by mouth 2 (two) times daily.   simvastatin  10 MG tablet Commonly known as: ZOCOR  TAKE 1 TABLET BY MOUTH EVERY DAY NIGHTLY   trimethoprim  100 MG tablet Commonly known as: TRIMPEX  Take 100 mg by mouth every evening.   Trospium Chloride 60 MG Cp24 Take 1 tablet by mouth daily.        Follow-up Information     Claiborne Crew, MD. Schedule an appointment as soon as possible for a visit in 2 week(s).   Specialty: Orthopedic Surgery Contact information: [address] [place] [address] [phone]                Discharge Exam: [facility] Weights   11/12/23 1411 11/15/23 0340  Weight: 48.1 kg 56 kg   Vitals:   11/19/23 2149 11/20/23 0544  BP: 101/64 120/65  Pulse: 76 71  Resp: 17 18  Temp: 98.6 F (37 C) 98.1 F (36.7 C)  SpO2: 96% 100%   Examination: Physical Exam:  Constitutional: Thin elderly female in no acute distress Respiratory: Diminished to auscultation bilaterally, no wheezing, rales, rhonchi or crackles. Normal respiratory effort and patient is not tachypenic. No accessory muscle use.  Unlabored breathing Cardiovascular: RRR, no murmurs / rubs / gallops. S1 and S2 auscultated. No extremity edema.  Abdomen: Soft, non-tender, non-distended. Bowel sounds positive.  GU: Deferred. Musculoskeletal: No clubbing / cyanosis of digits/nails. No  joint deformity upper and lower extremities.  Skin: No rashes, lesions, ulcers on limited skin evaluation. No induration; Warm and dry.  Neurologic: CN 2-12 grossly intact with no focal deficits.  Psychiatric: Normal judgment and insight. Alert and oriented x 3. A little anxious  Condition at discharge: stable  The results of significant diagnostics from this hospitalization (including imaging, microbiology, ancillary and laboratory) are listed below for reference.   Imaging Studies: DG CHEST PORT 1 VIEW Result Date: 11/16/2023 CLINICAL DATA:  Short of breath, right hip surgery 4 days ago EXAM: PORTABLE CHEST 1 VIEW COMPARISON:  11/11/2023 FINDINGS: Single frontal view of the chest demonstrates mild enlargement the cardiac silhouette. Dense left lower lobe consolidation with small left pleural effusion. Trace right pleural effusion. No pneumothorax. No acute bony abnormalities. IMPRESSION: 1. Small bilateral pleural effusions, left greater than right. 2. Dense left lower lobe consolidation, which may reflect atelectasis or pneumonia. Electronically Signed  By: Bobbye Burrow M.D.   On: 11/16/2023 19:44   ECHOCARDIOGRAM COMPLETE Result Date: 11/13/2023    ECHOCARDIOGRAM REPORT   Patient Name:   EVERLEE QUAKENBUSH Date of Exam: 11/13/2023 Medical Rec #:  [id number]          Height:       60.0 in Accession #:    [id number]         Weight:       106.0 lb Date of Birth:  13-Aug-1929          BSA:          1.425 m Patient Age:    89 years           BP:           114/59 mmHg Patient Gender: F                  HR:           75 bpm. Exam Location:  Inpatient Procedure: 2D Echo, Cardiac Doppler and Color Doppler (Both Spectral and Color            Flow Doppler were utilized during procedure). Indications:    Shock R57.9  History:        Patient has no prior history of Echocardiogram examinations.                 Risk Factors:Hypertension.  Sonographer:    Astrid Blamer Referring Phys: [id number] OMAR Macario Savin   Sonographer Comments: Image acquisition challenging due to respiratory motion. IMPRESSIONS  1. Left ventricular ejection fraction, by estimation, is 65 to 70%. The left ventricle has normal function. The left ventricle has no regional wall motion abnormalities. Left ventricular diastolic parameters are indeterminate.  2. Right ventricular systolic function is normal. The right ventricular size is not well visualized.  3. The mitral valve is grossly normal. Trivial mitral valve regurgitation. No evidence of mitral stenosis.  4. The aortic valve is grossly normal. There is mild calcification of the aortic valve. Aortic valve regurgitation is not visualized. No aortic stenosis is present. Comparison(s): No prior Echocardiogram. Conclusion(s)/Recommendation(s): Normal biventricular function without evidence of hemodynamically significant valvular heart disease. FINDINGS  Left Ventricle: Left ventricular ejection fraction, by estimation, is 65 to 70%. The left ventricle has normal function. The left ventricle has no regional wall motion abnormalities. The left ventricular internal cavity size was normal in size. There is  no left ventricular hypertrophy. Left ventricular diastolic parameters are indeterminate. Right Ventricle: The right ventricular size is not well visualized. Right vetricular wall thickness was not well visualized. Right ventricular systolic function is normal. Left Atrium: Left atrial size was normal in size. Right Atrium: Right atrial size was normal in size. Pericardium: There is no evidence of pericardial effusion. Mitral Valve: The mitral valve is grossly normal. Trivial mitral valve regurgitation. No evidence of mitral valve stenosis. Tricuspid Valve: The tricuspid valve is grossly normal. Tricuspid valve regurgitation is mild . No evidence of tricuspid stenosis. Aortic Valve: The aortic valve is grossly normal. There is mild calcification of the aortic valve. Aortic valve regurgitation is not  visualized. No aortic stenosis is present. Aortic valve mean gradient measures 6.0 mmHg. Aortic valve peak gradient measures 10.4 mmHg. Aortic valve area, by VTI measures 2.00 cm. Pulmonic Valve: The pulmonic valve was not well visualized. Pulmonic valve regurgitation is not visualized. No evidence of pulmonic stenosis. Aorta: The aortic root is normal in size and structure. Venous: The inferior  vena cava was not well visualized. IAS/Shunts: The atrial septum is grossly normal.  LEFT VENTRICLE PLAX 2D LVIDd:         4.20 cm   Diastology LVIDs:         2.20 cm   LV e' medial:    4.24 cm/s LV PW:         0.80 cm   LV E/e' medial:  21.9 LV IVS:        0.80 cm   LV e' lateral:   5.87 cm/s LVOT diam:     1.90 cm   LV E/e' lateral: 15.8 LV SV:         73 LV SV Index:   51 LVOT Area:     2.84 cm  RIGHT VENTRICLE RV S prime:     12.70 cm/s LEFT ATRIUM             Index        RIGHT ATRIUM           Index LA Vol (A2C):   41.2 ml 28.90 ml/m  RA Area:     17.40 cm LA Vol (A4C):   25.0 ml 17.54 ml/m  RA Volume:   42.80 ml  30.03 ml/m LA Biplane Vol: 33.6 ml 23.57 ml/m  AORTIC VALVE AV Area (Vmax):    2.27 cm AV Area (Vmean):   2.18 cm AV Area (VTI):     2.00 cm AV Vmax:           161.00 cm/s AV Vmean:          113.000 cm/s AV VTI:            0.363 m AV Peak Grad:      10.4 mmHg AV Mean Grad:      6.0 mmHg LVOT Vmax:         129.00 cm/s LVOT Vmean:        86.900 cm/s LVOT VTI:          0.256 m LVOT/AV VTI ratio: 0.71  AORTA Ao Root diam: 3.10 cm MITRAL VALVE                TRICUSPID VALVE MV Area (PHT): 2.93 cm     TR Peak grad:   25.4 mmHg MV E velocity: 93.00 cm/s   TR Vmax:        252.00 cm/s MV A velocity: 106.00 cm/s MV E/A ratio:  0.88         SHUNTS                             Systemic VTI:  0.26 m                             Systemic Diam: 1.90 cm Sheryle Donning MD Electronically signed by Sheryle Donning MD Signature Date/Time: 11/13/2023/12:45:29 PM    Final    DG Pelvis Portable Result Date:  11/12/2023 CLINICAL DATA:  Status post hip arthroplasty. EXAM: PORTABLE PELVIS 1-2 VIEWS COMPARISON:  Preoperative radiograph FINDINGS: Right hip arthroplasty in expected alignment. Fracture involving the intertrochanteric femur with displacement of the lesser trochanter, seen on preoperative exam. Recent postsurgical change includes air and edema in the soft tissues. IMPRESSION: Right hip arthroplasty in expected alignment. Electronically Signed   By: Chadwick Colonel M.D.   On: 11/12/2023 18:26   DG HIP UNILAT WITH PELVIS 1V  RIGHT Result Date: 11/12/2023 CLINICAL DATA:  Right total hip arthroplasty EXAM: DG HIP (WITH OR WITHOUT PELVIS) 2V RIGHT COMPARISON:  11/11/2023 FINDINGS: A total of four images were obtained depicting the right hip and pelvis. The first image is a fluoroscopic spot image demonstrating a displaced intertrochanteric fracture of the right hip. There is likely some osteoid along the margins. Lesser trochanteric fragment noted. The next images a frontal view of the lower pelvis. No additional fractures are seen. The third image depicts a right total hip prosthesis in place although the final head of the right femoral component is not currently visible. There could be a radiolucent sizing head in place. The neck of the femoral prosthesis appears satisfactory aligned with the acetabular shell. The final image is centered on the lower pelvis and likewise shows the components of the hip prosthesis in place. IMPRESSION: 1. Right total hip prosthesis in place, although the final head of the right femoral component is not currently visible on the final images. There could be a radiolucent sizing head in place. 2. Displaced intertrochanteric fracture of the right hip. Electronically Signed   By: Freida Jes M.D.   On: 11/12/2023 17:23   DG C-Arm 1-60 Min-No Report Result Date: 11/12/2023 Fluoroscopy was utilized by the requesting physician.  No radiographic interpretation.   DG Chest Port  1 View Result Date: 11/11/2023 CLINICAL DATA:  med clearance.  Right hip deformity/fracture EXAM: PORTABLE CHEST 1 VIEW COMPARISON:  None Available. FINDINGS: The heart and mediastinal contours are within normal limits. Atherosclerotic plaque. Left base atelectasis. No focal consolidation. No pulmonary edema. No pleural effusion. No pneumothorax. No acute osseous abnormality. IMPRESSION: 1. No active disease. 2.  Aortic Atherosclerosis (ICD10-I70.0). Electronically Signed   By: Morgane  Naveau M.D.   On: 11/11/2023 21:39   DG Hip Unilat With Pelvis 2-3 Views Right Result Date: 11/11/2023 CLINICAL DATA:  med clearance Pt arriving via [facility] for fall from second step. Right hip deformity, EXAM: DG HIP (WITH OR WITHOUT PELVIS) 2-3V RIGHT COMPARISON:  X-ray pelvis 03/13/2010 FINDINGS: Acute markedly superiorly and laterally displaced right intertrochanteric femoral fracture. No right hip dislocation. No acute displaced fracture or dislocation of the left hip. No acute displaced fracture or dislocation of the left hip on frontal view. No acute displaced fracture or diastasis of the bones of the pelvis. There is no evidence of arthropathy or other focal bone abnormality. Degenerative changes of the lower lumbar spine. IMPRESSION: Acute markedly superolaterally displaced right intertrochanteric femoral fracture. Electronically Signed   By: Morgane  Naveau M.D.   On: 11/11/2023 21:37   Microbiology: Results for orders placed or performed during the hospital encounter of 11/11/23  Urine Culture (for pregnant, neutropenic or urologic patients or patients with an indwelling urinary catheter)     Status: Abnormal   Collection Time: 11/12/23 12:12 PM   Specimen: Urine, Catheterized  Result Value Ref Range Status   Specimen Description   Final    URINE, CATHETERIZED Performed at [facility], [address], [place], [place] [address]    Special Requests   Final    NONE Performed at [facility], [address] [place], [place] [address]    Culture (A)  Final    10,000 COLONIES/mL ESCHERICHIA COLI Confirmed Extended Spectrum Beta-Lactamase Producer (ESBL).  In bloodstream infections from ESBL organisms, carbapenems are preferred over piperacillin/tazobactam. They are shown to have a lower risk of mortality.    Report Status 11/14/2023 FINAL  Final  Organism ID, Bacteria ESCHERICHIA COLI (A)  Final      Susceptibility   Escherichia coli - MIC*    AMPICILLIN >=32 RESISTANT Resistant     CEFAZOLIN  >=64 RESISTANT Resistant     CEFEPIME 16 RESISTANT Resistant     CEFTRIAXONE  >=64 RESISTANT Resistant     CIPROFLOXACIN  >=4 RESISTANT Resistant     GENTAMICIN <=1 SENSITIVE Sensitive     IMIPENEM <=0.25 SENSITIVE Sensitive     NITROFURANTOIN <=16 SENSITIVE Sensitive     TRIMETH/SULFA 80 RESISTANT Resistant     AMPICILLIN/SULBACTAM >=32 RESISTANT Resistant     PIP/TAZO <=4 SENSITIVE Sensitive ug/mL    * 10,000 COLONIES/mL ESCHERICHIA COLI  Culture, blood (Routine X 2) w Reflex to ID Panel     Status: None   Collection Time: 11/12/23 12:53 PM   Specimen: BLOOD LEFT HAND  Result Value Ref Range Status   Specimen Description   Final    BLOOD LEFT HAND Performed at [facility], [address] [place], [place] [address]    Special Requests   Final    BOTTLES DRAWN AEROBIC AND ANAEROBIC Blood Culture adequate volume Performed at [facility], [address], [place], [place] [address]    Culture   Final    NO GROWTH 5 DAYS Performed at [facility], [address] [place], [place] [address]    Report Status 11/17/2023 FINAL  Final  Culture, blood (Routine X 2) w Reflex to ID Panel     Status: Abnormal   Collection Time: 11/12/23 12:59 PM   Specimen: BLOOD  Result Value Ref Range Status   Specimen Description   Final    BLOOD Performed at [facility], [address], [place], [place] [address]    Special Requests    Final    BOTTLES DRAWN AEROBIC AND ANAEROBIC Blood Culture adequate volume Performed at [facility], [address], [place], [place] [address]    Culture  Setup Time   Final    GRAM NEGATIVE RODS ANAEROBIC BOTTLE ONLY PHARMD E JACKSON 11/14/2023 @ 0221 BY AB    Culture (A)  Final    BACTEROIDES UNIFORMIS BETA LACTAMASE POSITIVE Performed at [facility], [address], [place], [place] [address]    Report Status 11/15/2023 FINAL  Final  Blood Culture ID Panel (Reflexed)     Status: None   Collection Time: 11/12/23 12:59 PM  Result Value Ref Range Status   Enterococcus faecalis NOT DETECTED NOT DETECTED Final   Enterococcus Faecium NOT DETECTED NOT DETECTED Final   Listeria monocytogenes NOT DETECTED NOT DETECTED Final   Staphylococcus species NOT DETECTED NOT DETECTED Final   Staphylococcus aureus (BCID) NOT DETECTED NOT DETECTED Final   Staphylococcus epidermidis NOT DETECTED NOT DETECTED Final   Staphylococcus lugdunensis NOT DETECTED NOT DETECTED Final   Streptococcus species NOT DETECTED NOT DETECTED Final   Streptococcus agalactiae NOT DETECTED NOT DETECTED Final   Streptococcus pneumoniae NOT DETECTED NOT DETECTED Final   Streptococcus pyogenes NOT DETECTED NOT DETECTED Final   A.calcoaceticus-baumannii NOT DETECTED NOT DETECTED Final   Bacteroides fragilis NOT DETECTED NOT DETECTED Final   Enterobacterales NOT DETECTED NOT DETECTED Final   Enterobacter cloacae complex NOT DETECTED NOT DETECTED Final   Escherichia coli NOT DETECTED NOT DETECTED Final   Klebsiella aerogenes NOT DETECTED NOT DETECTED Final   Klebsiella oxytoca NOT DETECTED NOT DETECTED Final   Klebsiella pneumoniae NOT DETECTED NOT DETECTED Final  Proteus species NOT DETECTED NOT DETECTED Final   Salmonella species NOT DETECTED NOT DETECTED Final   Serratia marcescens NOT DETECTED NOT DETECTED Final   Haemophilus influenzae NOT DETECTED NOT DETECTED Final   Neisseria  meningitidis NOT DETECTED NOT DETECTED Final   Pseudomonas aeruginosa NOT DETECTED NOT DETECTED Final   Stenotrophomonas maltophilia NOT DETECTED NOT DETECTED Final   Candida albicans NOT DETECTED NOT DETECTED Final   Candida auris NOT DETECTED NOT DETECTED Final   Candida glabrata NOT DETECTED NOT DETECTED Final   Candida krusei NOT DETECTED NOT DETECTED Final   Candida parapsilosis NOT DETECTED NOT DETECTED Final   Candida tropicalis NOT DETECTED NOT DETECTED Final   Cryptococcus neoformans/gattii NOT DETECTED NOT DETECTED Final    Comment: Performed at [facility], [address], [place], [place] [address]  MRSA Next Gen by PCR, Nasal     Status: None   Collection Time: 11/12/23 10:48 PM   Specimen: Nasal Mucosa; Nasal Swab  Result Value Ref Range Status   MRSA by PCR Next Gen NOT DETECTED NOT DETECTED Final    Comment: (NOTE) The GeneXpert MRSA Assay (FDA approved for NASAL specimens only), is one component of a comprehensive MRSA colonization surveillance program. It is not intended to diagnose MRSA infection nor to guide or monitor treatment for MRSA infections. Test performance is not FDA approved in patients less than 33 years old. Performed at [facility], [address], [place], [place] [address]   C Difficile Quick Screen w PCR reflex     Status: None   Collection Time: 11/13/23  7:50 PM   Specimen: STOOL  Result Value Ref Range Status   C Diff antigen NEGATIVE NEGATIVE Final   C Diff toxin NEGATIVE NEGATIVE Final   C Diff interpretation No C. difficile detected.  Final    Comment: Performed at [facility], [address] [place], [place] [address]  Gastrointestinal Panel by PCR , Stool     Status: None   Collection Time: 11/14/23 10:29 AM   Specimen: Stool  Result Value Ref Range Status   Campylobacter species NOT DETECTED NOT DETECTED Final   Plesimonas shigelloides NOT DETECTED NOT DETECTED Final   Salmonella  species NOT DETECTED NOT DETECTED Final   Yersinia enterocolitica NOT DETECTED NOT DETECTED Final   Vibrio species NOT DETECTED NOT DETECTED Final   Vibrio cholerae NOT DETECTED NOT DETECTED Final   Enteroaggregative E coli (EAEC) NOT DETECTED NOT DETECTED Final   Enteropathogenic E coli (EPEC) NOT DETECTED NOT DETECTED Final   Enterotoxigenic E coli (ETEC) NOT DETECTED NOT DETECTED Final   Shiga like toxin producing E coli (STEC) NOT DETECTED NOT DETECTED Final   Shigella/Enteroinvasive E coli (EIEC) NOT DETECTED NOT DETECTED Final   Cryptosporidium NOT DETECTED NOT DETECTED Final   Cyclospora cayetanensis NOT DETECTED NOT DETECTED Final   Entamoeba histolytica NOT DETECTED NOT DETECTED Final   Giardia lamblia NOT DETECTED NOT DETECTED Final   Adenovirus F40/41 NOT DETECTED NOT DETECTED Final   Astrovirus NOT DETECTED NOT DETECTED Final   Norovirus GI/GII NOT DETECTED NOT DETECTED Final   Rotavirus A NOT DETECTED NOT DETECTED Final   Sapovirus (I, II, IV, and V) NOT DETECTED NOT DETECTED Final    Comment: Performed at [facility], [address] [place], [place] [address]   Labs: CBC: Recent Labs  Lab 11/15/23 0327 11/16/23 1020 11/17/23 0337 11/18/23 0316 11/19/23 0350  WBC 5.4 6.1 5.6 6.2 5.9  NEUTROABS 3.4 4.0 3.4 3.8 3.5  HGB 8.3* 10.1* 9.0* 9.7* 9.7*  HCT 25.0* 30.4* 28.4* 29.2* 30.2*  MCV 94.0 95.0 96.3 94.2 98.1  PLT 100* 159 161 215 233   Basic Metabolic Panel: Recent Labs  Lab 11/15/23 0327 11/16/23 1020 11/17/23 0337 11/18/23 0316 11/19/23 0350  NA 135 137 137 136 139  K 3.1* 4.1 4.0 4.2 4.2  CL 103 105 108 106 108  CO2 26 25 23 25 24   GLUCOSE 109* 160* 90 107* 117*  BUN 12 8 10 17 17   CREATININE 0.61 0.74 0.61 0.65 0.80  CALCIUM  6.6* 7.4* 7.2* 8.1* 7.9*  MG 1.9 1.9 1.9 2.0 2.1  PHOS 2.6 1.5* 2.5 3.3 3.3   Liver Function Tests: Recent Labs  Lab 11/15/23 0327 11/16/23 1020 11/17/23 0337 11/18/23 0316 11/19/23 0350  AST 26 37 34 45*  41  ALT 8 11 13 18 21   ALKPHOS 18* 23* 22* 25* 25*  BILITOT 0.8 1.0 1.3* 1.4* 1.2  PROT 3.9* 4.3* 4.1* 4.6* 4.5*  ALBUMIN  2.3* 2.4* 2.2* 2.4* 2.5*   CBG: No results for input(s): GLUCAP in the last 168 hours.  Discharge time spent: greater than 39 minutes.  Signed: Ivannah Zody, MD [facility] 11/20/2023

## 2023-12-11 ENCOUNTER — Ambulatory Visit: Admitting: Podiatry

## 2023-12-25 ENCOUNTER — Ambulatory Visit

## 2024-02-25 ENCOUNTER — Ambulatory Visit (INDEPENDENT_AMBULATORY_CARE_PROVIDER_SITE_OTHER)

## 2024-02-25 VITALS — Ht 60.0 in | Wt 123.0 lb

## 2024-02-25 DIAGNOSIS — B351 Tinea unguium: Secondary | ICD-10-CM

## 2024-02-25 NOTE — Progress Notes (Signed)
 This patient returns to the office for evaluation and treatment of long thick painful nails 1-5 b/l.  This patient is unable to trim her own nails since the patient cannot reach the feet. 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  She returns for preventive foot care services.  General Appearance  Alert, conversant and in no acute stress.  Vascular  Dorsalis pedis and posterior tibial  pulses are palpable  bilaterally.  Capillary return is within normal limits  bilaterally. Temperature is within normal limits  bilaterally.  Neurologic  Senn-Weinstein monofilament wire test within normal limits  bilaterally. Muscle power within normal limits bilaterally.  Nails Thick disfigured discolored nails with subungual debris toes 1-5 b/l. Pincer nails hallux bilateral feet.   No evidence of bacterial infection or drainage bilaterally.  No redness or swelling or pain.  Orthopedic  No limitations of motion  feet.  No crepitus or effusions noted.  No bony pathology or digital deformities noted.   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  Skin normotropic skin with no porokeratosis noted bilaterally.  No signs of infections or ulcers noted.     Onychomycosis  Pain in toes bilateral foot      Debridement of nail 1-5 toenails bilateral foot. with a nail nipper.  Nails were then filed using a dremel tool with no incidents. I provided her a size small silicone toe sleeve for her right 2nd toe to decrease rubbing.   RTC  10   weeks with Dr. Loreda, DPM  Prentice Ovens, DPM

## 2024-05-06 ENCOUNTER — Encounter: Payer: Self-pay | Admitting: Podiatry

## 2024-05-06 ENCOUNTER — Ambulatory Visit: Admitting: Podiatry

## 2024-05-06 DIAGNOSIS — B351 Tinea unguium: Secondary | ICD-10-CM | POA: Diagnosis not present

## 2024-05-06 NOTE — Progress Notes (Signed)
 This patient returns to the office for evaluation and treatment of long thick painful nails second right foot..   .  This patient is unable to trim her own nails since the patient cannot reach the feet.  Patient says the nails are   painful walking and wearing her shoes.  She returns for preventive foot care services.  General Appearance  Alert, conversant and in no acute stress.  Vascular  Dorsalis pedis and posterior tibial  pulses are  weakly palpable  bilaterally.  Capillary return is within normal limits  bilaterally. Temperature is within normal limits  bilaterally.  Neurologic  Senn-Weinstein monofilament wire test within normal limits  bilaterally. Muscle power within normal limits bilaterally.  Nails Thick disfigured discolored nails with subungual debris  second toenail right foot. Pincer nails hallux Right  Foot..   No evidence of bacterial infection or drainage bilaterally.  No redness or swelling or pain.  Orthopedic  No limitations of motion  feet .  No crepitus or effusions noted.  No bony pathology or digital deformities noted.   HAV  right foot.  Skin  normotropic skin with no porokeratosis noted bilaterally.  No signs of infections or ulcers noted.     Onychomycosis  Pain in toes right foot      Debridement  of nail second and fifth  toenails right foot. with a nail nipper.  Nails were then filed using a dremel tool with no incidents.  In future file first and then use nail nipper.  RTC  9   weeks     Helane Gunther DPM

## 2024-07-08 ENCOUNTER — Ambulatory Visit: Admitting: Podiatry

## 2024-07-08 ENCOUNTER — Encounter: Payer: Self-pay | Admitting: Podiatry

## 2024-07-08 DIAGNOSIS — B351 Tinea unguium: Secondary | ICD-10-CM

## 2024-07-08 NOTE — Progress Notes (Signed)
 This patient returns to the office for evaluation and treatment of long thick painful nails second right foot..   .  This patient is unable to trim her own nails since the patient cannot reach the feet.  Patient says the nails are   painful walking and wearing her shoes.  She returns for preventive foot care services.  General Appearance  Alert, conversant and in no acute stress.  Vascular  Dorsalis pedis and posterior tibial  pulses are  weakly palpable  bilaterally.  Capillary return is within normal limits  bilaterally. Temperature is within normal limits  bilaterally.  Neurologic  Senn-Weinstein monofilament wire test within normal limits  bilaterally. Muscle power within normal limits bilaterally.  Nails Thick disfigured discolored nails with subungual debris  second toenail right foot. Pincer nails hallux Right  Foot..   No evidence of bacterial infection or drainage bilaterally.  No redness or swelling or pain.  Orthopedic  No limitations of motion  feet .  No crepitus or effusions noted.  No bony pathology or digital deformities noted.   HAV  right foot.  Skin  normotropic skin with no porokeratosis noted bilaterally.  No signs of infections or ulcers noted.     Onychomycosis  Pain in toes right foot      Debridement  of nail second and fifth  toenails right foot. with a nail nipper.  Nails were then filed using a dremel tool with no incidents.  In future file first and then use nail nipper.  RTC  9   weeks     Cordella Bold DPM [facility]

## 2024-09-09 ENCOUNTER — Ambulatory Visit: Admitting: Podiatry
